# Patient Record
Sex: Male | Born: 1945
Health system: Southern US, Community
[De-identification: ages and names within clinical notes are randomized; demographics above are authoritative.]

## PROBLEM LIST (undated history)

## (undated) DIAGNOSIS — D649 Anemia, unspecified: Secondary | ICD-10-CM

## (undated) DIAGNOSIS — E785 Hyperlipidemia, unspecified: Secondary | ICD-10-CM

## (undated) DIAGNOSIS — I10 Essential (primary) hypertension: Secondary | ICD-10-CM

## (undated) DIAGNOSIS — J449 Chronic obstructive pulmonary disease, unspecified: Secondary | ICD-10-CM

## (undated) DIAGNOSIS — I219 Acute myocardial infarction, unspecified: Secondary | ICD-10-CM

## (undated) DIAGNOSIS — E039 Hypothyroidism, unspecified: Secondary | ICD-10-CM

## (undated) DIAGNOSIS — I2510 Atherosclerotic heart disease of native coronary artery without angina pectoris: Secondary | ICD-10-CM

## (undated) DIAGNOSIS — K219 Gastro-esophageal reflux disease without esophagitis: Secondary | ICD-10-CM

## (undated) DIAGNOSIS — I499 Cardiac arrhythmia, unspecified: Secondary | ICD-10-CM

## (undated) DIAGNOSIS — G4730 Sleep apnea, unspecified: Secondary | ICD-10-CM

## (undated) HISTORY — DX: Anemia, unspecified: D64.9

## (undated) HISTORY — DX: Hyperlipidemia, unspecified: E78.5

## (undated) HISTORY — PX: TONSILLECTOMY: SUR1361

## (undated) HISTORY — DX: Cardiac arrhythmia, unspecified: I49.9

## (undated) HISTORY — DX: Chronic obstructive pulmonary disease, unspecified: J44.9

## (undated) HISTORY — DX: Gastro-esophageal reflux disease without esophagitis: K21.9

## (undated) HISTORY — DX: Atherosclerotic heart disease of native coronary artery without angina pectoris: I25.10

## (undated) HISTORY — PX: APPENDECTOMY: SHX54

## (undated) HISTORY — DX: Acute myocardial infarction, unspecified: I21.9

## (undated) HISTORY — DX: Sleep apnea, unspecified: G47.30

## (undated) HISTORY — PX: EYE SURGERY: SHX253

## (undated) HISTORY — DX: Essential (primary) hypertension: I10

## (undated) HISTORY — DX: Hypothyroidism, unspecified: E03.9

## (undated) HISTORY — PX: CORONARY ANGIOPLASTY WITH STENT PLACEMENT: SHX49

---

## 2004-12-08 ENCOUNTER — Ambulatory Visit: Payer: Self-pay | Admitting: Critical Care Medicine

## 2004-12-25 ENCOUNTER — Ambulatory Visit (HOSPITAL_BASED_OUTPATIENT_CLINIC_OR_DEPARTMENT_OTHER): Admission: RE | Admit: 2004-12-25 | Discharge: 2004-12-25 | Payer: Self-pay | Admitting: Critical Care Medicine

## 2005-01-08 ENCOUNTER — Ambulatory Visit: Payer: Self-pay | Admitting: Pulmonary Disease

## 2005-03-18 ENCOUNTER — Encounter: Admission: RE | Admit: 2005-03-18 | Discharge: 2005-03-18 | Payer: Self-pay | Admitting: Gastroenterology

## 2005-06-24 ENCOUNTER — Ambulatory Visit (HOSPITAL_COMMUNITY): Admission: RE | Admit: 2005-06-24 | Discharge: 2005-06-25 | Payer: Self-pay | Admitting: Otolaryngology

## 2006-01-08 ENCOUNTER — Encounter: Admission: RE | Admit: 2006-01-08 | Discharge: 2006-01-08 | Payer: Self-pay | Admitting: Family Medicine

## 2008-05-15 ENCOUNTER — Ambulatory Visit (HOSPITAL_COMMUNITY): Admission: RE | Admit: 2008-05-15 | Discharge: 2008-05-16 | Payer: Self-pay | Admitting: Ophthalmology

## 2009-08-03 DIAGNOSIS — I2510 Atherosclerotic heart disease of native coronary artery without angina pectoris: Secondary | ICD-10-CM

## 2009-08-03 DIAGNOSIS — I219 Acute myocardial infarction, unspecified: Secondary | ICD-10-CM

## 2009-08-03 HISTORY — DX: Atherosclerotic heart disease of native coronary artery without angina pectoris: I25.10

## 2009-08-03 HISTORY — DX: Acute myocardial infarction, unspecified: I21.9

## 2010-01-13 ENCOUNTER — Inpatient Hospital Stay (HOSPITAL_COMMUNITY): Admission: EM | Admit: 2010-01-13 | Discharge: 2010-01-16 | Payer: Self-pay | Admitting: Emergency Medicine

## 2010-01-30 ENCOUNTER — Ambulatory Visit: Payer: Self-pay | Admitting: Cardiovascular Disease

## 2010-02-10 ENCOUNTER — Ambulatory Visit: Payer: Self-pay | Admitting: Cardiovascular Disease

## 2010-02-26 ENCOUNTER — Encounter: Payer: Self-pay | Admitting: Cardiovascular Disease

## 2010-03-12 ENCOUNTER — Ambulatory Visit: Payer: Self-pay | Admitting: Cardiovascular Disease

## 2010-05-07 ENCOUNTER — Ambulatory Visit: Payer: Self-pay | Admitting: Cardiovascular Disease

## 2010-07-21 ENCOUNTER — Ambulatory Visit: Payer: Self-pay | Admitting: Cardiovascular Disease

## 2010-09-02 NOTE — Miscellaneous (Signed)
Summary: Enrollment  Enrollment   Imported By: Marylou Mccoy 02/26/2010 12:47:04  _____________________________________________________________________  External Attachment:    Type:   Image     Comment:   External Document

## 2010-10-19 LAB — BASIC METABOLIC PANEL
CO2: 30 mEq/L (ref 19–32)
Calcium: 9.1 mg/dL (ref 8.4–10.5)
Calcium: 9.6 mg/dL (ref 8.4–10.5)
Chloride: 101 mEq/L (ref 96–112)
Chloride: 102 mEq/L (ref 96–112)
Creatinine, Ser: 1.07 mg/dL (ref 0.4–1.5)
Creatinine, Ser: 1.09 mg/dL (ref 0.4–1.5)
GFR calc Af Amer: >60 mL/min (ref >60)
GFR calc non Af Amer: >60 mL/min (ref >60)
Glucose, Bld: 103 mg/dL — ABNORMAL HIGH (ref 70–99)
Sodium: 138 mEq/L (ref 135–145)

## 2010-10-19 LAB — CBC
HCT: 40.2 % (ref 39.0–52.0)
HCT: 41.8 % (ref 39.0–52.0)
Hemoglobin: 13.9 g/dL (ref 13.0–17.0)
MCHC: 34.5 g/dL (ref 30.0–36.0)
MCV: 94.3 fL (ref 78.0–100.0)
Platelets: 169 10*3/uL (ref 150–400)
RBC: 4.45 MIL/uL (ref 4.22–5.81)
RDW: 11.7 % (ref 11.5–15.5)
RDW: 11.9 % (ref 11.5–15.5)
WBC: 5.5 10*3/uL (ref 4.0–10.5)
WBC: 5.7 10*3/uL (ref 4.0–10.5)

## 2010-10-20 LAB — URINALYSIS, ROUTINE W REFLEX MICROSCOPIC
Bilirubin Urine: NEGATIVE
Glucose, UA: NEGATIVE mg/dL
Hgb urine dipstick: NEGATIVE
Ketones, ur: NEGATIVE mg/dL
Specific Gravity, Urine: 1.02 (ref 1.005–1.030)

## 2010-10-20 LAB — LIPID PANEL
Cholesterol: 166 mg/dL (ref 0–200)
HDL: 27 mg/dL — ABNORMAL LOW (ref >39)
Total CHOL/HDL Ratio: 6.1 RATIO
Triglycerides: 113 mg/dL (ref <150)

## 2010-10-20 LAB — HEMOGLOBIN A1C: Mean Plasma Glucose: 128 mg/dL — ABNORMAL HIGH (ref <117)

## 2010-10-20 LAB — CARDIAC PANEL(CRET KIN+CKTOT+MB+TROPI)
CK, MB: 6.5 ng/mL (ref 0.3–4.0)
CK, MB: 7.5 ng/mL (ref 0.3–4.0)
Relative Index: 2.5 (ref 0.0–2.5)
Relative Index: 2.6 — ABNORMAL HIGH (ref 0.0–2.5)
Total CK: 261 U/L — ABNORMAL HIGH (ref 7–232)
Troponin I: 4.14 ng/mL (ref 0.00–0.06)

## 2010-10-20 LAB — BASIC METABOLIC PANEL
Calcium: 9.5 mg/dL (ref 8.4–10.5)
Chloride: 103 mEq/L (ref 96–112)
GFR calc Af Amer: >60 mL/min (ref >60)

## 2010-10-20 LAB — CBC
HCT: 44.3 % (ref 39.0–52.0)
MCHC: 34.7 g/dL (ref 30.0–36.0)
MCV: 94.3 fL (ref 78.0–100.0)
RBC: 4.45 MIL/uL (ref 4.22–5.81)
RBC: 4.7 MIL/uL (ref 4.22–5.81)
WBC: 6.1 10*3/uL (ref 4.0–10.5)

## 2010-10-20 LAB — CK TOTAL AND CKMB (NOT AT ARMC)
CK, MB: 9.7 ng/mL (ref 0.3–4.0)
Relative Index: 2.8 — ABNORMAL HIGH (ref 0.0–2.5)

## 2010-10-20 LAB — PROTIME-INR
INR: 1.07 (ref 0.00–1.49)
Prothrombin Time: 13.8 seconds (ref 11.6–15.2)

## 2010-10-20 LAB — DIFFERENTIAL
Basophils Relative: 1 % (ref 0–1)
Eosinophils Absolute: 0.2 10*3/uL (ref 0.0–0.7)
Lymphocytes Relative: 21 % (ref 12–46)
Lymphs Abs: 1.3 10*3/uL (ref 0.7–4.0)
Neutro Abs: 3.8 10*3/uL (ref 1.7–7.7)

## 2010-10-20 LAB — TSH: TSH: 4.096 u[IU]/mL (ref 0.350–4.500)

## 2010-10-20 LAB — POCT I-STAT, CHEM 8
BUN: 20 mg/dL (ref 6–23)
Calcium, Ion: 1.15 mmol/L (ref 1.12–1.32)
Chloride: 104 mEq/L (ref 96–112)
Glucose, Bld: 107 mg/dL — ABNORMAL HIGH (ref 70–99)
HCT: 45 % (ref 39.0–52.0)
Sodium: 137 mEq/L (ref 135–145)

## 2010-10-20 LAB — APTT: aPTT: 33 seconds (ref 24–37)

## 2010-10-20 LAB — TROPONIN I: Troponin I: 2.66 ng/mL (ref 0.00–0.06)

## 2010-10-20 LAB — POCT CARDIAC MARKERS
CKMB, poc: 7.4 ng/mL (ref 1.0–8.0)
Troponin i, poc: 1.05 ng/mL (ref 0.00–0.09)

## 2010-10-20 LAB — BRAIN NATRIURETIC PEPTIDE: Pro B Natriuretic peptide (BNP): 119 pg/mL — ABNORMAL HIGH (ref 0.0–100.0)

## 2010-12-16 NOTE — Op Note (Signed)
Kyle [name], Kyle [name]               ACCOUNT NO.:  [id number]   MEDICAL RECORD NO.:  [id number]          PATIENT TYPE:  AMB   LOCATION:  SDS                          FACILITY:  [facility]   PHYSICIAN:  John D. Ashley Royalty, M.D. DATE OF BIRTH:  [birth year]   DATE OF PROCEDURE:  05/15/2008  DATE OF DISCHARGE:                               OPERATIVE REPORT   ADMISSION DIAGNOSES:  Recurrent rhegmatogenous retinal detachment, left  eye.  Also, proliferative vitreoretinopathy.   PROCEDURES:  Pars plana vitrectomy with membrane peel, Perfluoron  placement, retinal photocoagulation, Perfluoron removal, gas fluid  exchange, perfluoropropane injection, left eye.   SURGEON:  Beulah Gandy. Ashley Royalty, MD   ASSISTANT:  Rosalie Doctor, MA.   ANESTHESIA:  General.   DETAILS:  Usual Prep and drape.  Peritomies at 10, 2, and 4 o'clock.  A  5-mm infusion port placed at 4 o'clock.  The lighted pick and the cutter  were placed at 10 and 2 o'clock respectively.  Three layered sclerotomy  wounds were made at 10, 2, and 4 o'clock.  Provisc was placed on the  corneal surface.  Pars plana vitrectomy was begun just behind the  pseudophakos.  The dense membranes were encountered and removed under  low suction and rapid cutting.  The vitrectomy was carried out to the  periphery where the vitreous base was trimmed.  The vitrectomy was  carried down to the macular surface where membranes were peeled from  their attachments to the retina.  Once the retina was found to be free,  Perfluoron was placed for full reattachment of the retina.  Four rows of  endolaser were placed from 12 o'clock to 6 o'clock on the posterior  aspect of the buckle.  The 1060 burns were placed, power was 1500  milliwatts, 1000 microns each and 0.1 seconds each.  Perfluoron was  removed.  A washout procedure was performed with BSS so that all  Perfluoron was removed.  Perfluoropropane in a 14% concentration was  exchanged for intravitreal gas.  The  instruments were removed from the  eye and 9-0 nylon was used to close the sclerotomy sites.  Pressure was  adjusted to 10 with a Baer keratometer with additional perfluoropropane  injection.  The wounds were tested and found to be tight.  The  conjunctiva was closed with wet-field cautery.  Polymyxin and gentamicin  were irrigated 15 ounces space.  Atropine solution was applied.  Decadron 10 mg was injected to the lower subconjunctival space.  Marcaine was injected around the globe for postop pain.  Polysporin  ophthalmic ointment, a patch and shield were placed.  Closing pressure  was 10 with a Risk manager.   COMPLICATIONS:  None.   DURATION:  45 minutes.   The patient was awakened and taken to recovery in satisfactory  condition.      Beulah Gandy. Ashley Royalty, M.D.  Electronically Signed     JDM/MEDQ  D:  05/15/2008  T:  05/16/2008  Job:  [id number]

## 2010-12-19 NOTE — Procedures (Signed)
[name], [name] NO.:  [id number]   MEDICAL RECORD NO.:  [id number]          PATIENT TYPE:  OUT   LOCATION:  SLEEP CENTER                 FACILITY:  [facility]   PHYSICIAN:  Marcelyn Bruins, M.D. [facility] DATE OF BIRTH:  August 16, 1945   DATE OF STUDY:  12/25/2004                              NOCTURNAL POLYSOMNOGRAM   REFERRING PHYSICIAN:  Shan Levans, M.D.   INDICATION FOR THE STUDY:  Hypersomnia with sleep apnea. Epworth score was  10.   SLEEP ARCHITECTURE:  The patient had a total sleep time of 408 minutes with  very diminished slow wave sleep, but adequate REM. Sleep onset latency was  normal and REM latency was mildly prolonged. Sleep efficiency was 86%.   IMPRESSION:  1.  Severe obstructive sleep apnea/hypopnea syndrome with a respiratory      disturbance index of 46 events per hour and O2 desaturation as low as      86%. The events were clearly worse in the supine position. The patient      did have some documented central apneas, however, most of these scored      were in actuality mixed apneas.  2.  Moderate to severe snoring noted throughout the study.  3.  The patient occasionally had 5-beat runs of wide complex tachycardia      that were not associated with obstructive events. Otherwise there were      no clinically significant cardiac arrhythmias.     ______________________________  Suzzette Righter    KC/MEDQ  D:  01/08/2005 11:39:35  T:  01/08/2005 12:05:42  Job:  [id number]

## 2010-12-19 NOTE — Op Note (Signed)
NAMEADARIUS, TIGGES NO.:  [id number]   MEDICAL RECORD NO.:  [id number]          PATIENT TYPE:  OIB   LOCATION:  5742                         FACILITY:  [facility]   PHYSICIAN:  Beulah Gandy. Ashley Royalty, M.D. DATE OF BIRTH:  April 09, 1946   DATE OF PROCEDURE:  06/24/2005  DATE OF DISCHARGE:                                 OPERATIVE REPORT   ADMISSION DIAGNOSIS:  Rhegmatogenous retinal detachment in the left eye.   PROCEDURES:  1.  Scleral buckle, left eye.  2.  Retinal photocoagulation, left eye.   SURGEON:  Beulah Gandy. Ashley Royalty, M.D.   ASSISTANT:  Ruthann Cancer, R.N.   ANESTHESIA:  General.   DETAILS:  Usual prep and drape, 360-degree limbal peritomy, isolation of 4  rectus muscles on 2-0 silk, scleral dissection for 360 degrees, diathermy  placed in the bed, 2 sutures per quadrant placed in the scleral flaps for a  total of 8 sutures, perforation site chosen at 2:30 o'clock in the posterior  aspect of the bed; a large amount of clear colorless subretinal fluid came  forth in a controlled manner.  Pigment granules came forth at the end of the  drainage.  The scleral sutures were drawn securely.  A 279 implant was  placed around the eye with 1 mm trimmed from the anterior edge.  A 240 band  was placed around the eye with a 270 sleeve at 10 o'clock.  The buckle was  jointed at 10 o'clock.  The buckle was drawn securely and knotted.  Indirect  ophthalmoscopy showed the retina to be lying nicely on the scleral buckle  with no subretinal fluid remaining.  The indirect ophthalmoscope laser was  moved into place and 716 burns were placed around the retinal periphery on  the scleral buckle; the power was 460 milliwatts, 1000 microns each and 0.1  seconds each.  The band was adjusted and trimmed.  The buckle was adjusted  and trimmed.  The sutures were knotted and the free ends trimmed.  The  conjunctiva was reapproximated with 7-0 chromic suture.  Polymyxin and  gentamicin were  irrigated into Tenon's space.  No atropine solution was  used.  Decadron 10 mg was injected into the lower subconjunctival space.  Marcaine was injected around the globe for postop pain.  TobraDex ophthalmic  ointment was placed.  Closing pressure was 10 with a [place] keratometer.  A patch and shield were placed.  The patient was awakened and taken to  recovery in satisfactory condition.   COMPLICATIONS:  None.   DURATION:  Two hours.      Beulah Gandy. Ashley Royalty, M.D.  Electronically Signed    JDM/MEDQ  D:  06/24/2005  T:  06/25/2005  Job:  [id number]

## 2011-02-19 ENCOUNTER — Encounter (INDEPENDENT_AMBULATORY_CARE_PROVIDER_SITE_OTHER): Payer: Self-pay

## 2011-02-19 DIAGNOSIS — R0989 Other specified symptoms and signs involving the circulatory and respiratory systems: Secondary | ICD-10-CM

## 2011-05-04 LAB — BASIC METABOLIC PANEL
CO2: 27
Calcium: 10.5
Creatinine, Ser: 0.95
GFR calc Af Amer: >60
GFR calc non Af Amer: >60
Glucose, Bld: 98

## 2011-05-04 LAB — CBC
MCHC: 33.6
RDW: 12.2

## 2011-12-04 ENCOUNTER — Ambulatory Visit: Payer: Self-pay | Admitting: Family Medicine

## 2011-12-04 DIAGNOSIS — Z0289 Encounter for other administrative examinations: Secondary | ICD-10-CM

## 2011-12-11 ENCOUNTER — Ambulatory Visit (INDEPENDENT_AMBULATORY_CARE_PROVIDER_SITE_OTHER): Payer: BC Managed Care – PPO | Admitting: Family Medicine

## 2011-12-11 ENCOUNTER — Encounter: Payer: Self-pay | Admitting: Family Medicine

## 2011-12-11 VITALS — BP 122/72 | HR 80 | Temp 97.8°F | Resp 12 | Ht 71.0 in | Wt 232.0 lb

## 2011-12-11 DIAGNOSIS — I10 Essential (primary) hypertension: Secondary | ICD-10-CM | POA: Insufficient documentation

## 2011-12-11 DIAGNOSIS — E785 Hyperlipidemia, unspecified: Secondary | ICD-10-CM | POA: Insufficient documentation

## 2011-12-11 DIAGNOSIS — I2510 Atherosclerotic heart disease of native coronary artery without angina pectoris: Secondary | ICD-10-CM | POA: Insufficient documentation

## 2011-12-11 LAB — LIPID PANEL
Cholesterol: 98 mg/dL (ref 0–200)
Triglycerides: 92 mg/dL (ref 0.0–149.0)

## 2011-12-11 LAB — HEPATIC FUNCTION PANEL
ALT: 23 U/L (ref 0–53)
AST: 27 U/L (ref 0–37)
Albumin: 4.1 g/dL (ref 3.5–5.2)
Alkaline Phosphatase: 51 U/L (ref 39–117)
Total Protein: 7.2 g/dL (ref 6.0–8.3)

## 2011-12-11 LAB — BASIC METABOLIC PANEL
Calcium: 9.5 mg/dL (ref 8.4–10.5)
Creatinine, Ser: 1.1 mg/dL (ref 0.4–1.5)
GFR: 72.94 mL/min (ref >60.00)

## 2011-12-11 NOTE — Progress Notes (Signed)
  Subjective:    Patient ID: Kyle [name], male    DOB: [birth year], 66 y.o.   MRN: [id number]  HPI  New patient to establish care. Patient was basically doing well with no chronic problems until about 2 years ago when he developed chest pain. Reportedly had cardiac stent placed 2011. Ongoing followup with cardiology since then. Also treated for hypertension and hyperlipidemia. Medications reviewed. Currently takes ACE inhibitor and statin along with baby aspirin. Had been on metoprolol for about one year following his MI but currently off. No recent chest pain. He is an ex-smoker previously up to 4 packs per day. Denies any recent progressive dyspnea. No orthopnea. He's had some dry cough for the past week the wife has similar type cough. No hemoptysis.  Patient recently married. Works as a Nutritional therapist. Ex-smoker. Occasional alcohol use. Family history significant for both parents with history of hypertension and stroke.  Past Medical History  Diagnosis Date  . GERD (gastroesophageal reflux disease)   . Cardiac arrhythmia   . Hyperlipidemia   . Hypertension    Past Surgical History  Procedure Date  . Appendectomy   . Eye surgery     lasix    reports that he quit smoking about 28 years ago. His smoking use included Cigarettes. He has a 40 pack-year smoking history. He does not have any smokeless tobacco history on file. His alcohol and drug histories not on file. family history includes Stroke in his father and mother. No Known Allergies    Review of Systems  Constitutional: Negative for fever, chills and unexpected weight change.  Eyes: Negative for visual disturbance.  Respiratory: Positive for shortness of breath (Chronic).   Cardiovascular: Negative for chest pain, palpitations and leg swelling.  Gastrointestinal: Negative for nausea, vomiting, abdominal pain and blood in stool.  Genitourinary: Negative for dysuria.  Neurological: Negative for dizziness and syncope.    Hematological: Negative for adenopathy. Does not bruise/bleed easily.  Psychiatric/Behavioral: Negative for confusion.       Objective:   Physical Exam  Constitutional: He appears well-developed and well-nourished. No distress.  HENT:  Right Ear: External ear normal.  Left Ear: External ear normal.  Mouth/Throat: Oropharynx is clear and moist.  Neck: Neck supple. No thyromegaly present.  Cardiovascular: Normal rate and regular rhythm.   Pulmonary/Chest: Effort normal and breath sounds normal. No respiratory distress. He has no wheezes. He has no rales.  Lymphadenopathy:    He has no cervical adenopathy.          Assessment & Plan:  #1 history of coronary artery disease. Recheck lipid and hepatic panel  #2 hypertension well controlled. Check basic metabolic panel  #3 dyslipidemia  #4 history of GERD  #5 ex-smoker. Probable COPD. Discussed further evaluation including spirometry this point he wishes to wait

## 2011-12-14 NOTE — Progress Notes (Signed)
Quick Note:  Pt informed ______ 

## 2011-12-17 ENCOUNTER — Telehealth: Payer: Self-pay | Admitting: *Deleted

## 2011-12-17 NOTE — Telephone Encounter (Signed)
Notified pt can take Tessalon.

## 2011-12-17 NOTE — Telephone Encounter (Signed)
Pt is having a dry and hacky cough x several weeks now.  His wife had it first and needs RX.  She is taking Lawyer and if this is ok, he will try that .

## 2011-12-17 NOTE — Telephone Encounter (Signed)
Okay to try [employer]

## 2012-02-26 ENCOUNTER — Encounter (INDEPENDENT_AMBULATORY_CARE_PROVIDER_SITE_OTHER): Payer: Medicare Other

## 2012-02-26 DIAGNOSIS — R0989 Other specified symptoms and signs involving the circulatory and respiratory systems: Secondary | ICD-10-CM

## 2012-05-17 ENCOUNTER — Other Ambulatory Visit (INDEPENDENT_AMBULATORY_CARE_PROVIDER_SITE_OTHER): Payer: Medicare Other | Admitting: *Deleted

## 2012-05-17 ENCOUNTER — Ambulatory Visit (INDEPENDENT_AMBULATORY_CARE_PROVIDER_SITE_OTHER): Payer: Medicare Other

## 2012-05-17 DIAGNOSIS — Z23 Encounter for immunization: Secondary | ICD-10-CM

## 2012-06-14 ENCOUNTER — Ambulatory Visit: Payer: BC Managed Care – PPO | Admitting: Family Medicine

## 2012-07-06 ENCOUNTER — Ambulatory Visit: Payer: Self-pay | Admitting: Family Medicine

## 2012-10-11 ENCOUNTER — Ambulatory Visit (INDEPENDENT_AMBULATORY_CARE_PROVIDER_SITE_OTHER): Payer: Medicare Other | Admitting: Family

## 2012-10-11 ENCOUNTER — Encounter: Payer: Self-pay | Admitting: Family

## 2012-10-11 ENCOUNTER — Ambulatory Visit (INDEPENDENT_AMBULATORY_CARE_PROVIDER_SITE_OTHER)
Admission: RE | Admit: 2012-10-11 | Discharge: 2012-10-11 | Disposition: A | Discharge status: Home / Self Care | Payer: Medicare Other | Source: Ambulatory Visit | Attending: Family | Admitting: Family

## 2012-10-11 VITALS — BP 132/88 | HR 75 | Temp 97.3°F | Wt 229.0 lb

## 2012-10-11 DIAGNOSIS — R05 Cough: Secondary | ICD-10-CM

## 2012-10-11 DIAGNOSIS — R059 Cough, unspecified: Secondary | ICD-10-CM

## 2012-10-11 DIAGNOSIS — J209 Acute bronchitis, unspecified: Secondary | ICD-10-CM

## 2012-10-11 MED ORDER — PREDNISONE 20 MG PO TABS: ORAL_TABLET | ORAL | Status: AC

## 2012-10-11 NOTE — Patient Instructions (Signed)

## 2012-10-11 NOTE — Progress Notes (Signed)
Subjective:    Patient ID: Kyle [name], male    DOB: Jul 22, 1946, 67 y.o.   MRN: [id number]  HPI 67 year old white male, nonsmoker, patient of Dr. Caryl Never is in today with complaints of cough, chest congestion x2 weeks. Reports mild occasional shortness of breath. Has been taking Robitussin that he believes makes his symptoms worse. Reports having a wheezing particularly at night. Denies any fever. Denies any heartburn or indigestion. If stable on blood pressure medication.   Review of Systems  Constitutional: Negative.   HENT: Negative.   Respiratory: Positive for cough, shortness of breath and wheezing.   Cardiovascular: Negative.  Negative for chest pain, palpitations and leg swelling.  Gastrointestinal: Negative.  Negative for abdominal pain.  Genitourinary: Negative.   Musculoskeletal: Negative.   Skin: Negative.   Allergic/Immunologic: Negative.   Neurological: Negative.   Hematological: Negative.   Psychiatric/Behavioral: Negative.    Past Medical History  Diagnosis Date  . GERD (gastroesophageal reflux disease)   . Cardiac arrhythmia   . Hyperlipidemia   . Hypertension   . CAD (coronary artery disease) 2011    History   Social History  . Marital Status: Married    Spouse Name: N/A    Number of Children: N/A  . Years of Education: N/A   Occupational History  . Not on file.   Social History Main Topics  . Smoking status: Former Smoker -- 2.00 packs/day for 20 years    Types: Cigarettes    Quit date: 12/11/1983  . Smokeless tobacco: Not on file  . Alcohol Use: Not on file  . Drug Use: Not on file  . Sexually Active: Not on file   Other Topics Concern  . Not on file   Social History Narrative  . No narrative on file    Past Surgical History  Procedure Laterality Date  . Appendectomy    . Eye surgery      lasix    Family History  Problem Relation Age of Onset  . Stroke Mother   . Stroke Father     No Known Allergies  Current Outpatient  Prescriptions on File Prior to Visit  Medication Sig Dispense Refill  . aspirin 81 MG tablet Take 81 mg by mouth daily.      . benazepril-hydrochlorthiazide (LOTENSIN HCT) 20-25 MG per tablet Take 1 tablet by mouth daily.      . cetirizine (ZYRTEC) 10 MG tablet Take 10 mg by mouth daily.      . metoprolol (LOPRESSOR) 50 MG tablet Take 50 mg by mouth 2 (two) times daily.      Marland Kitchen omeprazole (PRILOSEC OTC) 20 MG tablet Take 20 mg by mouth daily.      . simvastatin (ZOCOR) 40 MG tablet Take 40 mg by mouth every evening.       No current facility-administered medications on file prior to visit.    BP 132/88  Pulse 75  Temp(Src) 97.3 F (36.3 C) (Oral)  Wt 229 lb (103.874 kg)  BMI 31.95 kg/m2  SpO2 95%chart    Objective:   Physical Exam  Constitutional: He is oriented to person, place, and time. He appears well-developed and well-nourished.  HENT:  Right Ear: External ear normal.  Left Ear: External ear normal.  Nose: Nose normal.  Mouth/Throat: Oropharynx is clear and moist.  Neck: Normal range of motion. Neck supple.  Cardiovascular: Normal rate, regular rhythm and normal heart sounds.   Pulmonary/Chest: Effort normal and breath sounds normal. He has no wheezes.  Abdominal: Soft. Bowel sounds are normal.  Musculoskeletal: Normal range of motion.  Neurological: He is alert and oriented to person, place, and time.  Skin: Skin is warm and dry.  Psychiatric: He has a normal mood and affect.          Assessment & Plan:  Assessment:  1. Acute bronchitis 2. Cough 3. Hypertension  Plan: Prednisone 60x3, 40x3, 20x3. Rest. Drink plenty of fluids. Patient call the office if his symptoms worsen or persist. Recheck a schedule, and as needed.

## 2012-10-13 ENCOUNTER — Telehealth: Payer: Self-pay | Admitting: Family Medicine

## 2012-10-13 MED ORDER — ALBUTEROL SULFATE HFA 108 (90 BASE) MCG/ACT IN AERS: 2.0000 | INHALATION_SPRAY | Freq: Four times a day (QID) | RESPIRATORY_TRACT | Status: DC | PRN

## 2012-10-13 NOTE — Telephone Encounter (Signed)
Wife [name], [phone].   states her husband was in the office this week with bronchitis and placed on a Prednisone Taper.  The office called yesterday 10/12/12 with his x-ray results and told patient they were calling him in an inhaler.  Wife states medication is not at their pharmacy [address] [place] ([address]) [phone].  Reviewed Epic. Note on x-ray imaging reflects inhaler to be called.  No medication (Inhaler) on patient medication list.  Please follow up with patient concerning medication .  MEDICATION NOT AT PHARMACY

## 2012-10-13 NOTE — Telephone Encounter (Signed)
Pt saw Padonda on 3/11, please see CXR notes and advise.

## 2012-10-13 NOTE — Telephone Encounter (Signed)
Lets call in ProAir 2 puffs every 6 hours prn cough and wheeze and needs follow up next few days if not improving.

## 2012-10-13 NOTE — Telephone Encounter (Signed)
Pt wife informed Rx sent.

## 2012-10-20 ENCOUNTER — Encounter: Payer: Self-pay | Admitting: Family Medicine

## 2012-10-20 ENCOUNTER — Ambulatory Visit (INDEPENDENT_AMBULATORY_CARE_PROVIDER_SITE_OTHER): Payer: Medicare Other | Admitting: Family Medicine

## 2012-10-20 VITALS — BP 112/50 | Temp 98.4°F | Wt 227.0 lb

## 2012-10-20 DIAGNOSIS — B029 Zoster without complications: Secondary | ICD-10-CM

## 2012-10-20 MED ORDER — VALACYCLOVIR HCL 1 G PO TABS: 1000.0000 mg | ORAL_TABLET | Freq: Three times a day (TID) | ORAL | Status: DC

## 2012-10-20 NOTE — Progress Notes (Signed)
  Subjective:    Patient ID: Kyle [name], male    DOB: [birth year], 67 y.o.   MRN: [id number]  HPI Acute visit. Onset 2 days ago of throbbing pain left frontal region. Sometime over the past day or 2 he developed a blistery rash left forehead Rash spreading toward the left eye. He denies any blurred vision. No eye pain. Pain is relatively minimal.  Past Medical History  Diagnosis Date  . GERD (gastroesophageal reflux disease)   . Cardiac arrhythmia   . Hyperlipidemia   . Hypertension   . CAD (coronary artery disease) 2011   Past Surgical History  Procedure Laterality Date  . Appendectomy    . Eye surgery      lasix    reports that he quit smoking about 28 years ago. His smoking use included Cigarettes. He has a 40 pack-year smoking history. He does not have any smokeless tobacco history on file. His alcohol and drug histories are not on file. family history includes Stroke in his father and mother. No Known Allergies     Review of Systems  Constitutional: Negative for fever and chills (Classic).  Eyes: Negative for photophobia, pain, discharge, redness and visual disturbance.  Respiratory: Negative for shortness of breath.   Neurological: Negative for headaches.       Objective:   Physical Exam  Vitals reviewed. Constitutional: He appears well-developed and well-nourished.  Eyes: Pupils are equal, round, and reactive to light.  Cornea normal. Conjunctiva appears normal.  Cardiovascular: Normal rate and regular rhythm.   Pulmonary/Chest: Effort normal and breath sounds normal. No respiratory distress. He has no wheezes.  Skin:  Patient has vesicular rash patch-like distribution left forehead. He has a couple smaller lesions around the bridge of the nose. No crusting. No pustules.          Assessment & Plan:  Shingles in first trigeminal distribution. No evidence for actual eye involvement though he does have some involvement left upper lid region.. Valtrex 1 g  3 times a day for 7 days. Follow up immediately for any visual changes or eye pain or redness

## 2012-10-20 NOTE — Patient Instructions (Signed)

## 2012-10-24 ENCOUNTER — Telehealth: Payer: Self-pay | Admitting: Family Medicine

## 2012-10-24 MED ORDER — HYDROCODONE-ACETAMINOPHEN 5-325 MG PO TABS
1.0000 | ORAL_TABLET | Freq: Four times a day (QID) | ORAL | Status: DC | PRN
Start: 2012-10-24 — End: 2013-11-23

## 2012-10-24 NOTE — Telephone Encounter (Signed)
Please advise 

## 2012-10-24 NOTE — Telephone Encounter (Signed)
Vicodin 5/325 mg 1-2 po q 6 hours prn pain.  Let's try to get appointment for him tomorrow if possible

## 2012-10-24 NOTE — Telephone Encounter (Signed)
Patient Information:  Caller Name: Kyle [name]  Phone: [phone]  Patient: Kyle [name], Kyle [name]  Gender: Male  DOB: 1946/03/18  Age: 67 Years  PCP: Kyle [name] (Family Practice)  Office Follow Up:  Does the office need to follow up with this patient?: Yes  Instructions For The Office: Please follow up with patient regarding shingles non improvement, worsening in amount of lesions and pain.  RN Note:  Patient started Valtrex treatment on 10/18/12 after seeing provider.  Shingles continue to spread with a constant low level pain affecting patient's ability to sleep and eat.  Rates intensity as a 3-4/10 scale but is unrelenting.  Lesions have spread all over scalpel, trailing down nose with right eye swollen shut and worse now than this morning.  Was seen by an Opthalmologist 10/23/12 and told that no internal areas of the eye is involved.  Wife calls because she is concerned-having never seen a case this bad, no relief with treatment and its affects on husband's rest and ability to function.  Triaged using [facility]'s skin lesions with a disposition to be seen within 4 hours due to eye and nose involvement and increasing pain.  No appointment available-patient is hoping that there is something that can be added to treatment to help clear them up or something can be given for pain.  Drug store of choice is [facility] on [address]  Symptoms  Reason For Call & Symptoms: Shingles  Reviewed Health History In EMR: Yes  Reviewed Medications In EMR: Yes  Reviewed Allergies In EMR: Yes  Reviewed Surgeries / Procedures: Yes  Date of Onset of Symptoms: 10/18/2012  Treatments Tried: Prescription is not helping  Treatments Tried Worked: No  Guideline(s) Used:  No Protocol Available - Sick Adult  Disposition Per Guideline:   Discuss with PCP and Callback by Nurse Today  Reason For Disposition Reached:   Nursing judgment  Advice Given:  Call Back If:  New symptoms develop  You become  worse.  Patient Will Follow Care Advice:  YES

## 2012-10-25 ENCOUNTER — Encounter: Payer: Self-pay | Admitting: Family Medicine

## 2012-10-25 ENCOUNTER — Ambulatory Visit (INDEPENDENT_AMBULATORY_CARE_PROVIDER_SITE_OTHER): Payer: Medicare Other | Admitting: Family Medicine

## 2012-10-25 VITALS — BP 132/82 | Temp 98.2°F

## 2012-10-25 DIAGNOSIS — B0239 Other herpes zoster eye disease: Secondary | ICD-10-CM

## 2012-10-25 DIAGNOSIS — B0230 Zoster ocular disease, unspecified: Secondary | ICD-10-CM

## 2012-10-25 NOTE — Patient Instructions (Addendum)
Shingles Shingles is caused by the same virus that causes chickenpox (varicella zoster virus or VZV). Shingles often occurs many years or decades after having chickenpox. That is why it is more common in adults older than 50 years. The virus reactivates and breaks out as an infection in a nerve root. SYMPTOMS   The initial feeling (sensations) may be pain. This pain is usually described as:  Burning.  Stabbing.  Throbbing.  Tingling in the nerve root.  A red rash will follow in a couple days. The rash may occur in any area of the body and is usually on one side (unilateral) of the body in a band or belt-like pattern. The rash usually starts out as very small blisters (vesicles). They will dry up after 7 to 10 days. This is not usually a significant problem except for the pain it causes.  Long-lasting (chronic) pain is more likely in an elderly person. It can last months to years. This condition is called postherpetic neuralgia. Shingles can be an extremely severe infection in someone with AIDS, a weakened immune system, or with forms of leukemia. It can also be severe if you are taking transplant medicines or other medicines that weaken the immune system. TREATMENT  Your caregiver will often treat you with:  Antiviral drugs.  Anti-inflammatory drugs.  Pain medicines. Bed rest is very important in preventing the pain associated with herpes zoster (postherpetic neuralgia). Application of heat in the form of a hot water bottle or electric heating pad or gentle pressure with the hand is recommended to help with the pain or discomfort. PREVENTION  A varicella zoster vaccine is available to help protect against the virus. The Food and Drug Administration approved the varicella zoster vaccine for individuals 39 years of age and older. HOME CARE INSTRUCTIONS   Cool compresses to the area of rash may be helpful.  Only take over-the-counter or prescription medicines for pain, discomfort, or  fever as directed by your caregiver.  Avoid contact with:  Babies.  Pregnant women.  Children with eczema.  Elderly people with transplants.  People with chronic illnesses, such as leukemia and AIDS.  If the area involved is on your face, you may receive a referral for follow-up to a specialist. It is very important to keep all follow-up appointments. This will help avoid eye complications, chronic pain, or disability. SEEK IMMEDIATE MEDICAL CARE IF:   You develop any pain (headache) in the area of the face or eye. This must be followed carefully by your caregiver or ophthalmologist. An infection in part of your eye (cornea) can be very serious. It could lead to blindness.  You do not have pain relief from prescribed medicines.  Your redness or swelling spreads.  The area involved becomes very swollen and painful.  You have a fever.  You notice any red or painful lines extending away from the affected area toward your heart (lymphangitis).  Your condition is worsening or has changed. Document Released: 07/20/2005 Document Revised: 10/12/2011 Document Reviewed: 06/24/2009 [facility] Patient Information 2013 [place], [place].  Follow up immediately for any blurred vision or increased eye pain.

## 2012-10-25 NOTE — Progress Notes (Signed)
  Subjective:    Patient ID: Kyle [name], male    DOB: 1946/05/05, 67 y.o.   MRN: [id number]  HPI Followup zoster ophthalmicus left eye Onset around 10-18-12. Pain has been relatively mild. He was seen here on 10-20-12 and started on Valtrex. Subsequently had some periorbital swelling and saw ophthalmologist on 10/23/12. No evidence for eye involvement. No additional medications. Cough yesterday with mild pain. 4/10 severity  Hydrocodone prescribed. Denies headaches. No blurred vision. No eye pain. Schedule followup with ophthalmologist this Friday  Review of Systems  Constitutional: Negative for fever and chills.  HENT: Negative for ear pain.   Eyes: Positive for discharge (only clear watering). Negative for pain, itching and visual disturbance.  Neurological: Negative for headaches.       Objective:   Physical Exam  Constitutional: He is oriented to person, place, and time. He appears well-developed and well-nourished.  HENT:  Right Ear: External ear normal.  Left Ear: External ear normal.  Eyes:  Patient has some periorbital edema left lower lid mostly. No cellulitis changes. Conjunctiva normal. He has mild irregularity of left pupil from remote surgery. Cornea appears normal. no purulent secretions  Neurological: He is alert and oriented to person, place, and time. No cranial nerve deficit.  Skin:  Patient has erythematous rash with some scattered vesicles which are drying left femoral head extending into left side of face and bridge of nose          Assessment & Plan:  Zoster ophthalmicus. Patient has some periorbital swelling on the left but no evidence for cellulitis and no evidence for actual eye involvement. Has already seen ophthalmologist as above. Continue Valtrex. Continue hydrocodone for pain relief. Followup immediately for any blurred vision or eye pain

## 2012-10-25 NOTE — Telephone Encounter (Signed)
Rx called in, wife informed and we scheduled OV for tomorrow.

## 2012-11-23 ENCOUNTER — Ambulatory Visit (INDEPENDENT_AMBULATORY_CARE_PROVIDER_SITE_OTHER): Payer: Medicare Other | Admitting: Family Medicine

## 2012-11-23 ENCOUNTER — Encounter: Payer: Self-pay | Admitting: Family Medicine

## 2012-11-23 VITALS — BP 102/68 | Temp 97.4°F | Wt 224.0 lb

## 2012-11-23 DIAGNOSIS — R059 Cough, unspecified: Secondary | ICD-10-CM

## 2012-11-23 DIAGNOSIS — R05 Cough: Secondary | ICD-10-CM

## 2012-11-23 DIAGNOSIS — J309 Allergic rhinitis, unspecified: Secondary | ICD-10-CM

## 2012-11-23 MED ORDER — MONTELUKAST SODIUM 10 MG PO TABS: 10.0000 mg | ORAL_TABLET | Freq: Every day | ORAL | Status: DC

## 2012-11-23 NOTE — Patient Instructions (Addendum)
If exertional shortness of breath persists, need to follow up with cardiologist.

## 2012-11-23 NOTE — Progress Notes (Signed)
  Subjective:    Patient ID: Kyle [name], male    DOB: 02/17/1946, 67 y.o.   MRN: [id number]  HPI Acute visit. Patient is an ex-smoker seen with cough. Cough has been going on for a few weeks. He thinks is related to recent allergy issues. Frequent nasal congestion and postnasal drip symptoms. Already taking Zyrtec. No relief with nasal sprays previously. No fevers or chills. He has some exertional dyspnea. Not sure of any wheezing. Denies any recent chest pains. He sees cardiologist regularly. Reported stress test about 6-7 months ago unremarkable.  Past Medical History  Diagnosis Date  . GERD (gastroesophageal reflux disease)   . Cardiac arrhythmia   . Hyperlipidemia   . Hypertension   . CAD (coronary artery disease) 2011   Past Surgical History  Procedure Laterality Date  . Appendectomy    . Eye surgery      lasix    reports that he quit smoking about 28 years ago. His smoking use included Cigarettes. He has a 40 pack-year smoking history. He does not have any smokeless tobacco history on file. His alcohol and drug histories are not on file. family history includes Stroke in his father and mother. No Known Allergies    Review of Systems  Constitutional: Positive for fatigue. Negative for fever and chills.  HENT: Positive for congestion and postnasal drip.   Respiratory: Positive for cough and shortness of breath.   Cardiovascular: Negative for chest pain, palpitations and leg swelling.       Objective:   Physical Exam  Constitutional: He appears well-developed and well-nourished.  HENT:  Right Ear: External ear normal.  Left Ear: External ear normal.  Mouth/Throat: Oropharynx is clear and moist.  Neck: Neck supple. No thyromegaly present.  Cardiovascular: Normal rate and regular rhythm.   Pulmonary/Chest: Effort normal and breath sounds normal. No respiratory distress. He has no wheezes. He has no rales.          Assessment & Plan:  Rhinitis and cough.  Suspect allergic basis. Continue Zyrtec. Trial of Singulair 10 mg once daily. Followup promptly with cardiologist if he has any worsening shortness of breath or any new symptoms such as chest pain

## 2013-03-12 ENCOUNTER — Other Ambulatory Visit: Payer: Self-pay | Admitting: Family Medicine

## 2013-04-13 ENCOUNTER — Ambulatory Visit (INDEPENDENT_AMBULATORY_CARE_PROVIDER_SITE_OTHER): Payer: Medicare Other | Admitting: Family Medicine

## 2013-04-13 DIAGNOSIS — Z23 Encounter for immunization: Secondary | ICD-10-CM

## 2013-04-17 ENCOUNTER — Ambulatory Visit: Payer: Medicare Other | Admitting: Family Medicine

## 2013-05-11 ENCOUNTER — Other Ambulatory Visit: Payer: Self-pay | Admitting: Cardiology

## 2013-06-09 ENCOUNTER — Other Ambulatory Visit (INDEPENDENT_AMBULATORY_CARE_PROVIDER_SITE_OTHER): Payer: Medicare Other

## 2013-06-09 DIAGNOSIS — Z125 Encounter for screening for malignant neoplasm of prostate: Secondary | ICD-10-CM

## 2013-06-09 DIAGNOSIS — Z0000 Encounter for general adult medical examination without abnormal findings: Secondary | ICD-10-CM

## 2013-06-09 DIAGNOSIS — I10 Essential (primary) hypertension: Secondary | ICD-10-CM

## 2013-06-09 DIAGNOSIS — E785 Hyperlipidemia, unspecified: Secondary | ICD-10-CM

## 2013-06-09 LAB — LIPID PANEL
Cholesterol: 102 mg/dL (ref 0–200)
LDL Cholesterol: 59 mg/dL (ref 0–99)
VLDL: 17.2 mg/dL (ref 0.0–40.0)

## 2013-06-09 LAB — CBC WITH DIFFERENTIAL/PLATELET
Eosinophils Relative: 4 % (ref 0.0–5.0)
HCT: 37.1 % — ABNORMAL LOW (ref 39.0–52.0)
Hemoglobin: 12.5 g/dL — ABNORMAL LOW (ref 13.0–17.0)
Lymphs Abs: 1.5 10*3/uL (ref 0.7–4.0)
Monocytes Relative: 12.6 % — ABNORMAL HIGH (ref 3.0–12.0)
Platelets: 220 10*3/uL (ref 150.0–400.0)
WBC: 5.4 10*3/uL (ref 4.5–10.5)

## 2013-06-09 LAB — POCT URINALYSIS DIPSTICK
Blood, UA: NEGATIVE
Protein, UA: NEGATIVE
Spec Grav, UA: 1.015
Urobilinogen, UA: 0.2

## 2013-06-09 LAB — HEPATIC FUNCTION PANEL
ALT: 25 U/L (ref 0–53)
AST: 31 U/L (ref 0–37)
Albumin: 4.1 g/dL (ref 3.5–5.2)
Alkaline Phosphatase: 62 U/L (ref 39–117)
Total Protein: 6.9 g/dL (ref 6.0–8.3)

## 2013-06-09 LAB — BASIC METABOLIC PANEL
BUN: 18 mg/dL (ref 6–23)
GFR: 75.01 mL/min (ref >60.00)
Potassium: 4.2 mEq/L (ref 3.5–5.1)
Sodium: 135 mEq/L (ref 135–145)

## 2013-06-16 ENCOUNTER — Ambulatory Visit (INDEPENDENT_AMBULATORY_CARE_PROVIDER_SITE_OTHER): Payer: Medicare Other | Admitting: Family Medicine

## 2013-06-16 ENCOUNTER — Encounter: Payer: Self-pay | Admitting: Family Medicine

## 2013-06-16 VITALS — BP 130/72 | HR 60 | Temp 97.6°F | Wt 228.0 lb

## 2013-06-16 DIAGNOSIS — D649 Anemia, unspecified: Secondary | ICD-10-CM

## 2013-06-16 DIAGNOSIS — Z23 Encounter for immunization: Secondary | ICD-10-CM

## 2013-06-16 DIAGNOSIS — R7309 Other abnormal glucose: Secondary | ICD-10-CM

## 2013-06-16 DIAGNOSIS — R7303 Prediabetes: Secondary | ICD-10-CM

## 2013-06-16 DIAGNOSIS — E669 Obesity, unspecified: Secondary | ICD-10-CM | POA: Insufficient documentation

## 2013-06-16 DIAGNOSIS — Z0000 Encounter for general adult medical examination without abnormal findings: Secondary | ICD-10-CM

## 2013-06-16 NOTE — Progress Notes (Signed)
Pre visit review using our clinic review tool, if applicable. No additional management support is needed unless otherwise documented below in the visit note. 

## 2013-06-16 NOTE — Progress Notes (Signed)
Subjective:    Patient ID: Kyle [name], male    DOB: 28-Nov-1945, 67 y.o.   MRN: [id number]  HPI Patient seen for complete physical He has history of CAD, hypertension, and hyperlipidemia. Also apparently has history prediabetes. Medications reviewed. Compliant with all. He has history of reported obstructive sleep apnea but did not tolerate CPAP. Generally sleeps about 5 hours per night.  He had colonoscopy age 62 but none since then. No recent stool changes. Last tetanus reported less than 10 years ago and patient not able to give specific date today. He's had previous shingles clinically but not vaccine and is requesting vaccine today.  He has no contraindications.  Past Medical History  Diagnosis Date  . GERD (gastroesophageal reflux disease)   . Cardiac arrhythmia   . Hyperlipidemia   . Hypertension   . CAD (coronary artery disease) 2011   Past Surgical History  Procedure Laterality Date  . Appendectomy    . Eye surgery      lasix    reports that he quit smoking about 29 years ago. His smoking use included Cigarettes. He has a 40 pack-year smoking history. He does not have any smokeless tobacco history on file. His alcohol and drug histories are not on file. family history includes Stroke in his father and mother. No Known Allergies    Review of Systems  Constitutional: Negative for fever, activity change, appetite change and fatigue.  HENT: Negative for congestion, ear pain and trouble swallowing.   Eyes: Negative for pain and visual disturbance.  Respiratory: Negative for cough, shortness of breath and wheezing.   Cardiovascular: Negative for chest pain and palpitations.  Gastrointestinal: Negative for nausea, vomiting, abdominal pain, diarrhea, constipation, blood in stool, abdominal distention and rectal pain.  Genitourinary: Negative for dysuria, hematuria and testicular pain.  Musculoskeletal: Negative for arthralgias and joint swelling.  Skin: Negative for  rash.  Neurological: Negative for dizziness, syncope and headaches.  Hematological: Negative for adenopathy.  Psychiatric/Behavioral: Negative for confusion and dysphoric mood.       Objective:   Physical Exam  Constitutional: He is oriented to person, place, and time. He appears well-developed and well-nourished. No distress.  HENT:  Head: Normocephalic and atraumatic.  Right Ear: External ear normal.  Left Ear: External ear normal.  Mouth/Throat: Oropharynx is clear and moist.  Eyes: Conjunctivae and EOM are normal. Pupils are equal, round, and reactive to light.  Neck: Normal range of motion. Neck supple. No thyromegaly present.  Cardiovascular: Normal rate, regular rhythm and normal heart sounds.   No murmur heard. Pulmonary/Chest: No respiratory distress. He has no wheezes. He has no rales.  Abdominal: Soft. Bowel sounds are normal. He exhibits no distension and no mass. There is no tenderness. There is no rebound and no guarding.  Genitourinary: Rectum normal and prostate normal.  Musculoskeletal: He exhibits no edema.  Lymphadenopathy:    He has no cervical adenopathy.  Neurological: He is alert and oriented to person, place, and time. He displays normal reflexes. No cranial nerve deficit.  Skin: No rash noted.  Psychiatric: He has a normal mood and affect.          Assessment & Plan:  Complete physical. Schedule repeat colonoscopy. Shingles vaccine given. Confirm date of last tetanus. Flu vaccine already given.  Normocytic anemia with mild anemia -hgb 12.5 which is new. Check Hemoccult cards. Check serum iron, TIBC,ferritin and B12 level. Schedule colonoscopy as above  Minimally elevated TSH. Probable subclinical hypothyroidism. Patient asymptomatic. Recheck TSH  6 months

## 2013-06-16 NOTE — Patient Instructions (Signed)
We will call you regarding repeat colonoscopy. 

## 2013-06-26 ENCOUNTER — Other Ambulatory Visit: Payer: Self-pay | Admitting: *Deleted

## 2013-06-26 DIAGNOSIS — E785 Hyperlipidemia, unspecified: Secondary | ICD-10-CM

## 2013-06-26 DIAGNOSIS — Z79899 Other long term (current) drug therapy: Secondary | ICD-10-CM

## 2013-07-17 ENCOUNTER — Other Ambulatory Visit: Payer: Medicare Other

## 2013-07-19 ENCOUNTER — Encounter: Payer: Self-pay | Admitting: Internal Medicine

## 2013-08-18 ENCOUNTER — Other Ambulatory Visit: Payer: Self-pay | Admitting: Cardiology

## 2013-08-22 ENCOUNTER — Other Ambulatory Visit: Payer: Self-pay | Admitting: Cardiology

## 2013-09-06 ENCOUNTER — Ambulatory Visit (AMBULATORY_SURGERY_CENTER): Payer: Self-pay

## 2013-09-06 VITALS — Ht 71.5 in | Wt 225.6 lb

## 2013-09-06 DIAGNOSIS — Z1211 Encounter for screening for malignant neoplasm of colon: Secondary | ICD-10-CM

## 2013-09-06 MED ORDER — MOVIPREP 100 G PO SOLR: ORAL | Status: DC

## 2013-09-14 ENCOUNTER — Encounter: Payer: Self-pay | Admitting: Internal Medicine

## 2013-09-20 ENCOUNTER — Ambulatory Visit (AMBULATORY_SURGERY_CENTER): Payer: Medicare HMO | Admitting: Internal Medicine

## 2013-09-20 ENCOUNTER — Encounter: Payer: Self-pay | Admitting: Internal Medicine

## 2013-09-20 VITALS — BP 149/79 | HR 66 | Temp 96.2°F | Resp 27 | Ht 71.0 in | Wt 225.0 lb

## 2013-09-20 DIAGNOSIS — D126 Benign neoplasm of colon, unspecified: Secondary | ICD-10-CM

## 2013-09-20 DIAGNOSIS — Z1211 Encounter for screening for malignant neoplasm of colon: Secondary | ICD-10-CM

## 2013-09-20 MED ORDER — SODIUM CHLORIDE 0.9 % IV SOLN
500.0000 mL | INTRAVENOUS | Status: DC
Start: 2013-09-20 — End: 2013-09-20

## 2013-09-20 NOTE — Progress Notes (Signed)
No complaints noted in the recovery room. Maw   

## 2013-09-20 NOTE — Patient Instructions (Signed)
YOU HAD AN ENDOSCOPIC PROCEDURE TODAY AT THE [facility]: Refer to the procedure report that was given to you for any specific questions about what was found during the examination.  If the procedure report does not answer your questions, please call your gastroenterologist to clarify.  If you requested that your care partner not be given the details of your procedure findings, then the procedure report has been included in a sealed envelope for you to review at your convenience later.  YOU SHOULD EXPECT: Some feelings of bloating in the abdomen. Passage of more gas than usual.  Walking can help get rid of the air that was put into your GI tract during the procedure and reduce the bloating. If you had a lower endoscopy (such as a colonoscopy or flexible sigmoidoscopy) you may notice spotting of blood in your stool or on the toilet paper. If you underwent a bowel prep for your procedure, then you may not have a normal bowel movement for a few days.  DIET: Your first meal following the procedure should be a light meal and then it is ok to progress to your normal diet.  A half-sandwich or bowl of soup is an example of a good first meal.  Heavy or fried foods are harder to digest and may make you feel nauseous or bloated.  Likewise meals heavy in dairy and vegetables can cause extra gas to form and this can also increase the bloating.  Drink plenty of fluids but you should avoid alcoholic beverages for 24 hours.  ACTIVITY: Your care partner should take you home directly after the procedure.  You should plan to take it easy, moving slowly for the rest of the day.  You can resume normal activity the day after the procedure however you should NOT DRIVE or use heavy machinery for 24 hours (because of the sedation medicines used during the test).    SYMPTOMS TO REPORT IMMEDIATELY: A gastroenterologist can be reached at any hour.  During normal business hours, 8:30 AM to 5:00 PM Monday through Friday,  call [phone].  After hours and on weekends, please call the GI answering service at [phone] who will take a message and have the physician on call contact you.   Following lower endoscopy (colonoscopy or flexible sigmoidoscopy):  Excessive amounts of blood in the stool  Significant tenderness or worsening of abdominal pains  Swelling of the abdomen that is new, acute  Fever of 100F or higher    FOLLOW UP: If any biopsies were taken you will be contacted by phone or by letter within the next 1-3 weeks.  Call your gastroenterologist if you have not heard about the biopsies in 3 weeks.  Our staff will call the home number listed on your records the next business day following your procedure to check on you and address any questions or concerns that you may have at that time regarding the information given to you following your procedure. This is a courtesy call and so if there is no answer at the home number and we have not heard from you through the emergency physician on call, we will assume that you have returned to your regular daily activities without incident.  SIGNATURES/CONFIDENTIALITY: You and/or your care partner have signed paperwork which will be entered into your electronic medical record.  These signatures attest to the fact that that the information above on your After Visit Summary has been reviewed and is understood.  Full responsibility of the confidentiality   of this discharge information lies with you and/or your care-partner.    You may resume your current medications today. Handouts were given to your care partner on polyps and a high fiber diet with liberal fluid intake. Await biopsies results. Please call if any questions or concerns.

## 2013-09-20 NOTE — Op Note (Addendum)
[facility]  [address] [place], [id number]   COLONOSCOPY PROCEDURE REPORT  PATIENT: Kyle [name], Kyle [name]  MR#: [id number] BIRTHDATE: 04-12-1946 , 67  yrs. old GENDER: Male ENDOSCOPIST: Lafayette Dragon, MD REFERRED SH:FWYOV Burchette, M.D. PROCEDURE DATE:  09/20/2013 PROCEDURE:   Colonoscopy with snare polypectomy First Screening Colonoscopy - Avg.  risk and is 50 yrs.  old or older Yes.  First Screening Colonoscopy - Avg.  risk and is 50 yrs. old or older - No.  Prior Negative Screening - Now for repeat screening.  N/A Prior Negative Screening - Now for repeat screening.  10 or more years since last screening  History of Adenoma - Now for follow-up colonoscopy & has been > or = to 3 yrs.  ASA CLASS:   Class II INDICATIONS:Average risk patient for colon cancer.  ,prior colonoscopy in 11 years ago was normal MEDICATIONS: MAC sedation, administered by CRNA and propofol (Diprivan) 350mg  IV  DESCRIPTION OF PROCEDURE:   After the risks benefits and alternatives of the procedure were thoroughly explained, informed consent was obtained.  A digital rectal exam revealed no abnormalities of the rectum.   The LB [id number]  endoscope was introduced through the anus and advanced to the cecum, which was identified by both the appendix and ileocecal valve. No adverse events experienced.   The quality of the prep was good, using MoviPrep  The instrument was then slowly withdrawn as the colon was fully examined.      COLON FINDINGS: A polypoid shaped sessile polyp ranging between 3-69mm in size was found in the sigmoid colon.  A polypectomy was performed with a cold snare.  The resection was complete and the polyp tissue was completely retrieved.  Retroflexed views revealed no abnormalities. The time to cecum=9 minutes 10 seconds. Withdrawal time=6 minutes 00 seconds.  The scope was withdrawn and the procedure completed. COMPLICATIONS: There were no  complications.  ENDOSCOPIC IMPRESSION: Sessile polyp ranging between 3-12mm in size was found in the sigmoid colon; polypectomy was performed with a cold snare  RECOMMENDATIONS: 1.  Await biopsy results 2.  high fiber diet Recall colonoscopy pending path report   eSigned:  Lafayette Dragon, MD 09/20/2013 11:23 AM Revised: 09/20/2013 11:23 AM  cc:   PATIENT NAME:  Kyle [name], Kyle [name] MR#: [id number]

## 2013-09-20 NOTE — Progress Notes (Signed)
Lidocaine-40mg IV prior to Propofol InductionPropofol given over incremental dosages 

## 2013-09-20 NOTE — Progress Notes (Signed)
Called to room to assist during endoscopic procedure.  Patient ID and intended procedure confirmed with present staff. Received instructions for my participation in the procedure from the performing physician.  

## 2013-09-21 ENCOUNTER — Telehealth: Payer: Self-pay | Admitting: *Deleted

## 2013-09-21 NOTE — Telephone Encounter (Signed)
  Follow up Call-  Call back number 09/20/2013  Post procedure Call Back phone  # [phone]  Permission to leave phone message Yes     Patient questions:  Do you have a fever, pain , or abdominal swelling? no Pain Score  0 *  Have you tolerated food without any problems? yes  Have you been able to return to your normal activities? yes  Do you have any questions about your discharge instructions: Diet   no Medications  no Follow up visit  no  Do you have questions or concerns about your Care? no  Actions: * If pain score is 4 or above: No action needed, pain <4.

## 2013-09-25 ENCOUNTER — Encounter: Payer: Self-pay | Admitting: Internal Medicine

## 2013-09-26 ENCOUNTER — Encounter: Payer: Self-pay | Admitting: *Deleted

## 2013-10-25 ENCOUNTER — Ambulatory Visit: Payer: Medicare HMO | Admitting: Family Medicine

## 2013-10-25 DIAGNOSIS — Z0289 Encounter for other administrative examinations: Secondary | ICD-10-CM

## 2013-10-31 ENCOUNTER — Telehealth: Payer: Self-pay | Admitting: Family Medicine

## 2013-10-31 NOTE — Telephone Encounter (Signed)
Patient Information:  Caller Name: Butch Penny  Phone: [phone]  Patient: Kyle [name]  Gender: Male  DOB: 20-Jan-1946  Age: 68 Years  PCP: Carolann Littler [facility])  Office Follow Up:  Does the office need to follow up with this patient?: No  Instructions For The Office: N/A  RN Note:  Will call the office to schedule Follow-up for Sleep Apnea.  Symptoms  Reason For Call & Symptoms: Has hx of Sleep Apnea. Snoring so bad, wife cannot sleep with him anymore. Threw his mask away. Sleeps a lot during the day. Gasps for air.Used to smoke. Sob on exertion.  Reviewed Health History In EMR: Yes  Reviewed Medications In EMR: Yes  Reviewed Allergies In EMR: Yes  Reviewed Surgeries / Procedures: Yes  Date of Onset of Symptoms: 10/31/2013  Guideline(s) Used:  Breathing Difficulty  Disposition Per Guideline:   See Within 2 Weeks in Office  Reason For Disposition Reached:   Mild longstanding difficulty breathing (e.g., speaks in phrases, SOB even at rest, pulse 100-120) and same as normal  Advice Given:  Call Back If:  You become worse.  Patient Will Follow Care Advice:  YES  Appointment Scheduled:  11/01/2013 10:45:00 Appointment Scheduled Provider:  Carolann Littler [facility])

## 2013-11-01 ENCOUNTER — Encounter: Payer: Self-pay | Admitting: Family Medicine

## 2013-11-01 ENCOUNTER — Ambulatory Visit (INDEPENDENT_AMBULATORY_CARE_PROVIDER_SITE_OTHER): Payer: Medicare HMO | Admitting: Family Medicine

## 2013-11-01 VITALS — BP 130/68 | HR 66 | Temp 97.8°F | Wt 231.0 lb

## 2013-11-01 DIAGNOSIS — R5383 Other fatigue: Secondary | ICD-10-CM

## 2013-11-01 DIAGNOSIS — R5381 Other malaise: Secondary | ICD-10-CM

## 2013-11-01 DIAGNOSIS — R400 Somnolence: Secondary | ICD-10-CM

## 2013-11-01 DIAGNOSIS — R0609 Other forms of dyspnea: Secondary | ICD-10-CM

## 2013-11-01 DIAGNOSIS — G4733 Obstructive sleep apnea (adult) (pediatric): Secondary | ICD-10-CM | POA: Insufficient documentation

## 2013-11-01 DIAGNOSIS — R0989 Other specified symptoms and signs involving the circulatory and respiratory systems: Secondary | ICD-10-CM

## 2013-11-01 DIAGNOSIS — G4710 Hypersomnia, unspecified: Secondary | ICD-10-CM

## 2013-11-01 DIAGNOSIS — R0683 Snoring: Secondary | ICD-10-CM

## 2013-11-01 NOTE — Progress Notes (Signed)
Pre visit review using our clinic review tool, if applicable. No additional management support is needed unless otherwise documented below in the visit note. 

## 2013-11-01 NOTE — Progress Notes (Signed)
   Subjective:    Patient ID: Kyle [name], male    DOB: 02/18/1946, 68 y.o.   MRN: [id number]  HPI Patient seen with concern for possible obstructive sleep apnea. He just got married couple years ago. His wife complains he snores frequently and she has observed that he quits breathing at times. Patient relates sleep study about 10 years ago and was apparently diagnosed obstructive sleep apnea and was prescribed CPAP but never used. He does have occasional dyspnea with exertion. No chest pains. Has cardiac history and is followed closely by cardiology.  He has had some recent progressive fatigue and daytime somnolence. Nonsmoker. He has frequent nasal congestive symptoms. Currently not using any medications for allergy.  Past Medical History  Diagnosis Date  . GERD (gastroesophageal reflux disease)   . Cardiac arrhythmia   . Hyperlipidemia   . Hypertension   . CAD (coronary artery disease) 2011  . Myocardial infarction 2011  . COPD (chronic obstructive pulmonary disease)    Past Surgical History  Procedure Laterality Date  . Appendectomy    . Eye surgery      lasix /BIL  . Coronary angioplasty with stent placement      reports that he quit smoking about 29 years ago. His smoking use included Cigarettes. He has a 40 pack-year smoking history. He has never used smokeless tobacco. He reports that he drinks about 2.5 ounces of alcohol per week. He reports that he does not use illicit drugs. family history includes Multiple sclerosis in his sister; Stroke in his father and mother. No Known Allergies    Review of Systems  Constitutional: Negative for fatigue.  Eyes: Negative for visual disturbance.  Respiratory: Positive for shortness of breath. Negative for cough and chest tightness.   Cardiovascular: Negative for chest pain, palpitations and leg swelling.  Neurological: Negative for dizziness, syncope, weakness, light-headedness and headaches.       Objective:   Physical Exam    Constitutional: He appears well-developed and well-nourished.  HENT:  Mouth/Throat: Oropharynx is clear and moist.  Neck: Neck supple. No thyromegaly present.  Cardiovascular: Normal rate and regular rhythm.  Exam reveals no gallop.   Pulmonary/Chest: Effort normal and breath sounds normal. No respiratory distress. He has no wheezes. He has no rales.  Musculoskeletal: He exhibits no edema.          Assessment & Plan:  Patient presents with history of snoring, increasing fatigue, daytime somnolence and reported history obstructive sleep apnea with most recent study over 10 years ago.  Epworth sleepiness scale score 14. Set up referral to sleep specialist. Encourage weight loss.

## 2013-11-01 NOTE — Patient Instructions (Signed)
Sleep Apnea   Sleep apnea is a sleep disorder characterized by abnormal pauses in breathing while you sleep. When your breathing pauses, the level of oxygen in your blood decreases. This causes you to move out of deep sleep and into light sleep. As a result, your quality of sleep is poor, and the system that carries your blood throughout your body (cardiovascular system) experiences stress. If sleep apnea remains untreated, the following conditions can develop:  · High blood pressure (hypertension).  · Coronary artery disease.  · Inability to achieve or maintain an erection (impotence).  · Impairment of your thought process (cognitive dysfunction).  There are three types of sleep apnea:  1. Obstructive sleep apnea Pauses in breathing during sleep because of a blocked airway.  2. Central sleep apnea Pauses in breathing during sleep because the area of the brain that controls your breathing does not send the correct signals to the muscles that control breathing.  3. Mixed sleep apnea A combination of both obstructive and central sleep apnea.  RISK FACTORS  The following risk factors can increase your risk of developing sleep apnea:  · Being overweight.  · Smoking.  · Having narrow passages in your nose and throat.  · Being of older age.  · Being male.  · Alcohol use.  · Sedative and tranquilizer use.  · Ethnicity. Among individuals younger than 35 years, African Americans are at increased risk of sleep apnea.  SYMPTOMS   · Difficulty staying asleep.  · Daytime sleepiness and fatigue.  · Loss of energy.  · Irritability.  · Loud, heavy snoring.  · Morning headaches.  · Trouble concentrating.  · Forgetfulness.  · Decreased interest in sex.  DIAGNOSIS   In order to diagnose sleep apnea, your caregiver will perform a physical examination. Your caregiver may suggest that you take a home sleep test. Your caregiver may also recommend that you spend the night in a sleep lab. In the sleep lab, several monitors record  information about your heart, lungs, and brain while you sleep. Your leg and arm movements and blood oxygen level are also recorded.  TREATMENT  The following actions may help to resolve mild sleep apnea:  · Sleeping on your side.    · Using a decongestant if you have nasal congestion.    · Avoiding the use of depressants, including alcohol, sedatives, and narcotics.    · Losing weight and modifying your diet if you are overweight.  There also are devices and treatments to help open your airway:  · Oral appliances. These are custom-made mouthpieces that shift your lower jaw forward and slightly open your bite. This opens your airway.  · Devices that create positive airway pressure. This positive pressure "splints" your airway open to help you breathe better during sleep. The following devices create positive airway pressure:  · Continuous positive airway pressure (CPAP) device. The CPAP device creates a continuous level of air pressure with an air pump. The air is delivered to your airway through a mask while you sleep. This continuous pressure keeps your airway open.  · Nasal expiratory positive airway pressure (EPAP) device. The EPAP device creates positive air pressure as you exhale. The device consists of single-use valves, which are inserted into each nostril and held in place by adhesive. The valves create very little resistance when you inhale but create much more resistance when you exhale. That increased resistance creates the positive airway pressure. This positive pressure while you exhale keeps your airway open, making it easier   continuous air pressure through a mask. However, with the BPAP machine, the pressure is set at two different levels. The pressure when you  exhale is lower than the pressure when you inhale.  Surgery. Typically, surgery is only done if you cannot comply with less invasive treatments or if the less invasive treatments do not improve your condition. Surgery involves removing excess tissue in your airway to create a wider passage way. Document Released: 07/10/2002 Document Revised: 11/14/2012 Document Reviewed: 11/26/2011 [facility] Patient Information 2014 [place].  Consider over the counter Flonase for allergies and nasal congestion We will call you regarding sleep specialist referral.

## 2013-11-23 ENCOUNTER — Encounter: Payer: Self-pay | Admitting: Pulmonary Disease

## 2013-11-23 ENCOUNTER — Ambulatory Visit (INDEPENDENT_AMBULATORY_CARE_PROVIDER_SITE_OTHER): Payer: Medicare HMO | Admitting: Pulmonary Disease

## 2013-11-23 VITALS — BP 108/62 | HR 68 | Temp 97.5°F | Ht 71.0 in | Wt 233.0 lb

## 2013-11-23 DIAGNOSIS — G4733 Obstructive sleep apnea (adult) (pediatric): Secondary | ICD-10-CM

## 2013-11-23 NOTE — Patient Instructions (Signed)
Will schedule for a home sleep study, and arrange followup once the results are available.  Work on weight reduction.

## 2013-11-23 NOTE — Assessment & Plan Note (Signed)
The patient's history is very suggestive of clinically significant sleep apnea. I have had a long discussion with him and his wife about the pathophysiology of sleep disordered breathing, including its impact to quality of life and cardiovascular health. He will need a sleep study for diagnosis, and I think he is an excellent candidate for home sleep testing. The patient is agreeable to this approach.

## 2013-11-23 NOTE — Progress Notes (Signed)
Subjective:    Patient ID: Kyle [name], male    DOB: Aug 30, 1945, 68 y.o.   MRN: [id number]  HPI The patient is a 67 year old male who I've been asked to see for possible obstructive sleep apnea. He apparently underwent a sleep study over 12 years ago, and was told that he had sleep apnea. He was started on CPAP, but did not stay on the machine for various reasons. Currently, the patient has been noted to have loud snoring as well as an abnormal breathing pattern during sleep. He has frequent awakenings at night, and is not rested in the mornings upon arising. He dozes with any period of inactivity during the day or night, and also describes sleep pressure with driving. The patient's weight has increased 5 pounds over the last 2 years, and his Epworth score today is 22  Sleep Questionnaire What time do you typically go to bed?( Between what hours) 11-12 p.m.  11-12 p.m.  at 1155 on 11/23/13 by Len Blalock, CMA How long does it take you to fall asleep? 5 minutes 5 minutes at 1155 on 11/23/13 by Len Blalock, CMA How many times during the night do you wake up? 77 pt's wife states it is more than this  at 1155 on 11/23/13 by Len Blalock, CMA What time do you get out of bed to start your day? 0930 0930 at 1155 on 11/23/13 by Len Blalock, CMA Do you drive or operate heavy machinery in your occupation? No No at 1155 on 11/23/13 by Len Blalock, CMA How much has your weight changed (up or down) over the past two years? (In pounds) 5 lb (2.268 kg)5 lb (2.268 kg) fluxuates at 1155 on 11/23/13 by Len Blalock, CMA Have you ever had a sleep study before? Yes Yes at 1155 on 11/23/13 by Len Blalock, CMA If yes, location of study? [facility] at 1155 on 11/23/13 by Len Blalock, CMA If yes, date of study? about 12 years ago about 12 years ago at 83 on 11/23/13 by Len Blalock, CMA Do you currently use CPAP? No No at 1155 on  11/23/13 by Len Blalock, CMA Do you wear oxygen at any time? No No at 1155 on 11/23/13 by Len Blalock, CMA   Review of Systems  Constitutional: Negative for fever and unexpected weight change.  HENT: Positive for sinus pressure. Negative for congestion, dental problem, ear pain, nosebleeds, postnasal drip, rhinorrhea, sneezing, sore throat and trouble swallowing.   Eyes: Negative for redness and itching.  Respiratory: Positive for shortness of breath. Negative for cough, chest tightness and wheezing.   Cardiovascular: Negative for palpitations and leg swelling.  Gastrointestinal: Negative for nausea and vomiting.  Genitourinary: Negative for dysuria.  Musculoskeletal: Negative for joint swelling.  Skin: Negative for rash.  Neurological: Negative for headaches.  Hematological: Does not bruise/bleed easily.  Psychiatric/Behavioral: Negative for dysphoric mood. The patient is not nervous/anxious.        Objective:   Physical Exam Constitutional:  Overweight male , no acute distress  HENT:  Nares patent without discharge, but deviated septum to right with narrowing.  Oropharynx without exudate, palate and uvula are mildly elongated.   Eyes:  Perrla, eomi, no scleral icterus  Neck:  No JVD, no TMG  Cardiovascular:  Normal rate, regular rhythm, no rubs or gallops.  No murmurs        Intact distal pulses  Pulmonary :  Normal breath sounds, no  stridor or respiratory distress   No rales, rhonchi, or wheezing  Abdominal:  Soft, nondistended, bowel sounds present.  No tenderness noted.   Musculoskeletal:  No lower extremity edema noted.  Lymph Nodes:  No cervical lymphadenopathy noted  Skin:  No cyanosis noted  Neurologic:  Alert, appropriate, moves all 4 extremities without obvious deficit.         Assessment & Plan:

## 2013-12-14 ENCOUNTER — Ambulatory Visit (INDEPENDENT_AMBULATORY_CARE_PROVIDER_SITE_OTHER): Payer: Medicare HMO | Admitting: Family Medicine

## 2013-12-14 ENCOUNTER — Encounter: Payer: Self-pay | Admitting: Family Medicine

## 2013-12-14 VITALS — BP 130/78 | HR 70 | Temp 97.6°F | Wt 234.0 lb

## 2013-12-14 DIAGNOSIS — R7303 Prediabetes: Secondary | ICD-10-CM

## 2013-12-14 DIAGNOSIS — D649 Anemia, unspecified: Secondary | ICD-10-CM | POA: Insufficient documentation

## 2013-12-14 DIAGNOSIS — R7989 Other specified abnormal findings of blood chemistry: Secondary | ICD-10-CM

## 2013-12-14 DIAGNOSIS — R7309 Other abnormal glucose: Secondary | ICD-10-CM

## 2013-12-14 DIAGNOSIS — R946 Abnormal results of thyroid function studies: Secondary | ICD-10-CM

## 2013-12-14 NOTE — Progress Notes (Signed)
Pre visit review using our clinic review tool, if applicable. No additional management support is needed unless otherwise documented below in the visit note. 

## 2013-12-14 NOTE — Progress Notes (Signed)
   Subjective:    Patient ID: Kyle [name], male    DOB: 01-23-1946, 68 y.o.   MRN: [id number]  HPI Comments: Patient is a 68 year old male with a history of CAD, hypertension, sleep apnea, prediabetes, hyperlipidemia, and obesity who presents for his six-month follow-up appointment. He has no complaints at this time. Patient exercises by playing golf. His last labs were drawn in November. Patient admits he experiences some mild shortness of breath while walking down stairs but never at a level ground. He denies fever, chills, dizziness, congestion, chest pain, abdominal pain, melena, hematochezia, nausea, vomiting, diarrhea, constipation.  He is waiting to have a another sleep study done. He is interested in receiving the Prevnar 13 vaccine.   Review of Systems  Constitutional: Negative for fever and chills.  Eyes: Negative for visual disturbance.  Respiratory: Positive for shortness of breath. Negative for cough.   Cardiovascular: Negative for chest pain and palpitations.  Gastrointestinal: Negative for nausea, vomiting, abdominal pain, diarrhea, constipation and blood in stool.  Endocrine: Negative for polydipsia, polyphagia and polyuria.  Musculoskeletal: Negative for arthralgias and myalgias.  Neurological: Negative for dizziness.      Objective:   Physical Exam  Constitutional: He appears well-developed and well-nourished.  HENT:  Head: Normocephalic.  Eyes: Conjunctivae are normal. Pupils are equal, round, and reactive to light.  Cardiovascular: Normal rate, normal heart sounds and intact distal pulses.   Pulmonary/Chest: Effort normal and breath sounds normal.  Lymphadenopathy:    He has no cervical adenopathy.  Neurological: He is alert.  Skin: Skin is warm and dry.      Assessment & Plan:  1. Health Maintenance Patient not fasting today. Future order to follow up CBC with diff, iron and TIBC, and ferritin to monitor because previously low. Another B12 to follow because  borderline low, and another TSH because borderline high. Come back fasting for a BMP to monitor glucose given his prediabetes status. Give Prevnar 13 today.   Kyle Lafreda Casebeer, PA-S  As above,  Pt had several borderline or abnormal labs recently (B12, TSH, low ferritin and hgb).  Recent colonoscopy unrevealing.  Labs as above to reassess.  Will also repeat fasting glucose at follow up.  Carolann Littler MD

## 2013-12-14 NOTE — Patient Instructions (Signed)

## 2013-12-30 ENCOUNTER — Encounter: Payer: Self-pay | Admitting: Pulmonary Disease

## 2014-01-01 NOTE — Telephone Encounter (Signed)
Dr Gwenette Greet, please advise on results of sleep study as patient is emailing requesting these results.  Pt aware that you are out of office until 01/02/14-- will await call/email.  Thanks.

## 2014-01-02 NOTE — Telephone Encounter (Signed)
Let pt know that I will be reading his study soon since I am just back in town.  I was aware of the issue from [facility].  More than likely study is fine, since there are 2 belts with one serving as backup and additional info.

## 2014-01-05 DIAGNOSIS — G4710 Hypersomnia, unspecified: Secondary | ICD-10-CM

## 2014-01-05 DIAGNOSIS — G4730 Sleep apnea, unspecified: Secondary | ICD-10-CM

## 2014-01-10 ENCOUNTER — Telehealth: Payer: Self-pay | Admitting: Pulmonary Disease

## 2014-01-10 ENCOUNTER — Ambulatory Visit (INDEPENDENT_AMBULATORY_CARE_PROVIDER_SITE_OTHER): Payer: Medicare HMO | Admitting: Pulmonary Disease

## 2014-01-10 ENCOUNTER — Encounter: Payer: Self-pay | Admitting: Pulmonary Disease

## 2014-01-10 VITALS — BP 118/70 | HR 82 | Temp 97.8°F | Ht 71.0 in | Wt 229.0 lb

## 2014-01-10 DIAGNOSIS — G4733 Obstructive sleep apnea (adult) (pediatric): Secondary | ICD-10-CM

## 2014-01-10 DIAGNOSIS — G4730 Sleep apnea, unspecified: Secondary | ICD-10-CM

## 2014-01-10 DIAGNOSIS — G4710 Hypersomnia, unspecified: Secondary | ICD-10-CM

## 2014-01-10 NOTE — Patient Instructions (Signed)
Will start on cpap at a moderate pressure level.  Please call if you are having issues with tolerance. Work on weight loss followup with me again in 8 weeks.  

## 2014-01-10 NOTE — Telephone Encounter (Signed)
Pt needs ov to review sleep study 

## 2014-01-10 NOTE — Assessment & Plan Note (Signed)
The patient has moderate obstructive sleep apnea by his recent home sleep test, and continues to have significant symptoms at night and during the day. I have outlined a trial of weight loss alone versus more aggressive therapy with a dental appliance or CPAP. After a long discussion, we have decided to try CPAP. Will start him out at a moderate pressure to allow for desensitization, and he is to followup with me in 8 weeks.

## 2014-01-10 NOTE — Progress Notes (Signed)
   Subjective:    Patient ID: Kyle [name], male    DOB: 06/12/1946, 68 y.o.   MRN: [id number]  HPI Patient comes in today for followup after his recent home sleep test. He was found to have moderate OSA, with an AHI of 19 events per hour. I have reviewed the study with him in detail, and answered all of his questions.   Review of Systems  Constitutional: Negative for fever and unexpected weight change.  HENT: Negative for congestion, dental problem, ear pain, nosebleeds, postnasal drip, rhinorrhea, sinus pressure, sneezing, sore throat and trouble swallowing.   Eyes: Negative for redness and itching.  Respiratory: Negative for cough, chest tightness, shortness of breath and wheezing.   Cardiovascular: Negative for palpitations and leg swelling.  Gastrointestinal: Negative for nausea and vomiting.  Genitourinary: Negative for dysuria.  Musculoskeletal: Negative for joint swelling.  Skin: Negative for rash.  Neurological: Negative for headaches.  Hematological: Does not bruise/bleed easily.  Psychiatric/Behavioral: Negative for dysphoric mood. The patient is not nervous/anxious.        Objective:   Physical Exam Overweight male in no acute distress Nose without purulence or discharge noted Neck without lymphadenopathy or thyromegaly Lower extremities without edema, no cyanosis Awake, but appears sleepy, moves all 4 extremities.       Assessment & Plan:

## 2014-01-10 NOTE — Telephone Encounter (Signed)
Apt set for today at 2:30.Anahola Bing, CMA

## 2014-01-11 ENCOUNTER — Encounter: Payer: Self-pay | Admitting: Pulmonary Disease

## 2014-01-23 ENCOUNTER — Other Ambulatory Visit: Payer: Self-pay | Admitting: Family Medicine

## 2014-02-25 ENCOUNTER — Other Ambulatory Visit: Payer: Self-pay | Admitting: Cardiology

## 2014-03-06 ENCOUNTER — Other Ambulatory Visit: Payer: Self-pay | Admitting: Cardiology

## 2014-03-07 ENCOUNTER — Ambulatory Visit (INDEPENDENT_AMBULATORY_CARE_PROVIDER_SITE_OTHER): Payer: Medicare HMO | Admitting: Pulmonary Disease

## 2014-03-07 ENCOUNTER — Encounter: Payer: Self-pay | Admitting: Pulmonary Disease

## 2014-03-07 ENCOUNTER — Ambulatory Visit (INDEPENDENT_AMBULATORY_CARE_PROVIDER_SITE_OTHER): Payer: Medicare HMO | Admitting: Cardiology

## 2014-03-07 ENCOUNTER — Encounter: Payer: Self-pay | Admitting: Cardiology

## 2014-03-07 VITALS — BP 118/78 | HR 64 | Temp 97.6°F | Ht 71.0 in | Wt 221.6 lb

## 2014-03-07 VITALS — BP 133/77 | HR 59 | Ht 71.0 in | Wt 222.0 lb

## 2014-03-07 DIAGNOSIS — Z79899 Other long term (current) drug therapy: Secondary | ICD-10-CM

## 2014-03-07 DIAGNOSIS — I10 Essential (primary) hypertension: Secondary | ICD-10-CM

## 2014-03-07 DIAGNOSIS — G4733 Obstructive sleep apnea (adult) (pediatric): Secondary | ICD-10-CM

## 2014-03-07 DIAGNOSIS — R0602 Shortness of breath: Secondary | ICD-10-CM

## 2014-03-07 DIAGNOSIS — E785 Hyperlipidemia, unspecified: Secondary | ICD-10-CM

## 2014-03-07 DIAGNOSIS — I2510 Atherosclerotic heart disease of native coronary artery without angina pectoris: Secondary | ICD-10-CM

## 2014-03-07 DIAGNOSIS — E669 Obesity, unspecified: Secondary | ICD-10-CM

## 2014-03-07 LAB — BASIC METABOLIC PANEL
BUN: 23 mg/dL (ref 6–23)
CALCIUM: 9.6 mg/dL (ref 8.4–10.5)
CHLORIDE: 98 meq/L (ref 96–112)
CO2: 28 mEq/L (ref 19–32)
Creatinine, Ser: 1.1 mg/dL (ref 0.4–1.5)
GFR: 74.02 mL/min (ref >60.00)
Glucose, Bld: 105 mg/dL — ABNORMAL HIGH (ref 70–99)
Potassium: 4.4 mEq/L (ref 3.5–5.1)
Sodium: 137 mEq/L (ref 135–145)

## 2014-03-07 LAB — CBC
HCT: 36.8 % — ABNORMAL LOW (ref 39.0–52.0)
Hemoglobin: 12.1 g/dL — ABNORMAL LOW (ref 13.0–17.0)
MCHC: 32.9 g/dL (ref 30.0–36.0)
MCV: 89.1 fl (ref 78.0–100.0)
Platelets: 192 10*3/uL (ref 150.0–400.0)
RBC: 4.14 Mil/uL — ABNORMAL LOW (ref 4.22–5.81)
RDW: 13.5 % (ref 11.5–15.5)
WBC: 4.3 10*3/uL (ref 4.0–10.5)

## 2014-03-07 NOTE — Progress Notes (Signed)
   Subjective:    Patient ID: Kyle [name], male    DOB: [birth year], 68 y.o.   MRN: [id number]  HPI The patient comes in today for followup of his obstructive sleep apnea. He was started on CPAP at the last visit with an automatic setting, and has been wearing the device compliantly by his download. He is having no issues with pressure, but is having issues with significant mask leak. His download verifies this, and also shows that he has breakthrough events because of the mask leak. The patient is continued trying the device, and has ordered a cloth liner to try with his mask.   Review of Systems  Constitutional: Negative for fever and unexpected weight change.  HENT: Negative for congestion, dental problem, ear pain, nosebleeds, postnasal drip, rhinorrhea, sinus pressure, sneezing, sore throat and trouble swallowing.   Eyes: Negative for redness and itching.  Respiratory: Negative for cough, chest tightness, shortness of breath and wheezing.   Cardiovascular: Negative for palpitations and leg swelling.  Gastrointestinal: Negative for nausea and vomiting.  Genitourinary: Negative for dysuria.  Musculoskeletal: Negative for joint swelling.  Skin: Negative for rash.  Neurological: Negative for headaches.  Hematological: Does not bruise/bleed easily.  Psychiatric/Behavioral: Negative for dysphoric mood. The patient is not nervous/anxious.        Objective:   Physical Exam Overweight male in no acute distress Nose without purulence or discharge noted No skin breakdown or pressure necrosis from the CPAP mask Neck without lymphadenopathy or thyromegaly Lower extremities with no significant edema, no cyanosis Alert and oriented, does not appear to be sleepy, moves all 4 extremities.       Assessment & Plan:

## 2014-03-07 NOTE — Progress Notes (Signed)
[facility] [address], [address], [place]  [address] Phone: [phone] Fax:  [phone]  Date:  03/07/2014   ID:  Antiono, Ettinger 1945-09-09, MRN [id number]  PCP:  Eulas Post, MD   History of Present Illness: Kyle [name] is a 68 y.o. male with coronary artery disease status post non-ST elevation myocardial infarction with hyperlipidemia, hypertension, obesity here for four-month followup. Overall, doing well, no specific exertional angina, but he is having some shortness of breath unchanged. Going up stair. He underwent echocardiogram on 03/31/12 that showed an ejection fraction of 50% with base to mid inferior hypokinesis. He also underwent a nuclear stress test at that time which showed overall low risk with no evidence of significant ischemia. His prior cardiac catheterization was in June of 2011. Stent was placed to the right coronary artery at that time. He is still battling with weight.  He still having symptoms as described above. Shortness of breath. I wonder if this more of a COPD component possibly.     Wt Readings from Last 3 Encounters:  03/07/14 222 lb (100.699 kg)  01/10/14 229 lb (103.874 kg)  12/14/13 234 lb (106.142 kg)     Past Medical History  Diagnosis Date  . GERD (gastroesophageal reflux disease)   . Cardiac arrhythmia   . Hyperlipidemia   . Hypertension   . CAD (coronary artery disease) 2011  . Myocardial infarction 2011  . COPD (chronic obstructive pulmonary disease)     Past Surgical History  Procedure Laterality Date  . Appendectomy    . Eye surgery      lasix /BIL  . Coronary angioplasty with stent placement      Current Outpatient Prescriptions  Medication Sig Dispense Refill  . aspirin 81 MG tablet Take 81 mg by mouth daily.      Marland Kitchen atorvastatin (LIPITOR) 40 MG tablet TAKE 1 TABLET ONCE A DAY ORALLY  90 tablet  0  . benazepril-hydrochlorthiazide (LOTENSIN HCT) 20-25 MG per tablet TAKE TAKE ONE TABLET BY MOUTH EVERY  DAY  90 tablet  0  . cetirizine (ZYRTEC) 10 MG tablet Take 10 mg by mouth daily.      . metoprolol tartrate (LOPRESSOR) 25 MG tablet TAKE 1 TABLET TWICE A DAY  180 tablet  0  . montelukast (SINGULAIR) 10 MG tablet TAKE 1 TABLET (10 MG TOTAL) BY MOUTH AT BEDTIME.  30 tablet  5  . Multiple Vitamin (MULTIVITAMIN) tablet Take 1 tablet by mouth daily.      Marland Kitchen omeprazole (PRILOSEC OTC) 20 MG tablet Take 20 mg by mouth daily.       No current facility-administered medications for this visit.    Allergies:   No Known Allergies  Social History:  The patient  reports that he quit smoking about 20 years ago. His smoking use included Cigarettes. He has a 112 pack-year smoking history. He has never used smokeless tobacco. He reports that he drinks about 2.5 ounces of alcohol per week. He reports that he does not use illicit drugs.   Family History  Problem Relation Age of Onset  . Stroke Mother   . Stroke Father   . Multiple sclerosis Sister     ROS:  Please see the history of present illness.   Denies any fevers, chills, orthopnea, PND, syncope   All other systems reviewed and negative.   PHYSICAL EXAM: VS:  BP 133/77  Pulse 59  Ht 5\' 11"  (1.803 m)  Wt  222 lb (100.699 kg)  BMI 30.98 kg/m2 Well nourished, well developed, in no acute distress HEENT: normal, [place]/AT, EOMI Neck: no JVD, normal carotid upstroke, no bruit Cardiac:  normal S1, S2; RRR; no murmur Lungs:  clear to auscultation bilaterally, no wheezing, rhonchi or rales Abd: soft, nontender, no hepatomegaly, no bruits Ext: no edema, 2+ distal pulses Skin: warm and dry GU: deferred Neuro: no focal abnormalities noted, AAO x 3  EKG:  03/07/14-sinus bradycardia rate 59 with PVCs, 2 seen on EKG    LABS: LDL 59 on 06/09/13  ASSESSMENT AND PLAN:  1. Coronary artery disease without angina-currently doing well. Secondary prevention. 2. Hypertension-well-controlled. 3. Hyperlipidemia-atorvastatin 40. LDL goal less than  70. 4. PVC's-Asymptomatic. 5. OSA - Dr. Gwenette Greet.  6. COPD-he may not have had pulmonary function studies done recently. He does not recall having that done. He will be seeing Dr. Gwenette Greet later today. I wonder if any additional medication will help him with his shortness of breath with exertion. I'm also going to check a pro BNP I will check a CBC and basic metabolic profile as well. If highly elevated, we may need to be more robust with diuresis. He currently is on a mild diuretic. Blood pressure is under good control. He does note some occasional orthostatic-like symptoms when getting up from a bent over position. 7. One year followup  Signed, Candee Furbish, MD [facility]  03/07/2014 9:21 AM

## 2014-03-07 NOTE — Patient Instructions (Addendum)
The current medical regimen is effective;  continue present plan and medications.  Please have blood work today  (BMP, CBC and Pro-BNP)  Follow up in 1 year with Dr Marlou Porch.  You will receive a letter in the mail 2 months before you are due.  Please call us when you receive this letter to schedule your follow up appointment.

## 2014-03-07 NOTE — Assessment & Plan Note (Signed)
The patient is wearing CPAP compliantly by his download, but is having a very significant mask leak with breakthrough events. He has ordered a cloth liner for his mask, and hopefully this will take care of the issue. If it does not, he will need to look at a different type of mask. I have also encouraged him to work aggressively on weight loss.

## 2014-03-07 NOTE — Patient Instructions (Signed)
Try using the cloth liner with your cpap mask, and if still having leaks, will need to try a different type of mask.  Let me know if not improving. Work on weight loss followup with me again in 9mos.

## 2014-03-12 ENCOUNTER — Ambulatory Visit (INDEPENDENT_AMBULATORY_CARE_PROVIDER_SITE_OTHER): Payer: Medicare HMO | Admitting: *Deleted

## 2014-03-12 DIAGNOSIS — R0602 Shortness of breath: Secondary | ICD-10-CM

## 2014-03-12 LAB — BRAIN NATRIURETIC PEPTIDE: Pro B Natriuretic peptide (BNP): 20 pg/mL (ref 0.0–100.0)

## 2014-05-18 ENCOUNTER — Ambulatory Visit: Payer: Medicare HMO

## 2014-06-02 ENCOUNTER — Other Ambulatory Visit: Payer: Self-pay | Admitting: Cardiology

## 2014-06-19 ENCOUNTER — Other Ambulatory Visit (INDEPENDENT_AMBULATORY_CARE_PROVIDER_SITE_OTHER): Payer: Medicare HMO

## 2014-06-19 DIAGNOSIS — I10 Essential (primary) hypertension: Secondary | ICD-10-CM

## 2014-06-19 DIAGNOSIS — Z0000 Encounter for general adult medical examination without abnormal findings: Secondary | ICD-10-CM

## 2014-06-19 DIAGNOSIS — E785 Hyperlipidemia, unspecified: Secondary | ICD-10-CM

## 2014-06-19 LAB — CBC WITH DIFFERENTIAL/PLATELET
BASOS PCT: 1 % (ref 0.0–3.0)
Basophils Absolute: 0.1 10*3/uL (ref 0.0–0.1)
EOS ABS: 0.3 10*3/uL (ref 0.0–0.7)
Eosinophils Relative: 5.1 % — ABNORMAL HIGH (ref 0.0–5.0)
HCT: 36.3 % — ABNORMAL LOW (ref 39.0–52.0)
Hemoglobin: 11.8 g/dL — ABNORMAL LOW (ref 13.0–17.0)
Lymphocytes Relative: 29.1 % (ref 12.0–46.0)
Lymphs Abs: 1.5 10*3/uL (ref 0.7–4.0)
MCHC: 32.5 g/dL (ref 30.0–36.0)
MCV: 87.3 fl (ref 78.0–100.0)
Monocytes Absolute: 0.6 10*3/uL (ref 0.1–1.0)
Monocytes Relative: 11 % (ref 3.0–12.0)
NEUTROS PCT: 53.8 % (ref 43.0–77.0)
Neutro Abs: 2.8 10*3/uL (ref 1.4–7.7)
Platelets: 226 10*3/uL (ref 150.0–400.0)
RBC: 4.16 Mil/uL — AB (ref 4.22–5.81)
RDW: 13.8 % (ref 11.5–15.5)
WBC: 5.2 10*3/uL (ref 4.0–10.5)

## 2014-06-19 LAB — BASIC METABOLIC PANEL
BUN: 18 mg/dL (ref 6–23)
CO2: 26 mEq/L (ref 19–32)
CREATININE: 1.1 mg/dL (ref 0.4–1.5)
Calcium: 9.4 mg/dL (ref 8.4–10.5)
Chloride: 106 mEq/L (ref 96–112)
GFR: 68.7 mL/min (ref >60.00)
Glucose, Bld: 123 mg/dL — ABNORMAL HIGH (ref 70–99)
POTASSIUM: 4.2 meq/L (ref 3.5–5.1)
Sodium: 142 mEq/L (ref 135–145)

## 2014-06-19 LAB — POCT URINALYSIS DIPSTICK
BILIRUBIN UA: NEGATIVE
Blood, UA: NEGATIVE
Glucose, UA: NEGATIVE
LEUKOCYTES UA: NEGATIVE
Nitrite, UA: NEGATIVE
Spec Grav, UA: >=1.03
Urobilinogen, UA: 0.2
pH, UA: 6

## 2014-06-19 LAB — HEPATIC FUNCTION PANEL
ALT: 29 U/L (ref 0–53)
AST: 30 U/L (ref 0–37)
Albumin: 4.4 g/dL (ref 3.5–5.2)
Alkaline Phosphatase: 61 U/L (ref 39–117)
BILIRUBIN TOTAL: 0.4 mg/dL (ref 0.2–1.2)
Bilirubin, Direct: 0.1 mg/dL (ref 0.0–0.3)
Total Protein: 6.9 g/dL (ref 6.0–8.3)

## 2014-06-19 LAB — LIPID PANEL
CHOLESTEROL: 113 mg/dL (ref 0–200)
HDL: 27.7 mg/dL — ABNORMAL LOW (ref >39.00)
LDL Cholesterol: 62 mg/dL (ref 0–99)
NonHDL: 85.3
Total CHOL/HDL Ratio: 4
Triglycerides: 116 mg/dL (ref 0.0–149.0)
VLDL: 23.2 mg/dL (ref 0.0–40.0)

## 2014-06-19 LAB — TSH: TSH: 7.78 u[IU]/mL — ABNORMAL HIGH (ref 0.35–4.50)

## 2014-06-19 LAB — PSA: PSA: 0.31 ng/mL (ref 0.10–4.00)

## 2014-06-21 ENCOUNTER — Encounter: Payer: Self-pay | Admitting: Family Medicine

## 2014-06-21 ENCOUNTER — Telehealth: Payer: Self-pay

## 2014-06-21 NOTE — Telephone Encounter (Signed)
Can you please schedule appt for about Lab results.

## 2014-06-21 NOTE — Telephone Encounter (Signed)
Pt hs been scheduled

## 2014-06-25 ENCOUNTER — Ambulatory Visit (INDEPENDENT_AMBULATORY_CARE_PROVIDER_SITE_OTHER): Payer: Medicare HMO | Admitting: Family Medicine

## 2014-06-25 ENCOUNTER — Encounter: Payer: Self-pay | Admitting: Family Medicine

## 2014-06-25 VITALS — BP 120/70 | HR 84 | Temp 98.0°F | Wt 228.0 lb

## 2014-06-25 DIAGNOSIS — R7309 Other abnormal glucose: Secondary | ICD-10-CM

## 2014-06-25 DIAGNOSIS — D649 Anemia, unspecified: Secondary | ICD-10-CM

## 2014-06-25 DIAGNOSIS — E039 Hypothyroidism, unspecified: Secondary | ICD-10-CM

## 2014-06-25 DIAGNOSIS — R7303 Prediabetes: Secondary | ICD-10-CM

## 2014-06-25 DIAGNOSIS — Z79899 Other long term (current) drug therapy: Secondary | ICD-10-CM

## 2014-06-25 DIAGNOSIS — R739 Hyperglycemia, unspecified: Secondary | ICD-10-CM

## 2014-06-25 LAB — FERRITIN: FERRITIN: 9.9 ng/mL — AB (ref 22.0–322.0)

## 2014-06-25 LAB — HEMOGLOBIN A1C: Hgb A1c MFr Bld: 6.9 % — ABNORMAL HIGH (ref 4.6–6.5)

## 2014-06-25 LAB — VITAMIN B12: VITAMIN B 12: 635 pg/mL (ref 211–911)

## 2014-06-25 MED ORDER — LEVOTHYROXINE SODIUM 25 MCG PO TABS: 25.0000 ug | ORAL_TABLET | Freq: Every day | ORAL | Status: DC

## 2014-06-25 NOTE — Progress Notes (Signed)
   Subjective:    Patient ID: Kyle [name], male    DOB: [birth year], 68 y.o.   MRN: [id number]  HPI Patient recent labs is here to discuss multiple lab abnormalities recently including glucose 123, hemoglobin 11.8 and TSH elevated at 7.78.  Patient had previous elevated TSH year ago 5.91. He has had some fatigue issues recently. Denies constipation or cold intolerance..  Never treated for hypothyroidism.  History prediabetes. Recent blood sugar 123. Poor compliance with diet and exercise. No symptoms of polyuria or polydipsia.  Normocytic anemia hemoglobin 11.8 with MCV of 87.3. He had colonoscopy back in 2015 early this year which was normal. He's had slight decline in hemoglobin from 12.5-12.1-11.8 currently. No recent stool changes. No abdominal pain. No appetite or weight changes.  Past Medical History  Diagnosis Date  . GERD (gastroesophageal reflux disease)   . Cardiac arrhythmia   . Hyperlipidemia   . Hypertension   . CAD (coronary artery disease) 2011  . Myocardial infarction 2011  . COPD (chronic obstructive pulmonary disease)    Past Surgical History  Procedure Laterality Date  . Appendectomy    . Eye surgery      lasix /BIL  . Coronary angioplasty with stent placement      reports that he quit smoking about 20 years ago. His smoking use included Cigarettes. He has a 112 pack-year smoking history. He has never used smokeless tobacco. He reports that he drinks about 2.5 oz of alcohol per week. He reports that he does not use illicit drugs. family history includes Multiple sclerosis in his sister; Stroke in his father and mother. No Known Allergies    Review of Systems  Constitutional: Positive for fatigue.  Eyes: Negative for visual disturbance.  Respiratory: Negative for cough, chest tightness and shortness of breath.   Cardiovascular: Negative for chest pain, palpitations and leg swelling.  Gastrointestinal: Negative for abdominal pain and blood in stool.    Neurological: Negative for dizziness, syncope, weakness, light-headedness and headaches.       Objective:   Physical Exam  Constitutional: He appears well-developed and well-nourished.  Neck: Neck supple. No thyromegaly present.  Cardiovascular: Normal rate and regular rhythm.   Pulmonary/Chest: Effort normal and breath sounds normal. No respiratory distress. He has no wheezes. He has no rales.  Abdominal: Soft. Bowel sounds are normal. There is no tenderness.  Musculoskeletal: He exhibits no edema.          Assessment & Plan:  #1 hyperglycemia. Would recommend weight loss and reduction sugars and starches. Previous labs prediabetes range. Check A1c #2 elevated TSH. He's experienced some increased fatigue recently and suspect clinical hypothyroidism. Initiate low-dose levothyroxine 25 mcg daily and recheck TSH at follow-up 3 months #3 normocytic anemia. Recent colonoscopy normal. Hemoccult cards given. Check B12, serum iron, TIBC, ferritin.

## 2014-06-25 NOTE — Progress Notes (Signed)
Pre visit review using our clinic review tool, if applicable. No additional management support is needed unless otherwise documented below in the visit note. 

## 2014-06-26 ENCOUNTER — Other Ambulatory Visit: Payer: Self-pay

## 2014-06-26 ENCOUNTER — Other Ambulatory Visit: Payer: Self-pay | Admitting: Family Medicine

## 2014-06-26 DIAGNOSIS — D649 Anemia, unspecified: Secondary | ICD-10-CM

## 2014-06-26 LAB — IRON AND TIBC
%SAT: 13 % — ABNORMAL LOW (ref 20–55)
IRON: 51 ug/dL (ref 42–165)
TIBC: 400 ug/dL (ref 215–435)
UIBC: 349 ug/dL (ref 125–400)

## 2014-07-04 ENCOUNTER — Other Ambulatory Visit: Payer: Medicare HMO

## 2014-09-10 ENCOUNTER — Encounter: Payer: Self-pay | Admitting: Pulmonary Disease

## 2014-09-10 ENCOUNTER — Ambulatory Visit (INDEPENDENT_AMBULATORY_CARE_PROVIDER_SITE_OTHER): Payer: Medicare HMO | Admitting: Pulmonary Disease

## 2014-09-10 VITALS — BP 140/82 | HR 69 | Ht 71.0 in | Wt 231.0 lb

## 2014-09-10 DIAGNOSIS — G4733 Obstructive sleep apnea (adult) (pediatric): Secondary | ICD-10-CM

## 2014-09-10 NOTE — Patient Instructions (Signed)
Continue on cpap, and keep working on weight loss Will get you referred to the sleep center for a mask fitting. followup with me again in one year if doing well.

## 2014-09-10 NOTE — Assessment & Plan Note (Signed)
The patient is wearing C Pap compliantly by his download, and feels that it does help his sleep when he gets through the night. His download shows significant mask leak and breakthrough apnea, and he does have mornings where he feels he has not slept well. At this point, I would like for him to work more aggressively on a better fitting mask, and will arrange a session at the sleep Center for a fitting. I have also encouraged him to work on weight reduction.

## 2014-09-10 NOTE — Progress Notes (Signed)
   Subjective:    Patient ID: [name], male    DOB: [birth year], 69 y.o.   MRN: [id number]  HPI The patient comes in today for follow-up of his known obstructive sleep apnea. He is wearing C Pap compliantly by his download, but is having significant mask leak with breakthrough apneas. Some nights he feels that he does very well with the device, and others does not feel as rested because of the leaks. Has not tried to change his mask.   Review of Systems  Constitutional: Negative for fever and unexpected weight change.  HENT: Negative for congestion, dental problem, ear pain, nosebleeds, postnasal drip, rhinorrhea, sinus pressure, sneezing, sore throat and trouble swallowing.   Eyes: Negative for redness and itching.  Respiratory: Negative for cough, chest tightness, shortness of breath and wheezing.   Cardiovascular: Negative for palpitations and leg swelling.  Gastrointestinal: Negative for nausea and vomiting.  Genitourinary: Negative for dysuria.  Musculoskeletal: Negative for joint swelling.  Skin: Negative for rash.  Neurological: Negative for headaches.  Hematological: Does not bruise/bleed easily.  Psychiatric/Behavioral: Negative for dysphoric mood. The patient is not nervous/anxious.        Objective:   Physical Exam Overweight male in no acute distress Nose without purulence or discharge noted No skin breakdown or pressure necrosis from the C Pap mask Neck without lymphadenopathy or thyromegaly Lower extremities without edema, no cyanosis Alert and oriented, does not appear to be sleepy, moves all 4 extremities.       Assessment & Plan:

## 2014-09-11 ENCOUNTER — Ambulatory Visit (INDEPENDENT_AMBULATORY_CARE_PROVIDER_SITE_OTHER): Payer: Medicare HMO | Admitting: Family Medicine

## 2014-09-11 ENCOUNTER — Encounter: Payer: Self-pay | Admitting: Family Medicine

## 2014-09-11 VITALS — BP 136/74 | HR 82 | Temp 97.8°F | Wt 231.0 lb

## 2014-09-11 DIAGNOSIS — D649 Anemia, unspecified: Secondary | ICD-10-CM

## 2014-09-11 DIAGNOSIS — M7582 Other shoulder lesions, left shoulder: Secondary | ICD-10-CM

## 2014-09-11 DIAGNOSIS — E038 Other specified hypothyroidism: Secondary | ICD-10-CM

## 2014-09-11 LAB — CBC WITH DIFFERENTIAL/PLATELET
BASOS ABS: 0 10*3/uL (ref 0.0–0.1)
BASOS PCT: 0.9 % (ref 0.0–3.0)
EOS ABS: 0.3 10*3/uL (ref 0.0–0.7)
Eosinophils Relative: 6.3 % — ABNORMAL HIGH (ref 0.0–5.0)
HCT: 40.1 % (ref 39.0–52.0)
HEMOGLOBIN: 13.7 g/dL (ref 13.0–17.0)
Lymphocytes Relative: 27.2 % (ref 12.0–46.0)
Lymphs Abs: 1.4 10*3/uL (ref 0.7–4.0)
MCHC: 34.2 g/dL (ref 30.0–36.0)
MCV: 90.5 fl (ref 78.0–100.0)
MONOS PCT: 11.9 % (ref 3.0–12.0)
Monocytes Absolute: 0.6 10*3/uL (ref 0.1–1.0)
NEUTROS ABS: 2.7 10*3/uL (ref 1.4–7.7)
NEUTROS PCT: 53.7 % (ref 43.0–77.0)
Platelets: 197 10*3/uL (ref 150.0–400.0)
RBC: 4.43 Mil/uL (ref 4.22–5.81)
RDW: 14.4 % (ref 11.5–15.5)
WBC: 5 10*3/uL (ref 4.0–10.5)

## 2014-09-11 LAB — TSH: TSH: 5.34 u[IU]/mL — ABNORMAL HIGH (ref 0.35–4.50)

## 2014-09-11 MED ORDER — ATORVASTATIN CALCIUM 40 MG PO TABS: 40.0000 mg | ORAL_TABLET | Freq: Every day | ORAL | Status: DC

## 2014-09-11 NOTE — Progress Notes (Signed)
Pre visit review using our clinic review tool, if applicable. No additional management support is needed unless otherwise documented below in the visit note. 

## 2014-09-11 NOTE — Patient Instructions (Signed)
Rotator Cuff Tendinitis  Rotator cuff tendinitis is inflammation of the tough, cord-like bands that connect muscle to bone (tendons) in your rotator cuff. Your rotator cuff is the collection of all the muscles and tendons that connect your arm to your shoulder. Your rotator cuff holds the head of your upper arm bone (humerus) in the cup (fossa) of your shoulder blade (scapula). CAUSES Rotator cuff tendinitis is usually caused by overusing the joint involved.  SIGNS AND SYMPTOMS  Deep ache in the shoulder also felt on the outside upper arm over the shoulder muscle.  Point tenderness over the area that is injured.  Pain comes on gradually and becomes worse with lifting the arm to the side (abduction) or turning it inward (internal rotation).  May lead to a chronic tear: When a rotator cuff tendon becomes inflamed, it runs the risk of losing its blood supply, causing some tendon fibers to die. This increases the risk that the tendon can fray and partially or completely tear. DIAGNOSIS Rotator cuff tendinitis is diagnosed by taking a medical history, performing a physical exam, and reviewing results of imaging exams. The medical history is useful to help determine the type of rotator cuff injury. The physical exam will include looking at the injured shoulder, feeling the injured area, and watching you do range-of-motion exercises. X-ray exams are typically done to rule out other causes of shoulder pain, such as fractures. MRI is the imaging exam usually used for significant shoulder injuries. Sometimes a dye study called CT arthrogram is done, but it is not as widely used as MRI. In some institutions, special ultrasound tests may also be used to aid in the diagnosis. TREATMENT  Less Severe Cases  Use of a sling to rest the shoulder for a short period of time. Prolonged use of the sling can cause stiffness, weakness, and loss of motion of the shoulder joint.  Anti-inflammatory medicines, such as  ibuprofen or naproxen sodium, may be prescribed. More Severe Cases  Physical therapy.  Use of steroid injections into the shoulder joint.  Surgery. HOME CARE INSTRUCTIONS   Use a sling or splint until the pain decreases. Prolonged use of the sling can cause stiffness, weakness, and loss of motion of the shoulder joint.  Apply ice to the injured area:  Put ice in a plastic bag.  Place a towel between your skin and the bag.  Leave the ice on for 20 minutes, 2-3 times a day.  Try to avoid use other than gentle range of motion while your shoulder is painful. Use the shoulder and exercise only as directed by your health care provider. Stop exercises or range of motion if pain or discomfort increases, unless directed otherwise by your health care provider.  Only take over-the-counter or prescription medicines for pain, discomfort, or fever as directed by your health care provider.  If you were given a shoulder sling and straps (immobilizer), do not remove it except as directed, or until you see a health care provider for a follow-up exam. If you need to remove it, move your arm as little as possible or as directed.  You may want to sleep on several pillows at night to lessen swelling and pain. SEEK IMMEDIATE MEDICAL CARE IF:   Your shoulder pain increases or new pain develops in your arm, hand, or fingers and is not relieved with medicines.  You have new, unexplained symptoms, especially increased numbness in the hands or loss of strength.  You develop any worsening of the problems  that brought you in for care.  Your arm, hand, or fingers are numb or tingling.  Your arm, hand, or fingers are swollen, painful, or turn white or blue. MAKE SURE YOU:  Understand these instructions.  Will watch your condition.  Will get help right away if you are not doing well or get worse. Document Released: 10/10/2003 Document Revised: 05/10/2013 Document Reviewed: 03/01/2013 [facility] Patient  Information 2015 [place], [place]. This information is not intended to replace advice given to you by your health care provider. Make sure you discuss any questions you have with your health care provider.

## 2014-09-11 NOTE — Progress Notes (Signed)
Subjective:    Patient ID: Kyle [name], male    DOB: Jun 29, 1946, 69 y.o.   MRN: [id number]  HPI Patient seen today for several issues as follows  Left shoulder pain onset about one month ago. He felt this may be related to his Lipitor. He has not had any other arthralgias or myalgias. He has pain with internal and external rotation. No known injury. Pain is moderate at times but only with movement. No cervical radiculopathy symptoms. No weakness or numbness. He has not taken anything consistently for the pain. Not limiting activities much at this point  Recent hypothyroidism diagnosis. Started low-dose Levothroid 25 g daily. He is taking this regularly. He has some occasional constipation has had mild weight gain. No cold intolerance  Normocytic anemia. He did have some iron deficiency we gave Hemoccult cards. He states he mailed these but we never saw them. He had colonoscopy 09/20/2013 unremarkable. He has been taking iron supplement. Denies any abdominal pain. No melena. No hematemesis. No appetite or weight changes.  Past Medical History  Diagnosis Date  . GERD (gastroesophageal reflux disease)   . Cardiac arrhythmia   . Hyperlipidemia   . Hypertension   . CAD (coronary artery disease) 2011  . Myocardial infarction 2011  . COPD (chronic obstructive pulmonary disease)    Past Surgical History  Procedure Laterality Date  . Appendectomy    . Eye surgery      lasix /BIL  . Coronary angioplasty with stent placement      reports that he quit smoking about 20 years ago. His smoking use included Cigarettes. He has a 112 pack-year smoking history. He has never used smokeless tobacco. He reports that he drinks about 2.5 oz of alcohol per week. He reports that he does not use illicit drugs. family history includes Multiple sclerosis in his sister; Stroke in his father and mother. No Known Allergies    Review of Systems  Constitutional: Negative for fatigue.  Eyes: Negative for  visual disturbance.  Respiratory: Negative for cough, chest tightness and shortness of breath.   Cardiovascular: Negative for chest pain, palpitations and leg swelling.  Gastrointestinal: Positive for constipation. Negative for blood in stool.  Neurological: Negative for dizziness, syncope, weakness, light-headedness and headaches.       Objective:   Physical Exam  Constitutional: He is oriented to person, place, and time. He appears well-developed and well-nourished.  HENT:  Right Ear: External ear normal.  Left Ear: External ear normal.  Mouth/Throat: Oropharynx is clear and moist.  Eyes: Pupils are equal, round, and reactive to light.  Neck: Neck supple. No thyromegaly present.  Cardiovascular: Normal rate and regular rhythm.   Pulmonary/Chest: Effort normal and breath sounds normal. No respiratory distress. He has no wheezes. He has no rales.  Abdominal: Soft. There is no tenderness.  Musculoskeletal: He exhibits no edema.  Left shoulder reveals full range of motion. He has pain with external rotation, internal rotation, and abduction against resistance. No AC joint tenderness. No biceps tenderness.  Neurological: He is alert and oriented to person, place, and time.  Full strength upper extremities. Symmetric reflexes.          Assessment & Plan:  #1 hypothyroidism. Recheck TSH. Recent initiation of Levothroid  # 2 left shoulder pain. Suspect rotator cuff tendinitis. Handout given. We've offered steroid injection at this point he wishes to observe  #3 recent mild normocytic anemia. He had recent colonoscopy year ago unremarkable. Recheck CBC. If not corrected consider  GI referral

## 2014-09-13 ENCOUNTER — Other Ambulatory Visit: Payer: Self-pay

## 2014-09-13 ENCOUNTER — Other Ambulatory Visit: Payer: Self-pay | Admitting: Family Medicine

## 2014-09-13 DIAGNOSIS — E038 Other specified hypothyroidism: Secondary | ICD-10-CM

## 2014-09-13 MED ORDER — LEVOTHYROXINE SODIUM 50 MCG PO TABS: 50.0000 ug | ORAL_TABLET | Freq: Every day | ORAL | Status: DC

## 2014-09-18 ENCOUNTER — Ambulatory Visit (HOSPITAL_BASED_OUTPATIENT_CLINIC_OR_DEPARTMENT_OTHER): Payer: Medicare HMO | Attending: Pulmonary Disease | Admitting: Radiology

## 2014-09-18 DIAGNOSIS — Z9989 Dependence on other enabling machines and devices: Secondary | ICD-10-CM

## 2014-09-18 DIAGNOSIS — G4733 Obstructive sleep apnea (adult) (pediatric): Secondary | ICD-10-CM

## 2014-09-23 ENCOUNTER — Other Ambulatory Visit: Payer: Self-pay | Admitting: Family Medicine

## 2014-10-16 ENCOUNTER — Telehealth: Payer: Self-pay | Admitting: Pulmonary Disease

## 2014-10-16 NOTE — Telephone Encounter (Signed)
Called and spoke with pts wife and she stated that pt was seen by [facility] 2/16 and was given new mask.  Pt stated that the new mask is not working for him and he keeps waking up at night and is not able to sleep with this new mask.  He is going back and forth between the masks that he has but neither is helping him.  pts wife wanted to see what [facility] recs for the pt.  They are aware that [facility] will be back in the morning and we will call pt tomorrow with recs.  [place] please advise. Thanks  No Known Allergies  Current Outpatient Prescriptions on File Prior to Visit  Medication Sig Dispense Refill  . aspirin 81 MG tablet Take 81 mg by mouth daily.    Marland Kitchen atorvastatin (LIPITOR) 40 MG tablet Take 1 tablet (40 mg total) by mouth daily at 6 PM. 90 tablet 3  . benazepril-hydrochlorthiazide (LOTENSIN HCT) 20-25 MG per tablet TAKE TAKE ONE TABLET BY MOUTH EVERY DAY 90 tablet 0  . cetirizine (ZYRTEC) 10 MG tablet Take 10 mg by mouth daily.    . ferrous sulfate 325 (65 FE) MG tablet Take 325 mg by mouth 2 (two) times daily with a meal.    . levothyroxine (SYNTHROID, LEVOTHROID) 50 MCG tablet Take 1 tablet (50 mcg total) by mouth daily before breakfast. 30 tablet 5  . metoprolol tartrate (LOPRESSOR) 25 MG tablet TAKE 1 TABLET TWICE A DAY 180 tablet 2  . montelukast (SINGULAIR) 10 MG tablet TAKE 1 TABLET (10 MG TOTAL) BY MOUTH AT BEDTIME. 30 tablet 4  . Multiple Vitamin (MULTIVITAMIN) tablet Take 1 tablet by mouth daily.    Marland Kitchen omeprazole (PRILOSEC OTC) 20 MG tablet Take 20 mg by mouth daily.     No current facility-administered medications on file prior to visit.

## 2014-10-17 NOTE — Telephone Encounter (Signed)
Would refer him to sleep center for a formal mask fitting during the day (order [id number])

## 2014-10-17 NOTE — Telephone Encounter (Signed)
Spoke with pt to relay [employer].   Pt states that he used a cloth between his face and mask last night which helped greatly.  He does not wish to proceed with a mask fitting at this time.  Nothing further needed, forwarding just as fyi

## 2014-10-27 ENCOUNTER — Other Ambulatory Visit: Payer: Self-pay | Admitting: Cardiology

## 2014-12-18 ENCOUNTER — Other Ambulatory Visit: Payer: Self-pay | Admitting: Cardiology

## 2014-12-30 ENCOUNTER — Telehealth: Payer: Self-pay | Admitting: Family

## 2014-12-30 DIAGNOSIS — R059 Cough, unspecified: Secondary | ICD-10-CM

## 2014-12-30 DIAGNOSIS — R05 Cough: Secondary | ICD-10-CM

## 2014-12-30 MED ORDER — BENZONATATE 100 MG PO CAPS: 100.0000 mg | ORAL_CAPSULE | Freq: Three times a day (TID) | ORAL | Status: DC | PRN

## 2014-12-30 NOTE — Progress Notes (Signed)
We are sorry that you are not feeling well.  Here is how we plan to help!  Based on what you have shared with me it looks like you have upper respiratory tract inflammation that has resulted in a significant cough.  Inflammation and infection in the upper respiratory tract is commonly called bronchitis and has four common causes:  Allergies, Viral Infections, Acid Reflux and Bacterial Infections.  Allergies, viruses and acid reflux are treated by controlling symptoms or eliminating the cause. An example might be a cough caused by taking certain blood pressure medications. You stop the cough by changing the medication. Another example might be a cough caused by acid reflux. Controlling the reflux helps control the cough.  Based on your presentation I believe you most likely have A cough due to a virus.  This is called viral bronchitis and is best treated by rest, plenty of fluids and control of the cough.  You may use Ibuprofen or Tylenol as directed to help your symptoms.    In addition you may use A non-prescription cough medication called Robitussin DAC. Take 2 teaspoons every 8 hours or Delsym: take 2 teaspoons every 12 hours., A non-prescription cough medication called Mucinex DM: take 2 tablets every 12 hours. and A prescription cough medication called Tessalon Perles 100mg. You may take 1-2 capsules every 8 hours as needed for your cough.    HOME CARE . Only take medications as instructed by your medical team. . Complete the entire course of an antibiotic. . Drink plenty of fluids and get plenty of rest. . Avoid close contacts especially the very young and the elderly . Cover your mouth if you cough or cough into your sleeve. . Always remember to wash your hands . A steam or ultrasonic humidifier can help congestion.    GET HELP RIGHT AWAY IF: . You develop worsening fever. . You become short of breath . You cough up blood. . Your symptoms persist after you have completed your treatment  plan MAKE SURE YOU   Understand these instructions.  Will watch your condition.  Will get help right away if you are not doing well or get worse.  Your e-visit answers were reviewed by a board certified advanced clinical practitioner to complete your personal care plan.  Depending on the condition, your plan could have included both over the counter or prescription medications.  If there is a problem please reply  once you have received a response from your provider.  Your safety is important to us.  If you have drug allergies check your prescription carefully.    You can use MyChart to ask questions about today's visit, request a non-urgent call back, or ask for a work or school excuse.  You will get an e-mail in the next two days asking about your experience.  I hope that your e-visit has been valuable and will speed your recovery. Thank you for using e-visits.   

## 2015-01-02 ENCOUNTER — Ambulatory Visit (INDEPENDENT_AMBULATORY_CARE_PROVIDER_SITE_OTHER): Payer: Medicare HMO | Admitting: Family Medicine

## 2015-01-02 ENCOUNTER — Encounter: Payer: Self-pay | Admitting: Family Medicine

## 2015-01-02 VITALS — BP 130/82 | HR 85 | Temp 97.8°F | Wt 225.0 lb

## 2015-01-02 DIAGNOSIS — R05 Cough: Secondary | ICD-10-CM

## 2015-01-02 DIAGNOSIS — R059 Cough, unspecified: Secondary | ICD-10-CM

## 2015-01-02 MED ORDER — AZITHROMYCIN 250 MG PO TABS: ORAL_TABLET | ORAL | Status: AC

## 2015-01-02 NOTE — Progress Notes (Signed)
   Subjective:    Patient ID: Kyle [name], male    DOB: 1946-01-11, 69 y.o.   MRN: [id number]  HPI Acute visit. 2 week history of progressive cough. He's had some sinus congestion but mostly cough. Occasionally productive. His tried Mucinex and Robitussin without much relief. He denies any fever. No hemoptysis. No dyspnea. He had E- visit couple days ago and prescribed Tessalon which has not helped at all with his cough. This is frequently disruptive at night. He does have some increased malaise over the past couple of weeks. No recent appetite or weight changes.  Past Medical History  Diagnosis Date  . GERD (gastroesophageal reflux disease)   . Cardiac arrhythmia   . Hyperlipidemia   . Hypertension   . CAD (coronary artery disease) 2011  . Myocardial infarction 2011  . COPD (chronic obstructive pulmonary disease)    Past Surgical History  Procedure Laterality Date  . Appendectomy    . Eye surgery      lasix /BIL  . Coronary angioplasty with stent placement      reports that he quit smoking about 21 years ago. His smoking use included Cigarettes. He has a 112 pack-year smoking history. He has never used smokeless tobacco. He reports that he drinks about 2.5 oz of alcohol per week. He reports that he does not use illicit drugs. family history includes Multiple sclerosis in his sister; Stroke in his father and mother. No Known Allergies    Review of Systems  Constitutional: Positive for fatigue.  HENT: Positive for congestion. Negative for sore throat.   Respiratory: Positive for cough.   Cardiovascular: Negative for chest pain.  Neurological: Negative for headaches.       Objective:   Physical Exam  Constitutional: He appears well-developed and well-nourished.  HENT:  Mouth/Throat: Oropharynx is clear and moist.  Moderate cerumen in both canals  Neck: Neck supple.  Cardiovascular: Normal rate and regular rhythm.   Pulmonary/Chest: Effort normal. He has no wheezes.    He does have some faint crackles in the right base but none in the left  Musculoskeletal: He exhibits no edema.  Lymphadenopathy:    He has no cervical adenopathy.          Assessment & Plan:  Progressive cough. He has not had any fever but does have a few crackles right base.  This could represent atelectasis or scarring-vs early pneumonia.  Cover with Zithromax. Follow-up promptly for fever or increased shortness of breath. Consider x-ray if no improvement in one week

## 2015-01-02 NOTE — Progress Notes (Signed)
Pre visit review using our clinic review tool, if applicable. No additional management support is needed unless otherwise documented below in the visit note. 

## 2015-01-02 NOTE — Patient Instructions (Signed)
Touch base in one week if cough no better and sooner for any fever or increased shortness of breath.

## 2015-01-30 ENCOUNTER — Encounter: Payer: Self-pay | Admitting: Family Medicine

## 2015-02-04 ENCOUNTER — Other Ambulatory Visit: Payer: Self-pay | Admitting: Family Medicine

## 2015-04-12 ENCOUNTER — Ambulatory Visit (INDEPENDENT_AMBULATORY_CARE_PROVIDER_SITE_OTHER): Payer: Medicare HMO | Admitting: Cardiology

## 2015-04-12 ENCOUNTER — Encounter: Payer: Self-pay | Admitting: Cardiology

## 2015-04-12 VITALS — BP 110/72 | HR 57 | Ht 71.0 in | Wt 222.8 lb

## 2015-04-12 DIAGNOSIS — E785 Hyperlipidemia, unspecified: Secondary | ICD-10-CM | POA: Diagnosis not present

## 2015-04-12 DIAGNOSIS — I10 Essential (primary) hypertension: Secondary | ICD-10-CM | POA: Diagnosis not present

## 2015-04-12 DIAGNOSIS — I2583 Coronary atherosclerosis due to lipid rich plaque: Principal | ICD-10-CM

## 2015-04-12 DIAGNOSIS — I2510 Atherosclerotic heart disease of native coronary artery without angina pectoris: Secondary | ICD-10-CM

## 2015-04-12 NOTE — Progress Notes (Signed)
[facility]. [address], [address] [place], [place]  [address] Phone: [phone] Fax:  [phone]  Date:  04/12/2015   ID:  Kyle [name], Kyle [name] [birth year], MRN [id number]  PCP:  Eulas Post, MD   History of Present Illness: Kyle [name] is a 69 y.o. male with coronary artery disease status post non-ST elevation myocardial infarction with hyperlipidemia, hypertension, obesity here for four-month followup. Overall, doing well, no specific exertional angina, but he is having some shortness of breath unchanged. Going up stair. Enjoys playing golf, walking uphill occasionally will feel short of breath. He underwent echocardiogram on 03/31/12 that showed an ejection fraction of 50% with base to mid inferior hypokinesis. He also underwent a nuclear stress test at that time which showed overall low risk with no evidence of significant ischemia. His prior cardiac catheterization was in June of 2011. Stent was placed to the right coronary artery at that time.   He has lost approximately 10 pounds.  He still having symptoms as described above. Shortness of breath. I wonder if this more of a COPD component possibly.     Wt Readings from Last 3 Encounters:  04/12/15 222 lb 12.8 oz (101.061 kg)  01/02/15 225 lb (102.059 kg)  09/11/14 231 lb (104.781 kg)     Past Medical History  Diagnosis Date  . GERD (gastroesophageal reflux disease)   . Cardiac arrhythmia   . Hyperlipidemia   . Hypertension   . CAD (coronary artery disease) 2011  . Myocardial infarction 2011  . COPD (chronic obstructive pulmonary disease)     Past Surgical History  Procedure Laterality Date  . Appendectomy    . Eye surgery      lasix /BIL  . Coronary angioplasty with stent placement      Current Outpatient Prescriptions  Medication Sig Dispense Refill  . aspirin 81 MG tablet Take 81 mg by mouth daily.    Marland Kitchen atorvastatin (LIPITOR) 40 MG tablet Take 1 tablet (40 mg total) by mouth daily at 6 PM. 90 tablet 3   . benazepril-hydrochlorthiazide (LOTENSIN HCT) 20-25 MG per tablet TAKE TAKE ONE TABLET BY MOUTH EVERY DAY 90 tablet 1  . cetirizine (ZYRTEC) 10 MG tablet Take 10 mg by mouth daily.    Marland Kitchen levothyroxine (SYNTHROID, LEVOTHROID) 50 MCG tablet TAKE 1 TABLET (50 MCG TOTAL) BY MOUTH DAILY BEFORE BREAKFAST. 30 tablet 5  . metoprolol tartrate (LOPRESSOR) 25 MG tablet TAKE 1 TABLET TWICE A DAY 180 tablet 1  . Multiple Vitamin (MULTIVITAMIN) tablet Take 1 tablet by mouth daily.    Marland Kitchen omeprazole (PRILOSEC OTC) 20 MG tablet Take 20 mg by mouth daily.     No current facility-administered medications for this visit.    Allergies:   No Known Allergies  Social History:  The patient  reports that he quit smoking about 21 years ago. His smoking use included Cigarettes. He has a 112 pack-year smoking history. He has never used smokeless tobacco. He reports that he drinks about 2.5 oz of alcohol per week. He reports that he does not use illicit drugs.   Family History  Problem Relation Age of Onset  . Stroke Mother   . Stroke Father   . Multiple sclerosis Sister     ROS:  Please see the history of present illness.   Denies any fevers, chills, orthopnea, PND, syncope   All other systems reviewed and negative.   PHYSICAL EXAM: VS:  BP 110/72 mmHg  Pulse 57  Ht 5\' 11"  (1.803 m)  Wt 222 lb 12.8 oz (101.061 kg)  BMI 31.09 kg/m2 Well nourished, well developed, in no acute distress HEENT: normal, [place]/AT, EOMI Neck: no JVD, normal carotid upstroke, no bruit Cardiac:  normal S1, S2; RRR; no murmur Lungs:  clear to auscultation bilaterally, no wheezing, rhonchi or rales Abd: soft, nontender, no hepatomegaly, no bruits Ext: no edema, 2+ distal pulses Skin: warm and dry GU: deferred Neuro: no focal abnormalities noted, AAO x 3  EKG: Today: 04/12/15-size bradycardia rate 57 with no other abnormalities personally viewed- 03/07/14-sinus bradycardia rate 59 with PVCs, 2 seen on EKG    LABS: LDL 59 on  06/09/13  ASSESSMENT AND PLAN:  1. Coronary artery disease without angina-currently doing well. Secondary prevention. 2. Hypertension-well-controlled. 3. Hyperlipidemia-atorvastatin 40. LDL goal less than 70. 4. PVC's-Asymptomatic. 5. OSA - he does feel decreased shortness of breath after wearing mask. This is being managed by pulmonary.  6. COPD-many years of smoking. Pulmonary. 7. One year followup  Signed, Candee Furbish, MD [facility]  04/12/2015 11:30 AM

## 2015-04-12 NOTE — Patient Instructions (Signed)
Medication Instructions:  The current medical regimen is effective;  continue present plan and medications.  Follow-Up: Follow up in 1 year with Dr. Skains.  You will receive a letter in the mail 2 months before you are due.  Please call us when you receive this letter to schedule your follow up appointment.  Thank you for choosing [facility]!!     

## 2015-04-24 ENCOUNTER — Encounter: Payer: Self-pay | Admitting: Internal Medicine

## 2015-04-24 ENCOUNTER — Ambulatory Visit (INDEPENDENT_AMBULATORY_CARE_PROVIDER_SITE_OTHER): Payer: Medicare HMO | Admitting: Internal Medicine

## 2015-04-24 VITALS — BP 126/70 | HR 54 | Ht 71.0 in | Wt 220.4 lb

## 2015-04-24 DIAGNOSIS — G4733 Obstructive sleep apnea (adult) (pediatric): Secondary | ICD-10-CM

## 2015-04-24 NOTE — Progress Notes (Signed)
   Subjective:    Patient ID: Kyle [name], male    DOB: [birth year], 69 y.o.   MRN: [id number]  HPI  09/10/14   Dr Gwenette Greet The patient comes in today for follow-up of his known obstructive sleep apnea. He is wearing C Pap compliantly by his download, but is having significant mask leak with breakthrough apneas. Some nights he feels that he does very well with the device, and others does not feel as rested because of the leaks. Has not tried to change his mask.  04/24/15- 69 yoM former smoker followed for OSA, COPD Unattended Home Sleep Test-01/05/2014, moderate OSA, AHI 19/hour, weight 223 pounds FOLLOWS FOR: pt states he is on maybe his 3rd different type of mask and feels like the seal is still breaking. pt using CPAP auto 5-20 every night for about 5 hours. n c/o with the pressure. DME: [facility]. Download shows good compliance with residual AHI 7.4 per hour and mean pressure 8.4. Now on his third mask, still dealing with leaks. Sleep quality is restless. Dislikes nasal sprays but complains of chronic nasal congestion. Uses Singulair.  Review of Systems  Constitutional: Negative for fever and unexpected weight change.  HENT: Negative for congestion, dental problem, ear pain, nosebleeds, postnasal drip, rhinorrhea, sinus pressure, sneezing, sore throat and trouble swallowing.   Eyes: Negative for redness and itching.  Respiratory: Negative for cough, chest tightness, shortness of breath and wheezing.   Cardiovascular: Negative for palpitations and leg swelling.  Gastrointestinal: Negative for nausea and vomiting.  Genitourinary: Negative for dysuria.  Musculoskeletal: Negative for joint swelling.  Skin: Negative for rash.  Neurological: Negative for headaches.  Hematological: Does not bruise/bleed easily.  Psychiatric/Behavioral: Negative for dysphoric mood. The patient is not nervous/anxious.      Objective:  OBJ- Physical Exam General- Alert, Oriented, Affect-appropriate, Distress- none  acute Skin- rash-none, lesions- none, excoriation- none Lymphadenopathy- none Head- atraumatic            Eyes- Gross vision intact, PERRLA, conjunctivae and secretions clear            Ears- Hearing, canals-normal            Nose- Clear, no-Septal dev, mucus, polyps, erosion, perforation, + stuffy             Throat- Mallampati III , mucosa clear , drainage- none, tonsils- atrophic Neck- flexible , trachea midline, no stridor , thyroid nl, carotid no bruit Chest - symmetrical excursion , unlabored           Heart/CV- RRR , no murmur , no gallop  , no rub, nl s1 s2                           - JVD- none , edema- none, stasis changes- none, varices- none           Lung- + coarse breath sounds, wheeze- none, cough- none , dullness-none, rub- none           Chest wall-  Abd-  Br/ Gen/ Rectal- Not done, not indicated Extrem- cyanosis- none, clubbing, none, atrophy- none, strength- nl Neuro- grossly intact to observation      Assessment & Plan:

## 2015-04-24 NOTE — Patient Instructions (Signed)
Order- DME  [facility]  Change CPAP auto range to 5-12      Dx OSA  Please call as needed

## 2015-04-28 NOTE — Assessment & Plan Note (Signed)
AHI of 7.4 represents a little less control but I would like but he is already having too much mask leak. Need to avoid higher pressures until we can get him more comfortable mask solution. Plan-try reducing peak pressure to reduce leak without sacrificing more control.

## 2015-04-29 ENCOUNTER — Ambulatory Visit (INDEPENDENT_AMBULATORY_CARE_PROVIDER_SITE_OTHER): Payer: Medicare HMO | Admitting: Family Medicine

## 2015-04-29 DIAGNOSIS — Z23 Encounter for immunization: Secondary | ICD-10-CM | POA: Diagnosis not present

## 2015-05-07 ENCOUNTER — Encounter: Payer: Self-pay | Admitting: Internal Medicine

## 2015-05-10 ENCOUNTER — Other Ambulatory Visit: Payer: Self-pay | Admitting: Cardiology

## 2015-05-21 DIAGNOSIS — G4733 Obstructive sleep apnea (adult) (pediatric): Secondary | ICD-10-CM | POA: Diagnosis not present

## 2015-06-14 ENCOUNTER — Other Ambulatory Visit: Payer: Self-pay | Admitting: Family Medicine

## 2015-06-14 DIAGNOSIS — G4733 Obstructive sleep apnea (adult) (pediatric): Secondary | ICD-10-CM | POA: Diagnosis not present

## 2015-06-16 ENCOUNTER — Other Ambulatory Visit: Payer: Self-pay | Admitting: Cardiology

## 2015-07-05 DIAGNOSIS — G4733 Obstructive sleep apnea (adult) (pediatric): Secondary | ICD-10-CM | POA: Diagnosis not present

## 2015-08-26 ENCOUNTER — Ambulatory Visit (INDEPENDENT_AMBULATORY_CARE_PROVIDER_SITE_OTHER): Payer: Medicare HMO | Admitting: Internal Medicine

## 2015-08-26 ENCOUNTER — Encounter: Payer: Self-pay | Admitting: Internal Medicine

## 2015-08-26 VITALS — BP 134/80 | HR 76 | Ht 71.0 in | Wt 227.0 lb

## 2015-08-26 DIAGNOSIS — G4733 Obstructive sleep apnea (adult) (pediatric): Secondary | ICD-10-CM

## 2015-08-26 NOTE — Patient Instructions (Signed)
We can leave CPAP auto 5-12/ [facility]      Please call as needed

## 2015-08-26 NOTE — Progress Notes (Signed)
Subjective:    Patient ID: Kyle [name], male    DOB: Dec 04, 1945, 70 y.o.   MRN: [id number]  HPI  09/10/14   Dr Gwenette Greet The patient comes in today for follow-up of his known obstructive sleep apnea. He is wearing C Pap compliantly by his download, but is having significant mask leak with breakthrough apneas. Some nights he feels that he does very well with the device, and others does not feel as rested because of the leaks. Has not tried to change his mask.  04/24/15- 69 yoM former smoker followed for OSA, COPD Unattended Home Sleep Test-01/05/2014, moderate OSA, AHI 19/hour, weight 223 pounds FOLLOWS FOR: pt states he is on maybe his 3rd different type of mask and feels like the seal is still breaking. pt using CPAP auto 5-20 every night for about 5 hours. n c/o with the pressure. DME: [facility]. Download shows good compliance with residual AHI 7.4 per hour and mean pressure 8.4. Now on his third mask, still dealing with leaks. Sleep quality is restless. Dislikes nasal sprays but complains of chronic nasal congestion. Uses Singulair.  08/26/15-70 year old male former smoker followed for OSA, COPD CPAP auto 5-12/? [facility] FOLLOWS FOR: pt wearing cpap nightly, tolerating cpap well.  DME: [facility]. cpap download printed.  Download confirms excellent compliance and adequate control. He is comfortable with pressure and feels quality of life is better with CPAP.  Review of Systems  Constitutional: Negative for fever and unexpected weight change.  HENT: Negative for congestion, dental problem, ear pain, nosebleeds, postnasal drip, rhinorrhea, sinus pressure, sneezing, sore throat and trouble swallowing.   Eyes: Negative for redness and itching.  Respiratory: Negative for cough, chest tightness, shortness of breath and wheezing.   Cardiovascular: Negative for palpitations and leg swelling.  Gastrointestinal: Negative for nausea and vomiting.  Genitourinary: Negative for dysuria.  Musculoskeletal:  Negative for joint swelling.  Skin: Negative for rash.  Neurological: Negative for headaches.  Hematological: Does not bruise/bleed easily.  Psychiatric/Behavioral: Negative for dysphoric mood. The patient is not nervous/anxious.      Objective:  OBJ- Physical Exam General- Alert, Oriented, Affect-appropriate, Distress- none acute Skin- rash-none, lesions- none, excoriation- none Lymphadenopathy- none Head- atraumatic            Eyes- Gross vision intact, PERRLA, conjunctivae and secretions clear            Ears- Hearing, canals-normal            Nose- Clear, no-Septal dev, mucus, polyps, erosion, perforation, + stuffy             Throat- Mallampati III , mucosa clear , drainage- none, tonsils- atrophic Neck- flexible , trachea midline, no stridor , thyroid nl, carotid no bruit Chest - symmetrical excursion , unlabored           Heart/CV- RRR , no murmur , no gallop  , no rub, nl s1 s2                           - JVD- none , edema- none, stasis changes- none, varices- none           Lung- clear, wheeze- none, cough- none , dullness-none, rub- none           Chest wall-  Abd-  Br/ Gen/ Rectal- Not done, not indicated Extrem- cyanosis- none, clubbing, none, atrophy- none, strength- nl Neuro- grossly intact to observation      Assessment & Plan:

## 2015-09-01 NOTE — Assessment & Plan Note (Signed)
He is quite satisfied with current pressure and using CPAP regularly. He feels that has helped him

## 2015-09-06 ENCOUNTER — Encounter: Payer: Self-pay | Admitting: Internal Medicine

## 2015-10-02 DIAGNOSIS — G4733 Obstructive sleep apnea (adult) (pediatric): Secondary | ICD-10-CM | POA: Diagnosis not present

## 2015-11-13 ENCOUNTER — Other Ambulatory Visit: Payer: Self-pay | Admitting: Family Medicine

## 2015-11-14 ENCOUNTER — Other Ambulatory Visit: Payer: Self-pay | Admitting: Family Medicine

## 2015-11-20 DIAGNOSIS — G4733 Obstructive sleep apnea (adult) (pediatric): Secondary | ICD-10-CM | POA: Diagnosis not present

## 2015-12-12 DIAGNOSIS — G4733 Obstructive sleep apnea (adult) (pediatric): Secondary | ICD-10-CM | POA: Diagnosis not present

## 2016-01-01 ENCOUNTER — Telehealth: Payer: Self-pay

## 2016-01-01 NOTE — Telephone Encounter (Signed)
Patient is on the list for [employer] 2017 and may be a good candidate for an AWV in 2017.  

## 2016-01-07 DIAGNOSIS — G4733 Obstructive sleep apnea (adult) (pediatric): Secondary | ICD-10-CM | POA: Diagnosis not present

## 2016-01-09 NOTE — Telephone Encounter (Signed)
fup with Kyle [name], Stated that Kyle [name] needs to have a visit to check his thyroid and she will make a medical apt for him and then will discuss completion of the AWV.

## 2016-01-09 NOTE — Telephone Encounter (Signed)
fup with Kyle [name] regarding AWV; Stated wife generally sets apt up and can call back later this pm to schedule. They both need AWV.

## 2016-02-14 DIAGNOSIS — B0231 Zoster conjunctivitis: Secondary | ICD-10-CM | POA: Diagnosis not present

## 2016-02-14 DIAGNOSIS — H17823 Peripheral opacity of cornea, bilateral: Secondary | ICD-10-CM | POA: Diagnosis not present

## 2016-02-14 DIAGNOSIS — H35052 Retinal neovascularization, unspecified, left eye: Secondary | ICD-10-CM | POA: Diagnosis not present

## 2016-02-24 DIAGNOSIS — H16222 Keratoconjunctivitis sicca, not specified as Sjogren's, left eye: Secondary | ICD-10-CM | POA: Diagnosis not present

## 2016-02-24 DIAGNOSIS — B0052 Herpesviral keratitis: Secondary | ICD-10-CM | POA: Diagnosis not present

## 2016-02-24 DIAGNOSIS — Z8249 Family history of ischemic heart disease and other diseases of the circulatory system: Secondary | ICD-10-CM | POA: Diagnosis not present

## 2016-02-24 DIAGNOSIS — Z955 Presence of coronary angioplasty implant and graft: Secondary | ICD-10-CM | POA: Diagnosis not present

## 2016-02-24 DIAGNOSIS — Z961 Presence of intraocular lens: Secondary | ICD-10-CM | POA: Diagnosis not present

## 2016-02-24 DIAGNOSIS — H4302 Vitreous prolapse, left eye: Secondary | ICD-10-CM | POA: Insufficient documentation

## 2016-02-24 DIAGNOSIS — B0233 Zoster keratitis: Secondary | ICD-10-CM | POA: Insufficient documentation

## 2016-02-24 DIAGNOSIS — H59812 Chorioretinal scars after surgery for detachment, left eye: Secondary | ICD-10-CM | POA: Diagnosis not present

## 2016-02-24 DIAGNOSIS — H1712 Central corneal opacity, left eye: Secondary | ICD-10-CM | POA: Diagnosis not present

## 2016-02-24 DIAGNOSIS — H40053 Ocular hypertension, bilateral: Secondary | ICD-10-CM | POA: Diagnosis not present

## 2016-02-26 DIAGNOSIS — H59812 Chorioretinal scars after surgery for detachment, left eye: Secondary | ICD-10-CM | POA: Insufficient documentation

## 2016-02-26 DIAGNOSIS — H1712 Central corneal opacity, left eye: Secondary | ICD-10-CM | POA: Insufficient documentation

## 2016-02-26 DIAGNOSIS — H40053 Ocular hypertension, bilateral: Secondary | ICD-10-CM | POA: Insufficient documentation

## 2016-03-02 ENCOUNTER — Other Ambulatory Visit (INDEPENDENT_AMBULATORY_CARE_PROVIDER_SITE_OTHER): Payer: Medicare HMO

## 2016-03-02 DIAGNOSIS — Z0000 Encounter for general adult medical examination without abnormal findings: Secondary | ICD-10-CM | POA: Diagnosis not present

## 2016-03-02 LAB — CBC WITH DIFFERENTIAL/PLATELET
BASOS ABS: 0 10*3/uL (ref 0.0–0.1)
Basophils Relative: 0.4 % (ref 0.0–3.0)
EOS ABS: 0.2 10*3/uL (ref 0.0–0.7)
Eosinophils Relative: 5.8 % — ABNORMAL HIGH (ref 0.0–5.0)
HCT: 38.7 % — ABNORMAL LOW (ref 39.0–52.0)
HEMOGLOBIN: 12.9 g/dL — AB (ref 13.0–17.0)
LYMPHS ABS: 1.1 10*3/uL (ref 0.7–4.0)
Lymphocytes Relative: 26.8 % (ref 12.0–46.0)
MCHC: 33.4 g/dL (ref 30.0–36.0)
MCV: 92.9 fl (ref 78.0–100.0)
MONO ABS: 0.5 10*3/uL (ref 0.1–1.0)
Monocytes Relative: 11.8 % (ref 3.0–12.0)
NEUTROS PCT: 55.2 % (ref 43.0–77.0)
Neutro Abs: 2.4 10*3/uL (ref 1.4–7.7)
Platelets: 187 10*3/uL (ref 150.0–400.0)
RBC: 4.17 Mil/uL — AB (ref 4.22–5.81)
RDW: 13 % (ref 11.5–15.5)
WBC: 4.3 10*3/uL (ref 4.0–10.5)

## 2016-03-02 LAB — PSA: PSA: 0.39 ng/mL (ref 0.10–4.00)

## 2016-03-02 LAB — LIPID PANEL
CHOL/HDL RATIO: 4
CHOLESTEROL: 98 mg/dL (ref 0–200)
HDL: 27.6 mg/dL — ABNORMAL LOW (ref >39.00)
LDL Cholesterol: 55 mg/dL (ref 0–99)
NonHDL: 70.36
Triglycerides: 76 mg/dL (ref 0.0–149.0)
VLDL: 15.2 mg/dL (ref 0.0–40.0)

## 2016-03-02 LAB — HEPATIC FUNCTION PANEL
ALT: 24 U/L (ref 0–53)
AST: 25 U/L (ref 0–37)
Albumin: 4.4 g/dL (ref 3.5–5.2)
Alkaline Phosphatase: 60 U/L (ref 39–117)
BILIRUBIN DIRECT: 0.1 mg/dL (ref 0.0–0.3)
BILIRUBIN TOTAL: 0.4 mg/dL (ref 0.2–1.2)
Total Protein: 7.1 g/dL (ref 6.0–8.3)

## 2016-03-02 LAB — BASIC METABOLIC PANEL
BUN: 20 mg/dL (ref 6–23)
CALCIUM: 9.7 mg/dL (ref 8.4–10.5)
CO2: 30 mEq/L (ref 19–32)
CREATININE: 0.97 mg/dL (ref 0.40–1.50)
Chloride: 104 mEq/L (ref 96–112)
GFR: 81.52 mL/min (ref >60.00)
GLUCOSE: 106 mg/dL — AB (ref 70–99)
Potassium: 4.4 mEq/L (ref 3.5–5.1)
Sodium: 140 mEq/L (ref 135–145)

## 2016-03-02 LAB — TSH: TSH: 2.7 u[IU]/mL (ref 0.35–4.50)

## 2016-03-04 DIAGNOSIS — H40053 Ocular hypertension, bilateral: Secondary | ICD-10-CM | POA: Diagnosis not present

## 2016-03-04 DIAGNOSIS — H59812 Chorioretinal scars after surgery for detachment, left eye: Secondary | ICD-10-CM | POA: Diagnosis not present

## 2016-03-04 DIAGNOSIS — H4302 Vitreous prolapse, left eye: Secondary | ICD-10-CM | POA: Diagnosis not present

## 2016-03-04 DIAGNOSIS — H16222 Keratoconjunctivitis sicca, not specified as Sjogren's, left eye: Secondary | ICD-10-CM | POA: Diagnosis not present

## 2016-03-04 DIAGNOSIS — H1712 Central corneal opacity, left eye: Secondary | ICD-10-CM | POA: Diagnosis not present

## 2016-03-04 DIAGNOSIS — B0052 Herpesviral keratitis: Secondary | ICD-10-CM | POA: Diagnosis not present

## 2016-03-06 ENCOUNTER — Other Ambulatory Visit: Payer: Medicare HMO

## 2016-03-12 DIAGNOSIS — G4733 Obstructive sleep apnea (adult) (pediatric): Secondary | ICD-10-CM | POA: Diagnosis not present

## 2016-03-13 ENCOUNTER — Encounter: Payer: Self-pay | Admitting: Family Medicine

## 2016-03-13 ENCOUNTER — Ambulatory Visit (INDEPENDENT_AMBULATORY_CARE_PROVIDER_SITE_OTHER): Payer: Medicare HMO | Admitting: Family Medicine

## 2016-03-13 VITALS — BP 120/78 | HR 85 | Temp 98.4°F | Ht 70.25 in | Wt 221.1 lb

## 2016-03-13 DIAGNOSIS — Z23 Encounter for immunization: Secondary | ICD-10-CM

## 2016-03-13 DIAGNOSIS — L570 Actinic keratosis: Secondary | ICD-10-CM | POA: Diagnosis not present

## 2016-03-13 DIAGNOSIS — H6123 Impacted cerumen, bilateral: Secondary | ICD-10-CM

## 2016-03-13 DIAGNOSIS — Z0001 Encounter for general adult medical examination with abnormal findings: Secondary | ICD-10-CM

## 2016-03-13 NOTE — Progress Notes (Signed)
Pre visit review using our clinic review tool, if applicable. No additional management support is needed unless otherwise documented below in the visit note. 

## 2016-03-13 NOTE — Patient Instructions (Signed)
Actinic Keratosis Actinic keratosis is a precancerous growth on the skin. This means it could develop into skin cancer if it is not treated. About 1% of actinic keratoses turn into skin cancer within a year. It is important to have all such growths removed to prevent them from developing into skin cancer. CAUSES  Actinic keratosis is caused by getting too much ultraviolet (UV) radiation from the sun or other UV light sources. RISK FACTORS Factors that increase your chances of getting actinic keratosis include:  Having light-colored skin and blue eyes.  Having blonde or red hair.  Spending a lot of time in the sun.  Age. The risk of actinic keratosis increases with age. SYMPTOMS  Actinic keratosis growths look like scaly, rough spots of skin. They can be as small as a pinhead or as big as a quarter. They may itch, hurt, or feel sensitive. Sometimes there is a little tag of pink or gray skin growing off them. In some cases, actinic keratoses are easier felt than seen. They do not go away with the use of moisturizing lotions or creams. Actinic keratoses appear most often on areas of skin that get a lot of sun exposure. These areas include the:  Scalp.  Face.  Ears.  Lips.  Upper back.  Backs of the hands.  Forearms. DIAGNOSIS  Your health care provider can usually tell what is wrong by performing a physical exam. A tissue sample (biopsy) may also be taken and examined under a microscope. TREATMENT  Actinic keratosis can be treated several ways. Most treatments can be done in your health care provider's office. Treatment options may include:  Curettage. A tool is used to gently scrape off the growth.  Cryosurgery. Liquid nitrogen is applied to the growth to freeze it. The growth eventually falls off the skin.  Medicated creams, such as 5-fluorouracil or imiquimod. The medicine destroys the cells in the growth.  Chemical peels. Chemicals are applied to the growth and the outer  layers of skin are peeled off.  Photodynamic therapy. A drug that makes your skin more sensitive to light is applied to the skin. A strong, blue light is aimed at the skin and destroys the growth. PREVENTION  To prevent future sun damage:  Try to avoid the sun between 10:00 a.m. and 4:00 p.m. when it is the strongest.  Use a sunscreen or sunblock with SPF 30 or greater.  Apply sunscreen at least 30 minutes before exposure to the sun.  Always wear protective hats, clothing, and sunglasses with UV protection.  Avoid medicines, herbs, and foods that increase your sensitivity to sunlight.  Avoid tanning beds. HOME CARE INSTRUCTIONS   If your skin was covered with a bandage, change and remove the bandage as directed by your health care provider.  Keep the treated area dry as directed by your health care provider.  Apply any creams as prescribed by your health care provider. Follow the directions carefully.  Check your skin regularly for any changes.  Visit a skin doctor (dermatologist) every year for a skin exam. SEEK MEDICAL CARE IF:   Your skin does not heal and becomes irritated, red, or bleeds.  You notice any changes or new growths on your skin.   This information is not intended to replace advice given to you by your health care provider. Make sure you discuss any questions you have with your health care provider.   Document Released: 10/16/2008 Document Revised: 08/10/2014 Document Reviewed: 08/31/2011 Elsevier Interactive Patient Education 2016 Elsevier   Inc.  

## 2016-03-13 NOTE — Progress Notes (Signed)
Subjective:     Patient ID: Kyle [name], male   DOB: Nov 22, 1945, 70 y.o.   MRN: [id number]  HPI  Patient seen for complete physical Chronic problems include history of obesity, CAD, hypertension, obstructive sleep apnea, hypothyroidism, hyperlipidemia, and prediabetes. Denies any recent chest pains. He does have some dyspnea with things like climbing stairs and climbing up hills. He realizes may be partly deconditioning.  Has never had any chest pressure recently. He sees cardiologist regularly.  Superficial laceration left middle finger a few days ago. Last tetanus unknown. Patient's had some progressive scaly rash dorsum right ear. Frequent sun exposure. Wears a cap and uses sunblock now He's had previous Pneumovax but needs Prevnar. Colonoscopy up-to-date. He's had previous shingles vaccine  Past Medical History:  Diagnosis Date  . CAD (coronary artery disease) 2011  . Cardiac arrhythmia   . COPD (chronic obstructive pulmonary disease) ([place])   . GERD (gastroesophageal reflux disease)   . Hyperlipidemia   . Hypertension   . Myocardial infarction [facility]) 2011   Past Surgical History:  Procedure Laterality Date  . APPENDECTOMY    . CORONARY ANGIOPLASTY WITH STENT PLACEMENT    . EYE SURGERY     lasix /BIL    reports that he quit smoking about 22 years ago. His smoking use included Cigarettes. He has a 112.00 pack-year smoking history. He has never used smokeless tobacco. He reports that he drinks about 2.5 oz of alcohol per week . He reports that he does not use drugs. family history includes Multiple sclerosis in his sister; Stroke in his father and mother. No Known Allergies   Review of Systems  Constitutional: Negative for activity change, appetite change, fatigue and fever.  HENT: Negative for congestion, ear pain and trouble swallowing.   Eyes: Negative for pain and visual disturbance.  Respiratory: Positive for shortness of breath (with exertion, as per history of  present illness). Negative for cough, chest tightness and wheezing.   Cardiovascular: Negative for chest pain, palpitations and leg swelling.  Gastrointestinal: Negative for abdominal distention, abdominal pain, blood in stool, constipation, diarrhea, nausea, rectal pain and vomiting.  Genitourinary: Negative for dysuria, hematuria and testicular pain.  Musculoskeletal: Negative for arthralgias and joint swelling.  Skin: Negative for rash.  Neurological: Negative for dizziness, syncope, weakness, light-headedness and headaches.  Hematological: Negative for adenopathy.  Psychiatric/Behavioral: Negative for confusion and dysphoric mood.       Objective:   Physical Exam  Constitutional: He is oriented to person, place, and time. He appears well-developed and well-nourished.  HENT:  Mouth/Throat: Oropharynx is clear and moist.  Bilateral cerumen impactions. Right canal fully removed with curette. We removed large amount of cerumen left canal with curette and required irrigation for total removal  Neck: Neck supple. No thyromegaly present.  Cardiovascular: Normal rate and regular rhythm.  Exam reveals no gallop.   Pulmonary/Chest: Effort normal and breath sounds normal. No respiratory distress. He has no wheezes. He has no rales.  Abdominal: Soft. Bowel sounds are normal. He exhibits no distension and no mass. There is no tenderness. There is no rebound and no guarding.  Musculoskeletal: He exhibits no edema.  Lymphadenopathy:    He has no cervical adenopathy.  Neurological: He is alert and oriented to person, place, and time. No cranial nerve deficit.  Skin:  Multiple scattered seborrheic keratoses on his trunk and extremities Dorsum right ear thickened hyperkeratotic area. No ulceration. No nodular growth.  Psychiatric: He has a normal mood and affect. His behavior  is normal.       Assessment:     #1 physical exam. Patient needs Prevnar 13. Also needs tetanus.. Other immunizations  up-to-date. Colonoscopy up-to-date  #2 actinic keratosis right ear  #3 bilateral cerumen impactions    Plan:     -Reviewed labs with patient. Minimally elevated glucose 106. Other labs stable -Recommend regular walking for exercise and weight loss -Reminder for flu vaccination this fall -Prevnar 13 and tetanus booster given -Cerumen impaction removal as above with curette -Discussed risk and benefits of treatment of actinic keratosis with liquid nitrogen. Treated right ear without difficulty -Recommend sun protection at all times. Regular use of sunblock when out playing golf  Eulas Post MD [facility]

## 2016-03-22 ENCOUNTER — Other Ambulatory Visit: Payer: Self-pay | Admitting: Family Medicine

## 2016-04-02 DIAGNOSIS — H1712 Central corneal opacity, left eye: Secondary | ICD-10-CM | POA: Diagnosis not present

## 2016-04-02 DIAGNOSIS — H4302 Vitreous prolapse, left eye: Secondary | ICD-10-CM | POA: Diagnosis not present

## 2016-04-02 DIAGNOSIS — H16222 Keratoconjunctivitis sicca, not specified as Sjogren's, left eye: Secondary | ICD-10-CM | POA: Diagnosis not present

## 2016-04-02 DIAGNOSIS — H35342 Macular cyst, hole, or pseudohole, left eye: Secondary | ICD-10-CM | POA: Insufficient documentation

## 2016-04-02 DIAGNOSIS — H59812 Chorioretinal scars after surgery for detachment, left eye: Secondary | ICD-10-CM | POA: Diagnosis not present

## 2016-04-02 DIAGNOSIS — B0233 Zoster keratitis: Secondary | ICD-10-CM | POA: Diagnosis not present

## 2016-04-24 ENCOUNTER — Other Ambulatory Visit: Payer: Self-pay | Admitting: Family Medicine

## 2016-04-27 ENCOUNTER — Ambulatory Visit (INDEPENDENT_AMBULATORY_CARE_PROVIDER_SITE_OTHER): Payer: Medicare HMO | Admitting: Cardiology

## 2016-04-27 ENCOUNTER — Encounter: Payer: Self-pay | Admitting: Cardiology

## 2016-04-27 ENCOUNTER — Encounter: Payer: Self-pay | Admitting: *Deleted

## 2016-04-27 VITALS — BP 118/70 | HR 54 | Ht 70.5 in | Wt 221.4 lb

## 2016-04-27 DIAGNOSIS — E785 Hyperlipidemia, unspecified: Secondary | ICD-10-CM | POA: Diagnosis not present

## 2016-04-27 DIAGNOSIS — R0602 Shortness of breath: Secondary | ICD-10-CM | POA: Diagnosis not present

## 2016-04-27 DIAGNOSIS — Z01810 Encounter for preprocedural cardiovascular examination: Secondary | ICD-10-CM

## 2016-04-27 DIAGNOSIS — I200 Unstable angina: Secondary | ICD-10-CM | POA: Diagnosis not present

## 2016-04-27 DIAGNOSIS — I2583 Coronary atherosclerosis due to lipid rich plaque: Secondary | ICD-10-CM

## 2016-04-27 DIAGNOSIS — I10 Essential (primary) hypertension: Secondary | ICD-10-CM | POA: Diagnosis not present

## 2016-04-27 DIAGNOSIS — I2510 Atherosclerotic heart disease of native coronary artery without angina pectoris: Secondary | ICD-10-CM | POA: Diagnosis not present

## 2016-04-27 LAB — BASIC METABOLIC PANEL
BUN: 18 mg/dL (ref 7–25)
CO2: 29 mmol/L (ref 20–31)
Calcium: 9.9 mg/dL (ref 8.6–10.3)
Chloride: 101 mmol/L (ref 98–110)
Creat: 1 mg/dL (ref 0.70–1.25)
Glucose, Bld: 111 mg/dL — ABNORMAL HIGH (ref 65–99)
POTASSIUM: 4.3 mmol/L (ref 3.5–5.3)
Sodium: 139 mmol/L (ref 135–146)

## 2016-04-27 LAB — CBC
HEMATOCRIT: 38.1 % — AB (ref 38.5–50.0)
Hemoglobin: 12.8 g/dL — ABNORMAL LOW (ref 13.2–17.1)
MCH: 31.2 pg (ref 27.0–33.0)
MCHC: 33.6 g/dL (ref 32.0–36.0)
MCV: 92.9 fL (ref 80.0–100.0)
MPV: 9.5 fL (ref 7.5–12.5)
PLATELETS: 202 10*3/uL (ref 140–400)
RBC: 4.1 MIL/uL — ABNORMAL LOW (ref 4.20–5.80)
RDW: 13.3 % (ref 11.0–15.0)
WBC: 5.2 10*3/uL (ref 3.8–10.8)

## 2016-04-27 LAB — PROTIME-INR
INR: 1.1
Prothrombin Time: 12 s — ABNORMAL HIGH (ref 9.0–11.5)

## 2016-04-27 NOTE — Progress Notes (Signed)
[facility]. [address] [place], [place]  [address] Phone: [phone] Fax:  [phone]  Date:  04/27/2016   ID:  Sushant, Vasas 07-02-1946, MRN [id number]  PCP:  Eulas Post, MD   History of Present Illness: Kyle [name] is a 70 y.o. male with coronary artery disease status post non-ST elevation myocardial infarction with hyperlipidemia, hypertension, obesity here for  Followup.  He has been noticing more increasing/significant shortness of breath with activity concerning for anginal symptoms. It is hard to distinguish whether or not this is secondary to COPD. No chest pain.  Going up stair. Enjoys playing golf, when he went to the beach, is challenging for him to go upstairs. His wife is concerned.  He underwent echocardiogram on 03/31/12 that showed an ejection fraction of 50% with base to mid inferior hypokinesis. He also underwent a nuclear stress test at that time which showed overall low risk with no evidence of significant ischemia. His prior cardiac catheterization was in June of 2011. Stent was placed to the right coronary artery at that time. This was in the setting of non-ST elevation myocardial infarction.  He has lost approximately 6 pounds.    Wt Readings from Last 3 Encounters:  04/27/16 221 lb 6.4 oz (100.4 kg)  03/13/16 221 lb 1.6 oz (100.3 kg)  08/26/15 227 lb (103 kg)     Past Medical History:  Diagnosis Date  . CAD (coronary artery disease) 2011  . Cardiac arrhythmia   . COPD (chronic obstructive pulmonary disease) ([place])   . GERD (gastroesophageal reflux disease)   . Hyperlipidemia   . Hypertension   . Myocardial infarction [facility]) 2011    Past Surgical History:  Procedure Laterality Date  . APPENDECTOMY    . CORONARY ANGIOPLASTY WITH STENT PLACEMENT    . EYE SURGERY     lasix /BIL    Current Outpatient Prescriptions  Medication Sig Dispense Refill  . aspirin 81 MG tablet Take 81 mg by mouth daily.    Marland Kitchen atorvastatin  (LIPITOR) 40 MG tablet TAKE 1 TABLET (40 MG TOTAL) BY MOUTH DAILY AT 6 PM. 90 tablet 1  . benazepril-hydrochlorthiazide (LOTENSIN HCT) 20-25 MG tablet TAKE TAKE ONE TABLET BY MOUTH EVERY DAY 90 tablet 2  . cetirizine (ZYRTEC) 10 MG tablet Take 10 mg by mouth daily.    Marland Kitchen levothyroxine (SYNTHROID, LEVOTHROID) 50 MCG tablet TAKE 1 TABLET (50 MCG TOTAL) BY MOUTH DAILY BEFORE BREAKFAST. 90 tablet 2  . metoprolol tartrate (LOPRESSOR) 25 MG tablet Take 1 tablet (25 mg total) by mouth 2 (two) times daily. 180 tablet 1  . Multiple Vitamin (MULTIVITAMIN) tablet Take 1 tablet by mouth daily.    Marland Kitchen omeprazole (PRILOSEC OTC) 20 MG tablet Take 20 mg by mouth daily.     No current facility-administered medications for this visit.     Allergies:   No Known Allergies  Social History:  The patient  reports that he quit smoking about 22 years ago. His smoking use included Cigarettes. He has a 112.00 pack-year smoking history. He has never used smokeless tobacco. He reports that he drinks about 2.5 oz of alcohol per week . He reports that he does not use drugs.   Family History  Problem Relation Age of Onset  . Stroke Mother   . Stroke Father   . Multiple sclerosis Sister     ROS:  Please see the history of present illness.   Denies any fevers, chills, orthopnea,  PND, syncope   All other systems reviewed and negative.   PHYSICAL EXAM: VS:  BP 118/70   Pulse (!) 54   Ht 5' 10.5" (1.791 m)   Wt 221 lb 6.4 oz (100.4 kg)   BMI 31.32 kg/m  Well nourished, well developed, in no acute distress HEENT: normal, [place]/AT, EOMI Neck: no JVD, normal carotid upstroke, no bruit Cardiac:  normal S1, S2; RRR; no murmur Lungs:  clear to auscultation bilaterally, no wheezing, rhonchi or rales Abd: soft, nontender, no hepatomegaly, no bruits Ext: no edema, 2+ distal pulses Skin: warm and dry GU: deferred Neuro: no focal abnormalities noted, AAO x 3  EKG: Today: 04/27/16-sinus bradycardia rate 54 no other abnormalities.  04/12/15-size bradycardia rate 57 with no other abnormalities personally viewed- 03/07/14-sinus bradycardia rate 59 with PVCs, 2 seen on EKG    LABS: LDL 59 on 06/09/13  ASSESSMENT AND PLAN:  1. Unstable anginal symptoms/coronary artery disease with angina-concerning that his shortness of breath seems to be getting worse with exertional activity. He does have a history of right coronary artery stent placement. Previous stress test in 2013 was low risk however given his escalation in symptoms and his known history of CAD, we will proceed with diagnostic cardiac catheterization for further evaluation. Risks and benefits of the procedure including stroke, heart attack, death, renal impairment have been discussed. He is willing to proceed.  Secondary prevention. 2. Hypertension-well-controlled. 3. Hyperlipidemia-atorvastatin 40. LDL goal less than 70. 4. PVC's-Asymptomatic. 5. OSA - he does feel decreased shortness of breath after wearing mask. This is being managed by pulmonary.  6. COPD-many years of smoking. Pulmonary. If no significant evidence of coronary artery disease present, it is likely that his COPD is causing most of his shortness of breath. 7. Post cath followup  Signed, Candee Furbish, MD [facility]  04/27/2016 10:35 AM

## 2016-04-27 NOTE — Addendum Note (Signed)
Addended by: Candee Furbish C on: 04/27/2016 11:14 AM   Modules accepted: Orders, SmartSet

## 2016-04-27 NOTE — Patient Instructions (Signed)
Medication Instructions:  The current medical regimen is effective;  continue present plan and medications.  Labwork: Please have blood work today. (CBC, BMP and PT/INR)  Testing/Procedures: Your physician has requested that you have a cardiac catheterization. Cardiac catheterization is used to diagnose and/or treat various heart conditions. Doctors may recommend this procedure for a number of different reasons. The most common reason is to evaluate chest pain. Chest pain can be a symptom of coronary artery disease (CAD), and cardiac catheterization can show whether plaque is narrowing or blocking your heart's arteries. This procedure is also used to evaluate the valves, as well as measure the blood flow and oxygen levels in different parts of your heart. For further information please visit [email or web address]. Please follow instruction sheet, as given.  Follow-Up: Follow up with Dr Marlou Porch or APP approximately 2 weeks after your cardiac cath.  If you need a refill on your cardiac medications before your next appointment, please call your pharmacy.  Thank you for choosing [facility]!!

## 2016-05-05 ENCOUNTER — Encounter (HOSPITAL_COMMUNITY): Payer: Self-pay | Admitting: Cardiology

## 2016-05-05 ENCOUNTER — Ambulatory Visit (HOSPITAL_COMMUNITY)
Admission: RE | Admit: 2016-05-05 | Discharge: 2016-05-05 | Disposition: A | Discharge status: Home / Self Care | Payer: Medicare HMO | Source: Ambulatory Visit | Attending: Cardiology | Admitting: Cardiology

## 2016-05-05 ENCOUNTER — Encounter (HOSPITAL_COMMUNITY)
Admission: RE | Disposition: A | Discharge status: Home / Self Care | Payer: Self-pay | Source: Ambulatory Visit | Attending: Cardiology

## 2016-05-05 DIAGNOSIS — Z823 Family history of stroke: Secondary | ICD-10-CM | POA: Insufficient documentation

## 2016-05-05 DIAGNOSIS — E785 Hyperlipidemia, unspecified: Secondary | ICD-10-CM | POA: Insufficient documentation

## 2016-05-05 DIAGNOSIS — Z955 Presence of coronary angioplasty implant and graft: Secondary | ICD-10-CM | POA: Diagnosis not present

## 2016-05-05 DIAGNOSIS — I200 Unstable angina: Secondary | ICD-10-CM

## 2016-05-05 DIAGNOSIS — J449 Chronic obstructive pulmonary disease, unspecified: Secondary | ICD-10-CM | POA: Diagnosis not present

## 2016-05-05 DIAGNOSIS — K219 Gastro-esophageal reflux disease without esophagitis: Secondary | ICD-10-CM | POA: Insufficient documentation

## 2016-05-05 DIAGNOSIS — G4733 Obstructive sleep apnea (adult) (pediatric): Secondary | ICD-10-CM | POA: Diagnosis not present

## 2016-05-05 DIAGNOSIS — I10 Essential (primary) hypertension: Secondary | ICD-10-CM | POA: Insufficient documentation

## 2016-05-05 DIAGNOSIS — I2510 Atherosclerotic heart disease of native coronary artery without angina pectoris: Secondary | ICD-10-CM | POA: Diagnosis present

## 2016-05-05 DIAGNOSIS — E669 Obesity, unspecified: Secondary | ICD-10-CM | POA: Insufficient documentation

## 2016-05-05 DIAGNOSIS — I25110 Atherosclerotic heart disease of native coronary artery with unstable angina pectoris: Secondary | ICD-10-CM | POA: Insufficient documentation

## 2016-05-05 DIAGNOSIS — Z87891 Personal history of nicotine dependence: Secondary | ICD-10-CM | POA: Diagnosis not present

## 2016-05-05 DIAGNOSIS — I252 Old myocardial infarction: Secondary | ICD-10-CM | POA: Insufficient documentation

## 2016-05-05 DIAGNOSIS — Z6831 Body mass index (BMI) 31.0-31.9, adult: Secondary | ICD-10-CM | POA: Insufficient documentation

## 2016-05-05 DIAGNOSIS — Z7982 Long term (current) use of aspirin: Secondary | ICD-10-CM | POA: Insufficient documentation

## 2016-05-05 DIAGNOSIS — I493 Ventricular premature depolarization: Secondary | ICD-10-CM | POA: Diagnosis not present

## 2016-05-05 HISTORY — PX: CARDIAC CATHETERIZATION: SHX172

## 2016-05-05 SURGERY — LEFT HEART CATH AND CORONARY ANGIOGRAPHY

## 2016-05-05 MED ORDER — MIDAZOLAM HCL 2 MG/2ML IJ SOLN
INTRAMUSCULAR | Status: DC | PRN
  Administered 2016-05-05: 2 mg via INTRAVENOUS

## 2016-05-05 MED ORDER — IOPAMIDOL (ISOVUE-370) INJECTION 76%
INTRAVENOUS | Status: AC
  Filled 2016-05-05: qty 50

## 2016-05-05 MED ORDER — SODIUM CHLORIDE 0.9 % IV SOLN: 250.0000 mL | INTRAVENOUS | Status: DC | PRN

## 2016-05-05 MED ORDER — SODIUM CHLORIDE 0.9% FLUSH: 3.0000 mL | Freq: Two times a day (BID) | INTRAVENOUS | Status: DC

## 2016-05-05 MED ORDER — LIDOCAINE HCL (PF) 1 % IJ SOLN
INTRAMUSCULAR | Status: AC
  Filled 2016-05-05: qty 30

## 2016-05-05 MED ORDER — FENTANYL CITRATE (PF) 100 MCG/2ML IJ SOLN
INTRAMUSCULAR | Status: DC | PRN
  Administered 2016-05-05: 25 ug via INTRAVENOUS

## 2016-05-05 MED ORDER — MIDAZOLAM HCL 2 MG/2ML IJ SOLN
INTRAMUSCULAR | Status: AC
  Filled 2016-05-05: qty 2

## 2016-05-05 MED ORDER — IOPAMIDOL (ISOVUE-370) INJECTION 76%
INTRAVENOUS | Status: AC
  Filled 2016-05-05: qty 100

## 2016-05-05 MED ORDER — FENTANYL CITRATE (PF) 100 MCG/2ML IJ SOLN
INTRAMUSCULAR | Status: AC
  Filled 2016-05-05: qty 2

## 2016-05-05 MED ORDER — SODIUM CHLORIDE 0.9% FLUSH: 3.0000 mL | INTRAVENOUS | Status: DC | PRN

## 2016-05-05 MED ORDER — VERAPAMIL HCL 2.5 MG/ML IV SOLN
INTRAVENOUS | Status: AC
  Filled 2016-05-05: qty 2

## 2016-05-05 MED ORDER — HEPARIN (PORCINE) IN NACL 2-0.9 UNIT/ML-% IJ SOLN
INTRAMUSCULAR | Status: AC
  Filled 2016-05-05: qty 1500

## 2016-05-05 MED ORDER — IOPAMIDOL (ISOVUE-370) INJECTION 76%
INTRAVENOUS | Status: DC | PRN
  Administered 2016-05-05: 115 mL via INTRAVENOUS

## 2016-05-05 MED ORDER — HEPARIN (PORCINE) IN NACL 2-0.9 UNIT/ML-% IJ SOLN
INTRAMUSCULAR | Status: DC | PRN
  Administered 2016-05-05: 10 mL via INTRA_ARTERIAL

## 2016-05-05 MED ORDER — LIDOCAINE HCL (PF) 1 % IJ SOLN
INTRAMUSCULAR | Status: DC | PRN
  Administered 2016-05-05: 2 mL via INTRADERMAL

## 2016-05-05 MED ORDER — HEPARIN (PORCINE) IN NACL 2-0.9 UNIT/ML-% IJ SOLN
INTRAMUSCULAR | Status: DC | PRN
  Administered 2016-05-05: 1500 mL

## 2016-05-05 MED ORDER — HEPARIN SODIUM (PORCINE) 1000 UNIT/ML IJ SOLN
INTRAMUSCULAR | Status: DC | PRN
  Administered 2016-05-05: 5000 [IU] via INTRAVENOUS

## 2016-05-05 MED ORDER — SODIUM CHLORIDE 0.9 % WEIGHT BASED INFUSION: 3.0000 mL/kg/h | INTRAVENOUS | Status: DC

## 2016-05-05 MED ORDER — HEPARIN SODIUM (PORCINE) 1000 UNIT/ML IJ SOLN
INTRAMUSCULAR | Status: AC
  Filled 2016-05-05: qty 1

## 2016-05-05 MED ORDER — ASPIRIN 81 MG PO CHEW: 81.0000 mg | CHEWABLE_TABLET | ORAL | Status: AC

## 2016-05-05 MED ORDER — SODIUM CHLORIDE 0.9 % WEIGHT BASED INFUSION
3.0000 mL/kg/h | INTRAVENOUS | Status: DC
  Administered 2016-05-05: 3 mL/kg/h via INTRAVENOUS

## 2016-05-05 MED ORDER — SODIUM CHLORIDE 0.9 % WEIGHT BASED INFUSION: 1.0000 mL/kg/h | INTRAVENOUS | Status: DC

## 2016-05-05 MED ORDER — SODIUM CHLORIDE 0.9 % WEIGHT BASED INFUSION
3.0000 mL/kg/h | INTRAVENOUS | Status: AC
  Administered 2016-05-05: 3 mL/kg/h via INTRAVENOUS

## 2016-05-05 SURGICAL SUPPLY — 9 items
CATH IMPULSE 5F ANG/FL3.5 (CATHETERS) ×2 IMPLANT
DEVICE RAD COMP TR BAND LRG (VASCULAR PRODUCTS) ×2 IMPLANT
GLIDESHEATH SLEND SS 6F .021 (SHEATH) ×2 IMPLANT
KIT HEART LEFT (KITS) ×2 IMPLANT
PACK CARDIAC CATHETERIZATION (CUSTOM PROCEDURE TRAY) ×2 IMPLANT
SYR MEDRAD MARK V 150ML (SYRINGE) ×2 IMPLANT
TRANSDUCER W/STOPCOCK (MISCELLANEOUS) ×2 IMPLANT
TUBING CIL FLEX 10 FLL-RA (TUBING) ×2 IMPLANT
WIRE SAFE-T 1.5MM-J .035X260CM (WIRE) ×2 IMPLANT

## 2016-05-05 NOTE — H&P (View-Only) (Signed)
[facility]. [address] [place], [place]  [address] Phone: [phone] Fax:  [phone]  Date:  04/27/2016   ID:  [place], Bober October 30, 1945, MRN [id number]  PCP:  Eulas Post, MD   History of Present Illness: DEONTRE DEGIORGIO is a 70 y.o. male with coronary artery disease status post non-ST elevation myocardial infarction with hyperlipidemia, hypertension, obesity here for  Followup.  He has been noticing more increasing/significant shortness of breath with activity concerning for anginal symptoms. It is hard to distinguish whether or not this is secondary to COPD. No chest pain.  Going up stair. Enjoys playing golf, when he went to the beach, is challenging for him to go upstairs. His wife is concerned.  He underwent echocardiogram on 03/31/12 that showed an ejection fraction of 50% with base to mid inferior hypokinesis. He also underwent a nuclear stress test at that time which showed overall low risk with no evidence of significant ischemia. His prior cardiac catheterization was in June of 2011. Stent was placed to the right coronary artery at that time. This was in the setting of non-ST elevation myocardial infarction.  He has lost approximately 6 pounds.    Wt Readings from Last 3 Encounters:  04/27/16 221 lb 6.4 oz (100.4 kg)  03/13/16 221 lb 1.6 oz (100.3 kg)  08/26/15 227 lb (103 kg)     Past Medical History:  Diagnosis Date  . CAD (coronary artery disease) 2011  . Cardiac arrhythmia   . COPD (chronic obstructive pulmonary disease) ([place])   . GERD (gastroesophageal reflux disease)   . Hyperlipidemia   . Hypertension   . Myocardial infarction [facility]) 2011    Past Surgical History:  Procedure Laterality Date  . APPENDECTOMY    . CORONARY ANGIOPLASTY WITH STENT PLACEMENT    . EYE SURGERY     lasix /BIL    Current Outpatient Prescriptions  Medication Sig Dispense Refill  . aspirin 81 MG tablet Take 81 mg by mouth daily.    Marland Kitchen atorvastatin  (LIPITOR) 40 MG tablet TAKE 1 TABLET (40 MG TOTAL) BY MOUTH DAILY AT 6 PM. 90 tablet 1  . benazepril-hydrochlorthiazide (LOTENSIN HCT) 20-25 MG tablet TAKE TAKE ONE TABLET BY MOUTH EVERY DAY 90 tablet 2  . cetirizine (ZYRTEC) 10 MG tablet Take 10 mg by mouth daily.    Marland Kitchen levothyroxine (SYNTHROID, LEVOTHROID) 50 MCG tablet TAKE 1 TABLET (50 MCG TOTAL) BY MOUTH DAILY BEFORE BREAKFAST. 90 tablet 2  . metoprolol tartrate (LOPRESSOR) 25 MG tablet Take 1 tablet (25 mg total) by mouth 2 (two) times daily. 180 tablet 1  . Multiple Vitamin (MULTIVITAMIN) tablet Take 1 tablet by mouth daily.    Marland Kitchen omeprazole (PRILOSEC OTC) 20 MG tablet Take 20 mg by mouth daily.     No current facility-administered medications for this visit.     Allergies:   No Known Allergies  Social History:  The patient  reports that he quit smoking about 22 years ago. His smoking use included Cigarettes. He has a 112.00 pack-year smoking history. He has never used smokeless tobacco. He reports that he drinks about 2.5 oz of alcohol per week . He reports that he does not use drugs.   Family History  Problem Relation Age of Onset  . Stroke Mother   . Stroke Father   . Multiple sclerosis Sister     ROS:  Please see the history of present illness.   Denies any fevers, chills, orthopnea,  PND, syncope   All other systems reviewed and negative.   PHYSICAL EXAM: VS:  BP 118/70   Pulse (!) 54   Ht 5' 10.5" (1.791 m)   Wt 221 lb 6.4 oz (100.4 kg)   BMI 31.32 kg/m  Well nourished, well developed, in no acute distress HEENT: normal, /AT, EOMI Neck: no JVD, normal carotid upstroke, no bruit Cardiac:  normal S1, S2; RRR; no murmur Lungs:  clear to auscultation bilaterally, no wheezing, rhonchi or rales Abd: soft, nontender, no hepatomegaly, no bruits Ext: no edema, 2+ distal pulses Skin: warm and dry GU: deferred Neuro: no focal abnormalities noted, AAO x 3  EKG: Today: 04/27/16-sinus bradycardia rate 54 no other abnormalities.  04/12/15-size bradycardia rate 57 with no other abnormalities personally viewed- 03/07/14-sinus bradycardia rate 59 with PVCs, 2 seen on EKG    LABS: LDL 59 on 06/09/13  ASSESSMENT AND PLAN:  1. Unstable anginal symptoms/coronary artery disease with angina-concerning that his shortness of breath seems to be getting worse with exertional activity. He does have a history of right coronary artery stent placement. Previous stress test in 2013 was low risk however given his escalation in symptoms and his known history of CAD, we will proceed with diagnostic cardiac catheterization for further evaluation. Risks and benefits of the procedure including stroke, heart attack, death, renal impairment have been discussed. He is willing to proceed.  Secondary prevention. 2. Hypertension-well-controlled. 3. Hyperlipidemia-atorvastatin 40. LDL goal less than 70. 4. PVC's-Asymptomatic. 5. OSA - he does feel decreased shortness of breath after wearing mask. This is being managed by pulmonary.  6. COPD-many years of smoking. Pulmonary. If no significant evidence of coronary artery disease present, it is likely that his COPD is causing most of his shortness of breath. 7. Post cath followup  Signed, Candee Furbish, MD [facility]  04/27/2016 10:35 AM

## 2016-05-05 NOTE — Interval H&P Note (Signed)
History and Physical Interval Note:  05/05/2016 7:40 AM  [name]  has presented today for surgery, with the diagnosis of unstable angina  The various methods of treatment have been discussed with the patient and family. After consideration of risks, benefits and other options for treatment, the patient has consented to  Procedure(s): Left Heart Cath and Coronary Angiography (N/A) as a surgical intervention .  The patient's history has been reviewed, patient examined, no change in status, stable for surgery.  I have reviewed the patient's chart and labs.  Questions were answered to the patient's satisfaction.     [employer]

## 2016-05-05 NOTE — Discharge Instructions (Signed)
Radial Site Care °Refer to this sheet in the next few weeks. These instructions provide you with information about caring for yourself after your procedure. Your health care provider may also give you more specific instructions. Your treatment has been planned according to current medical practices, but problems sometimes occur. Call your health care provider if you have any problems or questions after your procedure. °WHAT TO EXPECT AFTER THE PROCEDURE °After your procedure, it is typical to have the following: °· Bruising at the radial site that usually fades within 1-2 weeks. °· Blood collecting in the tissue (hematoma) that may be painful to the touch. It should usually decrease in size and tenderness within 1-2 weeks. °HOME CARE INSTRUCTIONS °· Take medicines only as directed by your health care provider. °· You may shower 24-48 hours after the procedure or as directed by your health care provider. Remove the bandage (dressing) and gently wash the site with plain soap and water. Pat the area dry with a clean towel. Do not rub the site, because this may cause bleeding. °· Do not take baths, swim, or use a hot tub until your health care provider approves. °· Check your insertion site every day for redness, swelling, or drainage. °· Do not apply powder or lotion to the site. °· Do not flex or bend the affected arm for 24 hours or as directed by your health care provider. °· Do not push or pull heavy objects with the affected arm for 24 hours or as directed by your health care provider. °· Do not lift over 10 lb (4.5 kg) for 5 days after your procedure or as directed by your health care provider. °· Ask your health care provider when it is okay to: °¨ Return to work or school. °¨ Resume usual physical activities or sports. °¨ Resume sexual activity. °· Do not drive home if you are discharged the same day as the procedure. Have someone else drive you. °· You may drive 24 hours after the procedure unless otherwise  instructed by your health care provider. °· Do not operate machinery or power tools for 24 hours after the procedure. °· If your procedure was done as an outpatient procedure, which means that you went home the same day as your procedure, a responsible adult should be with you for the first 24 hours after you arrive home. °· Keep all follow-up visits as directed by your health care provider. This is important. °SEEK MEDICAL CARE IF: °· You have a fever. °· You have chills. °· You have increased bleeding from the radial site. Hold pressure on the site. °SEEK IMMEDIATE MEDICAL CARE IF: °· You have unusual pain at the radial site. °· You have redness, warmth, or swelling at the radial site. °· You have drainage (other than a small amount of blood on the dressing) from the radial site. °· The radial site is bleeding, and the bleeding does not stop after 30 minutes of holding steady pressure on the site. °· Your arm or hand becomes pale, cool, tingly, or numb. °  °This information is not intended to replace advice given to you by your health care provider. Make sure you discuss any questions you have with your health care provider. °  °Document Released: 08/22/2010 Document Revised: 08/10/2014 Document Reviewed: 02/05/2014 °Elsevier Interactive Patient Education ©2016 [employer] ° °

## 2016-05-14 ENCOUNTER — Encounter: Payer: Self-pay | Admitting: *Deleted

## 2016-05-27 ENCOUNTER — Encounter: Payer: Self-pay | Admitting: Cardiology

## 2016-05-27 ENCOUNTER — Ambulatory Visit (INDEPENDENT_AMBULATORY_CARE_PROVIDER_SITE_OTHER): Payer: Medicare HMO | Admitting: Cardiology

## 2016-05-27 VITALS — BP 116/68 | HR 52 | Ht 70.0 in | Wt 222.8 lb

## 2016-05-27 DIAGNOSIS — I10 Essential (primary) hypertension: Secondary | ICD-10-CM

## 2016-05-27 DIAGNOSIS — I2510 Atherosclerotic heart disease of native coronary artery without angina pectoris: Secondary | ICD-10-CM | POA: Diagnosis not present

## 2016-05-27 DIAGNOSIS — E7800 Pure hypercholesterolemia, unspecified: Secondary | ICD-10-CM

## 2016-05-27 DIAGNOSIS — I2583 Coronary atherosclerosis due to lipid rich plaque: Secondary | ICD-10-CM | POA: Diagnosis not present

## 2016-05-27 NOTE — Patient Instructions (Signed)
Your physician has recommended you make the following change in your medication:  1.) change metoprolol to 12.5 mg two times a day FOR 3 DAYS then STOP  Your physician wants you to follow-up in: 1 year with Dr. Marlou Porch.  You will receive a reminder letter in the mail two months in advance. If you don't receive a letter, please call our office to schedule the follow-up appointment.  Please contact your pharmacy for any refill needs!

## 2016-05-27 NOTE — Progress Notes (Signed)
[facility] [address], [address] [place], [place]  [address] Phone: [phone] Fax:  [phone]  Date:  05/27/2016   ID:  [name], Kyle 1946-02-24, MRN [id number]  PCP:  Eulas Post, MD   History of Present Illness: Kyle [name] is a 70 y.o. male with coronary artery disease status post non-ST elevation myocardial infarction with hyperlipidemia, hypertension, obesity here for  Followup.  He had been noticing more increasing/significant shortness of breath with activity concerning for anginal symptoms. It is hard to distinguish whether or not this is secondary to COPD. No chest pain.Going up stair. Enjoys playing golf, when he went to the beach, is challenging for him to go upstairs. His wife was concerned. Therefore, he underwent cardiac catheterization on 05/05/16 which was reassuring, patent stent.    He underwent echocardiogram on 03/31/12 that showed an ejection fraction of 50% with base to mid inferior hypokinesis. He also underwent a nuclear stress test at that time which showed overall low risk with no evidence of significant ischemia. His prior cardiac catheterization was in June of 2011. Stent was placed to the right coronary artery at that time. This was in the setting of non-ST elevation myocardial infarction.  Dr. Annamaria Boots is been following him for his obstructive sleep apnea.    Wt Readings from Last 3 Encounters:  05/27/16 222 lb 12.8 oz (101.1 kg)  05/05/16 222 lb (100.7 kg)  04/27/16 221 lb 6.4 oz (100.4 kg)     Past Medical History:  Diagnosis Date  . CAD (coronary artery disease) 2011  . Cardiac arrhythmia   . COPD (chronic obstructive pulmonary disease) ([place])   . GERD (gastroesophageal reflux disease)   . Hyperlipidemia   . Hypertension   . Myocardial infarction 2011    Past Surgical History:  Procedure Laterality Date  . APPENDECTOMY    . CARDIAC CATHETERIZATION N/A 05/05/2016   Procedure: Left Heart Cath and Coronary Angiography;   Surgeon: Jerline Pain, MD;  Location: [facility];  Service: Cardiovascular;  Laterality: N/A;  . CORONARY ANGIOPLASTY WITH STENT PLACEMENT    . EYE SURGERY     lasix /BIL    Current Outpatient Prescriptions  Medication Sig Dispense Refill  . aspirin 81 MG tablet Take 81 mg by mouth daily.    Marland Kitchen atorvastatin (LIPITOR) 40 MG tablet TAKE 1 TABLET (40 MG TOTAL) BY MOUTH DAILY AT 6 PM. 90 tablet 1  . benazepril-hydrochlorthiazide (LOTENSIN HCT) 20-25 MG tablet TAKE TAKE ONE TABLET BY MOUTH EVERY DAY 90 tablet 2  . cetirizine (ZYRTEC) 10 MG tablet Take 10 mg by mouth daily.    . Cyanocobalamin (B-12 PO) Take 1 tablet by mouth daily.    Marland Kitchen levothyroxine (SYNTHROID, LEVOTHROID) 50 MCG tablet TAKE 1 TABLET (50 MCG TOTAL) BY MOUTH DAILY BEFORE BREAKFAST. 90 tablet 2  . Multiple Vitamin (MULTIVITAMIN) tablet Take 1 tablet by mouth daily.    Marland Kitchen omeprazole (PRILOSEC OTC) 20 MG tablet Take 20 mg by mouth daily as needed (heartburn).      No current facility-administered medications for this visit.     Allergies:   No Known Allergies  Social History:  The patient  reports that he quit smoking about 22 years ago. His smoking use included Cigarettes. He has a 112.00 pack-year smoking history. He has never used smokeless tobacco. He reports that he drinks about 2.5 oz of alcohol per week . He reports that he does not use drugs.  Family History  Problem Relation Age of Onset  . Stroke Mother   . Stroke Father   . Multiple sclerosis Sister     ROS:  Please see the history of present illness.   Denies any fevers, chills, orthopnea, PND, syncope   All other systems reviewed and negative.   PHYSICAL EXAM: VS:  BP 116/68   Pulse (!) 52   Ht 5\' 10"  (1.778 m)   Wt 222 lb 12.8 oz (101.1 kg)   BMI 31.97 kg/m  Well nourished, well developed, in no acute distress HEENT: normal, [place]/AT, EOMI Neck: no JVD, normal carotid upstroke, no bruit Cardiac:  normal S1, S2; mildly bradycardic, regular no  murmur Lungs:  clear to auscultation bilaterally, no wheezing, rhonchi or rales Abd: soft, nontender, no hepatomegaly, no bruits Ext: no edema, 2+ distal pulses Skin: warm and dry GU: deferred Neuro: no focal abnormalities noted, AAO x 3  EKG: Today: 04/27/16-sinus bradycardia rate 54 no other abnormalities. 04/12/15-size bradycardia rate 57 with no other abnormalities personally viewed- 03/07/14-sinus bradycardia rate 59 with PVCs, 2 seen on EKG    LABS: LDL 59 on 06/09/13  Cath 05/05/16:  The left ventricular systolic function is normal.  LV end diastolic pressure is normal.  There is no aortic valve stenosis.  There is no mitral valve regurgitation.  Previously placed right coronary artery mid stent widely patent.  30-40% distal RCA stenosis  Minor luminal irregularities in left system.   Reassuring. Nonobstructive coronary artery disease. Patent right coronary stent. Continue with medical management. Candee Furbish, MD  ASSESSMENT AND PLAN:  1. CAD - stable cath, RCA stent patent 05/05/16 Secondary prevention. He is somewhat bradycardic on today's visit and still feeling decreased energy overall. I will taper off his low-dose metoprolol 25 mg twice a day and see how he does. Hopefully this given her more energy. Encouraged him to use Silver sneakers, exercise. This will be good for both his heart and his lungs. 2. Hypertension-well-controlled. 3. Hyperlipidemia-atorvastatin 40. LDL goal less than 70. 4. PVC's-Asymptomatic. Hopefully this will not become more of an issue after stopping his beta blocker. 5. OSA - he does feel decreased shortness of breath after wearing mask. This is being managed by pulmonary. Dr. Annamaria Boots 6. COPD-many years of smoking. Pulmonary, Dr. Annamaria Boots. If no significant evidence of coronary artery disease present, it is likely that his COPD is causing most of his shortness of breath.  One year followup  Signed, Candee Furbish, MD [facility]  05/27/2016 9:27 AM

## 2016-06-09 ENCOUNTER — Telehealth: Payer: Self-pay | Admitting: Family Medicine

## 2016-06-09 NOTE — Telephone Encounter (Signed)
Patients' wife states he can come in MAYBE next year  Shay 06-09-16

## 2016-06-13 DIAGNOSIS — G4733 Obstructive sleep apnea (adult) (pediatric): Secondary | ICD-10-CM | POA: Diagnosis not present

## 2016-06-18 ENCOUNTER — Other Ambulatory Visit: Payer: Self-pay | Admitting: Family Medicine

## 2016-07-31 ENCOUNTER — Other Ambulatory Visit: Payer: Self-pay | Admitting: Cardiology

## 2016-08-11 DIAGNOSIS — G4733 Obstructive sleep apnea (adult) (pediatric): Secondary | ICD-10-CM | POA: Diagnosis not present

## 2016-08-12 DIAGNOSIS — G4733 Obstructive sleep apnea (adult) (pediatric): Secondary | ICD-10-CM | POA: Diagnosis not present

## 2016-08-14 ENCOUNTER — Other Ambulatory Visit: Payer: Self-pay | Admitting: Family Medicine

## 2016-08-24 DIAGNOSIS — R69 Illness, unspecified: Secondary | ICD-10-CM | POA: Diagnosis not present

## 2016-08-25 ENCOUNTER — Ambulatory Visit: Payer: Medicare HMO | Admitting: Internal Medicine

## 2016-09-09 ENCOUNTER — Encounter: Payer: Self-pay | Admitting: Internal Medicine

## 2016-09-09 DIAGNOSIS — Z9889 Other specified postprocedural states: Secondary | ICD-10-CM | POA: Insufficient documentation

## 2016-09-09 DIAGNOSIS — Z961 Presence of intraocular lens: Secondary | ICD-10-CM | POA: Diagnosis not present

## 2016-09-09 DIAGNOSIS — H18452 Nodular corneal degeneration, left eye: Secondary | ICD-10-CM | POA: Diagnosis not present

## 2016-09-09 DIAGNOSIS — B0233 Zoster keratitis: Secondary | ICD-10-CM | POA: Diagnosis not present

## 2016-09-09 DIAGNOSIS — H1712 Central corneal opacity, left eye: Secondary | ICD-10-CM | POA: Diagnosis not present

## 2016-09-10 ENCOUNTER — Encounter: Payer: Self-pay | Admitting: Internal Medicine

## 2016-09-10 ENCOUNTER — Ambulatory Visit (INDEPENDENT_AMBULATORY_CARE_PROVIDER_SITE_OTHER): Payer: Medicare HMO | Admitting: Internal Medicine

## 2016-09-10 VITALS — BP 136/74 | HR 78 | Ht 71.0 in | Wt 232.8 lb

## 2016-09-10 DIAGNOSIS — J449 Chronic obstructive pulmonary disease, unspecified: Secondary | ICD-10-CM | POA: Diagnosis not present

## 2016-09-10 DIAGNOSIS — E669 Obesity, unspecified: Secondary | ICD-10-CM

## 2016-09-10 DIAGNOSIS — G4733 Obstructive sleep apnea (adult) (pediatric): Secondary | ICD-10-CM | POA: Diagnosis not present

## 2016-09-10 NOTE — Patient Instructions (Addendum)
We can continue CPAP auto 5-12/ [facility], mask of choice, humidifier, supplies, AirView    Dx OSA  Order- office spirometry    Dx COPD mixed type  Please call as needed  Order- schedule PFT- dx COPD mixed type  Sample Bevespi imhaler      Inhale 2 puffs, two times daily    Note if this seems to help shortness of breath

## 2016-09-10 NOTE — Progress Notes (Signed)
Subjective:    Patient ID: Kyle [name], male    DOB: 06-21-1946, 71 y.o.   MRN: [id number]  HPI   male former smoker followed for OSA, COPD, Rhinitis Complicated by GERD, CAD/MI, HBP Unattended Home Sleep Test-01/05/2014, moderate OSA, AHI 19/hour, weight 223 pounds Office Spirometry 09/10/2016-severe obstructive airways disease: FVC 2.83/61%, FEV1 1.17/34%, ratio 0.41, FEF 25-75% 0.35/13% -----------------------------------------------------------------   08/26/15-71 year old male former smoker followed for OSA, COPD CPAP auto 5-12/ [facility] FOLLOWS FOR: pt wearing cpap nightly, tolerating cpap well.  DME: [facility]. cpap download printed.  Download confirms excellent compliance and adequate control. He is comfortable with pressure and feels quality of life is better with CPAP.  09/10/2016-71 year old male former smoker followed for OSA, COPD, rhinitis complicated by GERD, CAD/MI, HBP CPAP auto 5-12/[facility] FOLLOWS FOR: yearly sleep follow up.  reports wearing CPAP every night x7 hrs.  denies any mask, pressure or fit issues today His download continues to show good compliance and control at current settings and he doesn't want to sleep without it. Quality of life improved. Has been getting his supplies on line. More aware of dyspnea on exertion and shortness of breath bending over. No routine cough. Before quitting he had gotten up to 4 packs of cigarettes per day many years ago. Office Spirometry 09/10/2016-severe obstructive airways disease: FVC 2.83/61%, FEV1 1.17/34%, ratio 0.41, FEF 25-75% 0.35/13%   ROS-see HPI   Negative unless "+" Constitutional:    weight loss, night sweats, fevers, chills, fatigue, lassitude. HEENT:    headaches, difficulty swallowing, tooth/dental problems, sore throat,       sneezing, itching, ear ache, nasal congestion, post nasal drip, snoring CV:    chest pain, orthopnea, PND, swelling in lower extremities, anasarca,                                                   dizziness, palpitations Resp:   +shortness of breath with exertion or at rest.                productive cough,   non-productive cough, coughing up of blood.              change in color of mucus.  wheezing.   Skin:    rash or lesions. GI:  No-   heartburn, indigestion, abdominal pain, nausea, vomiting, diarrhea,                 change in bowel habits, loss of appetite GU: dysuria, change in color of urine, no urgency or frequency.   flank pain. MS:   joint pain, stiffness, decreased range of motion, back pain. Neuro-     nothing unusual Psych:  change in mood or affect.  depression or anxiety.   memory loss.     Objective:  OBJ- Physical Exam General- Alert, Oriented, Affect-appropriate, Distress- none acute, + Obese Skin- rash-none, lesions- none, excoriation- none Lymphadenopathy- none Head- atraumatic            Eyes- Gross vision intact, PERRLA, conjunctivae and secretions clear            Ears- Hearing, canals-normal            Nose- Clear, no-Septal dev, mucus, polyps, erosion, perforation, + stuffy             Throat- Mallampati III , mucosa clear , drainage-  none, tonsils- atrophic Neck- flexible , trachea midline, no stridor , thyroid nl, carotid no bruit Chest - symmetrical excursion , unlabored           Heart/CV- RRR , no murmur , no gallop  , no rub, nl s1 s2                           - JVD- none , edema- none, stasis changes- none, varices- none           Lung- + unlabored, +few crackles in bases, wheeze- none, cough- none , dullness-none, rub- none           Chest wall-  Abd-  Br/ Gen/ Rectal- Not done, not indicated Extrem- cyanosis- none, clubbing, none, atrophy- none, strength- nl Neuro- grossly intact to observation  Assessment & Plan:

## 2016-09-15 ENCOUNTER — Telehealth: Payer: Self-pay | Admitting: Internal Medicine

## 2016-09-15 DIAGNOSIS — J449 Chronic obstructive pulmonary disease, unspecified: Secondary | ICD-10-CM

## 2016-09-15 MED ORDER — GLYCOPYRROLATE-FORMOTEROL 9-4.8 MCG/ACT IN AERO: 2.0000 | INHALATION_SPRAY | Freq: Two times a day (BID) | RESPIRATORY_TRACT | 0 refills | Status: DC

## 2016-09-15 NOTE — Telephone Encounter (Signed)
Per CY >> please contact pt, his office spirometry showed more severe COPD than expected.   1. Schedule full PFT. 2. Have him start Bevespi 2 puffs BID. Samples are to be given. 2. Make him an OV with me in 3 months.  Called and spoke with the pt. He is aware of the above information. PFT has been scheduled for 09/22/16 at 9am. Samples have been left up front of Bevespi. OV with CY has been scheduled for 12/08/16 at 9:30am. Nothing further was needed.

## 2016-09-15 NOTE — Assessment & Plan Note (Signed)
His COPD is more severe than appreciated on review with this dictation Plan-I will ask staff to contact him to schedule formal PFT and let him try a LABA/LAMA- Bevespi or Anor as sample available

## 2016-09-15 NOTE — Assessment & Plan Note (Signed)
Value of weight loss expected to improve management of OSA as discussed. Encouragement given.

## 2016-09-15 NOTE — Assessment & Plan Note (Signed)
Good compliance and control documented by download and supported by his report of improved quality of life without snoring or significant daytime sleepiness. Plan-continue AutoPap 5-12 using DME [facility] but getting supplies on line because of cost

## 2016-09-22 ENCOUNTER — Encounter (INDEPENDENT_AMBULATORY_CARE_PROVIDER_SITE_OTHER): Payer: Medicare HMO | Admitting: Internal Medicine

## 2016-09-22 DIAGNOSIS — J449 Chronic obstructive pulmonary disease, unspecified: Secondary | ICD-10-CM

## 2016-09-22 LAB — PULMONARY FUNCTION TEST
DL/VA % pred: 68 %
DL/VA: 3.19 ml/min/mmHg/L
DLCO COR % PRED: 61 %
DLCO UNC: 19.55 ml/min/mmHg
DLCO cor: 21.21 ml/min/mmHg
DLCO unc % pred: 56 %
FEF 25-75 POST: 0.62 L/s
FEF 25-75 Pre: 0.74 L/sec
FEF2575-%Change-Post: -16 %
FEF2575-%Pred-Post: 23 %
FEF2575-%Pred-Pre: 27 %
FEV1-%CHANGE-POST: 2 %
FEV1-%PRED-POST: 49 %
FEV1-%Pred-Pre: 48 %
FEV1-POST: 1.72 L
FEV1-PRE: 1.68 L
FEV1FVC-%Change-Post: 3 %
FEV1FVC-%Pred-Pre: 63 %
FEV6-%Change-Post: -2 %
FEV6-%PRED-PRE: 76 %
FEV6-%Pred-Post: 74 %
FEV6-POST: 3.31 L
FEV6-PRE: 3.39 L
FEV6FVC-%CHANGE-POST: 0 %
FEV6FVC-%PRED-POST: 98 %
FEV6FVC-%PRED-PRE: 99 %
FVC-%CHANGE-POST: -1 %
FVC-%PRED-POST: 75 %
FVC-%PRED-PRE: 76 %
FVC-POST: 3.54 L
FVC-Pre: 3.59 L
PRE FEV6/FVC RATIO: 94 %
Post FEV1/FVC ratio: 49 %
Post FEV6/FVC ratio: 94 %
Pre FEV1/FVC ratio: 47 %
RV % PRED: 180 %
RV: 4.57 L
TLC % pred: 123 %
TLC: 9.09 L

## 2016-09-25 ENCOUNTER — Telehealth: Payer: Self-pay | Admitting: Internal Medicine

## 2016-09-25 MED ORDER — GLYCOPYRROLATE-FORMOTEROL 9-4.8 MCG/ACT IN AERO: 2.0000 | INHALATION_SPRAY | Freq: Two times a day (BID) | RESPIRATORY_TRACT | 12 refills | Status: DC

## 2016-09-25 NOTE — Telephone Encounter (Signed)
Spoke with pt and wife, aware of recs.  rx for bevespi sent to pharmacy.  Nothing further needed.

## 2016-09-25 NOTE — Telephone Encounter (Signed)
Patient wife returning call - she can be reached at [phone] -pr

## 2016-09-25 NOTE — Telephone Encounter (Signed)
Pts wife is calling for the results of the PFT that was done this week.  They also wanted to know if the pt will need to continue with the samples that were given at the appt.?  CY please advise thanks  No Known Allergies

## 2016-09-25 NOTE — Telephone Encounter (Signed)
PFT showed severe obstructive airways disease in a COPD pattern.  If the Bevespi inhaler sample is helping breathe easier, the ok to Rx Bevespi. # 1, inhale 2 puffs, twice daily, ref x 12.           Or could use 3 month Rx- # 3, ref x 3

## 2016-09-25 NOTE — Telephone Encounter (Signed)
lmomtcb x1 

## 2016-10-12 DIAGNOSIS — G4733 Obstructive sleep apnea (adult) (pediatric): Secondary | ICD-10-CM | POA: Diagnosis not present

## 2016-10-19 DIAGNOSIS — Z9889 Other specified postprocedural states: Secondary | ICD-10-CM | POA: Diagnosis not present

## 2016-10-19 DIAGNOSIS — B0233 Zoster keratitis: Secondary | ICD-10-CM | POA: Diagnosis not present

## 2016-10-19 DIAGNOSIS — H18452 Nodular corneal degeneration, left eye: Secondary | ICD-10-CM | POA: Diagnosis not present

## 2016-10-19 DIAGNOSIS — Z87891 Personal history of nicotine dependence: Secondary | ICD-10-CM | POA: Diagnosis not present

## 2016-10-19 DIAGNOSIS — Z961 Presence of intraocular lens: Secondary | ICD-10-CM | POA: Diagnosis not present

## 2016-10-19 DIAGNOSIS — H16402 Unspecified corneal neovascularization, left eye: Secondary | ICD-10-CM | POA: Diagnosis not present

## 2016-10-19 DIAGNOSIS — H1712 Central corneal opacity, left eye: Secondary | ICD-10-CM | POA: Diagnosis not present

## 2016-11-09 ENCOUNTER — Other Ambulatory Visit: Payer: Self-pay | Admitting: Family Medicine

## 2016-12-08 ENCOUNTER — Other Ambulatory Visit: Payer: Self-pay | Admitting: Family Medicine

## 2016-12-08 ENCOUNTER — Encounter: Payer: Self-pay | Admitting: Internal Medicine

## 2016-12-08 ENCOUNTER — Ambulatory Visit (INDEPENDENT_AMBULATORY_CARE_PROVIDER_SITE_OTHER): Payer: Medicare HMO | Admitting: Internal Medicine

## 2016-12-08 DIAGNOSIS — E669 Obesity, unspecified: Secondary | ICD-10-CM | POA: Diagnosis not present

## 2016-12-08 DIAGNOSIS — G4733 Obstructive sleep apnea (adult) (pediatric): Secondary | ICD-10-CM

## 2016-12-08 DIAGNOSIS — J449 Chronic obstructive pulmonary disease, unspecified: Secondary | ICD-10-CM

## 2016-12-08 MED ORDER — UMECLIDINIUM-VILANTEROL 62.5-25 MCG/INH IN AEPB: 1.0000 | INHALATION_SPRAY | Freq: Every day | RESPIRATORY_TRACT | 12 refills | Status: DC

## 2016-12-08 NOTE — Assessment & Plan Note (Signed)
We expect weight loss would help his exertional dyspnea as well as his OSA and DM. Discussed.

## 2016-12-08 NOTE — Assessment & Plan Note (Addendum)
Severe COPD-emphysema pattern without significant bronchitic wheezing or coughing currently. He has been using Bevespi inhaler which may offer her more benefit during times of airway inflammation such as winter colds. Plan-referred to pulmonary rehabilitation. He lives in [facility]

## 2016-12-08 NOTE — Progress Notes (Signed)
Subjective:    Patient ID: Kyle [name], male    DOB: 13-May-1946, 71 y.o.   MRN: [id number]  HPI   male former smoker followed for OSA, COPD GOLD III, Rhinitis Complicated by GERD, CAD/MI, HBP Unattended Home Sleep Test-01/05/2014, moderate OSA, AHI 19/hour, weight 223 pounds Office Spirometry 09/10/2016-severe obstructive airways disease: FVC 2.83/61%, FEV1 1.17/34%, ratio 0.41, FEF 25-75% 0.35/13% PFT 09/22/16-severe COPD, no response to dilator, diffusion moderately reduced, air trapping, hyperinflation. FVC 3.54/75%,  FEV1 1.7 2/49%, ratio 0.49, TLC 123%, DLCO 56% -----------------------------------------------------------------  09/10/2016-71 year old male former smoker followed for OSA, COPD GOLD III, rhinitis complicated by GERD, CAD/MI, HBP CPAP auto 5-12/[facility] FOLLOWS FOR: yearly sleep follow up.  reports wearing CPAP every night x7 hrs.  denies any mask, pressure or fit issues today His download continues to show good compliance and control at current settings and he doesn't want to sleep without it. Quality of life improved. Has been getting his supplies on line. More aware of dyspnea on exertion and shortness of breath bending over. No routine cough. Before quitting he had gotten up to 4 packs of cigarettes per day many years ago. Office Spirometry 09/10/2016-severe obstructive airways disease: FVC 2.83/61%, FEV1 1.17/34%, ratio 0.41, FEF 25-75% 0.35/13%  12/08/16- 71 year old male former smoker followed for OSA, COPD, rhinitis complicated by GERD, CAD/MI, HBP CPAP auto 5-12/[facility] FOLLOWS FOR: Pt is not able to go up stairs with weighted items-finds himself getting SOB. Otherwise doing welll. Review PFT with patient. Pt wears CPAP nightly , no chip this visit, not enrolled in AV. DME [facility] Denies cough, wheeze or exertional chest pain. Realizes weight loss would help. PFT reviewed with him. PFT 09/22/16-severe COPD, no response to dilator, diffusion moderately reduced, air  trapping, hyperinflation. FVC 3.54/75%,  FEV1 1.72 /49%, ratio 0.49, TLC 123%, DLCO 56%  ROS-see HPI   Negative unless "+" Constitutional:    weight loss, night sweats, fevers, chills, fatigue, lassitude. HEENT:    headaches, difficulty swallowing, tooth/dental problems, sore throat,       sneezing, itching, ear ache, nasal congestion, post nasal drip, snoring CV:    chest pain, orthopnea, PND, swelling in lower extremities, anasarca,                                                           dizziness, palpitations Resp:   +shortness of breath with exertion or at rest.                productive cough,   non-productive cough, coughing up of blood.              change in color of mucus.  wheezing.   Skin:    rash or lesions. GI:  No-   heartburn, indigestion, abdominal pain, nausea, vomiting, diarrhea,                 change in bowel habits, loss of appetite GU: dysuria, change in color of urine, no urgency or frequency.   flank pain. MS:   joint pain, stiffness, decreased range of motion, back pain. Neuro-     nothing unusual Psych:  change in mood or affect.  depression or anxiety.   memory loss.     Objective:  OBJ- Physical Exam General- Alert, Oriented, Affect-appropriate, Distress- none acute, + Obese Skin-  rash-none, lesions- none, excoriation- none Lymphadenopathy- none Head- atraumatic            Eyes- Gross vision intact, PERRLA, conjunctivae and secretions clear            Ears- Hearing, canals-normal            Nose- Clear, no-Septal dev, mucus, polyps, erosion, perforation,              Throat- Mallampati III , mucosa clear , drainage- none, tonsils- atrophic Neck- flexible , trachea midline, no stridor , thyroid nl, carotid no bruit Chest - symmetrical excursion , unlabored           Heart/CV- RRR , no murmur , no gallop  , no rub, nl s1 s2                           - JVD- none , edema- none, stasis changes- none, varices- none           Lung- + unlabored, Clear,  wheeze- none, cough- none , dullness-none, rub- none           Chest wall-  Abd-  Br/ Gen/ Rectal- Not done, not indicated Extrem- cyanosis- none, clubbing, none, atrophy- none, strength- nl Neuro- grossly intact to observation  Assessment & Plan:

## 2016-12-08 NOTE — Patient Instructions (Addendum)
Continue Bevespi inhaler      Inhale 2 puffs, twice daily   Order- referral to Pulmonary Rehab  [place]  Please call if we can help

## 2016-12-08 NOTE — Assessment & Plan Note (Signed)
He reports good compliance and continues to benefit from CPAP.  Auto pressure range is comfortable 5-12

## 2016-12-11 DIAGNOSIS — G4733 Obstructive sleep apnea (adult) (pediatric): Secondary | ICD-10-CM | POA: Diagnosis not present

## 2016-12-14 ENCOUNTER — Telehealth: Payer: Self-pay

## 2016-12-14 ENCOUNTER — Encounter (HOSPITAL_COMMUNITY): Payer: Self-pay

## 2016-12-14 NOTE — Telephone Encounter (Signed)
Patient is on the list for [employer] 2018 and may be a good candidate for an AWV. Please let me know if/when appt is scheduled.   

## 2016-12-14 NOTE — Progress Notes (Signed)
LMTCB to discuss pulmonary rehab referral.

## 2017-01-14 DIAGNOSIS — G4733 Obstructive sleep apnea (adult) (pediatric): Secondary | ICD-10-CM | POA: Diagnosis not present

## 2017-02-10 DIAGNOSIS — H16402 Unspecified corneal neovascularization, left eye: Secondary | ICD-10-CM | POA: Diagnosis not present

## 2017-02-10 DIAGNOSIS — Z961 Presence of intraocular lens: Secondary | ICD-10-CM | POA: Diagnosis not present

## 2017-02-10 DIAGNOSIS — Z9889 Other specified postprocedural states: Secondary | ICD-10-CM | POA: Diagnosis not present

## 2017-02-10 DIAGNOSIS — H18452 Nodular corneal degeneration, left eye: Secondary | ICD-10-CM | POA: Diagnosis not present

## 2017-02-12 DIAGNOSIS — G4733 Obstructive sleep apnea (adult) (pediatric): Secondary | ICD-10-CM | POA: Diagnosis not present

## 2017-02-24 DIAGNOSIS — R69 Illness, unspecified: Secondary | ICD-10-CM | POA: Diagnosis not present

## 2017-03-05 ENCOUNTER — Other Ambulatory Visit: Payer: Self-pay | Admitting: Family Medicine

## 2017-04-02 ENCOUNTER — Encounter: Payer: Self-pay | Admitting: Family Medicine

## 2017-04-06 ENCOUNTER — Ambulatory Visit (INDEPENDENT_AMBULATORY_CARE_PROVIDER_SITE_OTHER): Payer: Medicare HMO | Admitting: *Deleted

## 2017-04-06 DIAGNOSIS — Z23 Encounter for immunization: Secondary | ICD-10-CM | POA: Diagnosis not present

## 2017-04-22 ENCOUNTER — Encounter: Payer: Self-pay | Admitting: Family Medicine

## 2017-05-06 DIAGNOSIS — G4733 Obstructive sleep apnea (adult) (pediatric): Secondary | ICD-10-CM | POA: Diagnosis not present

## 2017-06-02 ENCOUNTER — Other Ambulatory Visit: Payer: Self-pay | Admitting: Family Medicine

## 2017-06-10 ENCOUNTER — Encounter: Payer: Self-pay | Admitting: Internal Medicine

## 2017-06-10 ENCOUNTER — Ambulatory Visit (INDEPENDENT_AMBULATORY_CARE_PROVIDER_SITE_OTHER)
Admission: RE | Admit: 2017-06-10 | Discharge: 2017-06-10 | Disposition: A | Discharge status: Home / Self Care | Payer: Medicare HMO | Source: Ambulatory Visit | Attending: Internal Medicine | Admitting: Internal Medicine

## 2017-06-10 ENCOUNTER — Ambulatory Visit: Payer: Medicare HMO | Admitting: Internal Medicine

## 2017-06-10 VITALS — BP 140/80 | HR 66 | Ht 71.0 in | Wt 234.4 lb

## 2017-06-10 DIAGNOSIS — J449 Chronic obstructive pulmonary disease, unspecified: Secondary | ICD-10-CM | POA: Diagnosis not present

## 2017-06-10 DIAGNOSIS — R0602 Shortness of breath: Secondary | ICD-10-CM | POA: Diagnosis not present

## 2017-06-10 DIAGNOSIS — G4733 Obstructive sleep apnea (adult) (pediatric): Secondary | ICD-10-CM | POA: Diagnosis not present

## 2017-06-10 NOTE — Assessment & Plan Note (Addendum)
Symptoms limited to dyspnea with exertion suggest primarily and emphysema pattern.  He is comfortable with his meds.  Continued walking for endurance emphasized. Plan-continue current meds as discussed.  Update CXR

## 2017-06-10 NOTE — Assessment & Plan Note (Signed)
Continues to benefit from CPAP.  Pressures are comfortable.  Download indicates excellent compliance and control.  No changes needed at this time.

## 2017-06-10 NOTE — Progress Notes (Signed)
Subjective:    Patient ID: Kyle [name], male    DOB: 04/02/1946, 71 y.o.   MRN: [id number]  HPI   male former smoker followed for OSA, COPD GOLD III, Rhinitis Complicated by GERD, CAD/MI, HBP Unattended Home Sleep Test-01/05/2014, moderate OSA, AHI 19/hour, weight 223 pounds Office Spirometry 09/10/2016-severe obstructive airways disease: FVC 2.83/61%, FEV1 1.17/34%, ratio 0.41, FEF 25-75% 0.35/13% PFT 09/22/16-severe COPD, no response to dilator, diffusion moderately reduced, air trapping, hyperinflation. FVC 3.54/75%,  FEV1 1.7 2/49%, ratio 0.49, TLC 123%, DLCO 56% -----------------------------------------------------------------  12/08/16- 71 year old male former smoker followed for OSA, COPD, rhinitis complicated by GERD, CAD/MI, HBP CPAP auto 5-12/[facility] FOLLOWS FOR: Pt is not able to go up stairs with weighted items-finds himself getting SOB. Otherwise doing welll. Review PFT with patient. Pt wears CPAP nightly , no chip this visit, not enrolled in AV. DME [facility] Denies cough, wheeze or exertional chest pain. Realizes weight loss would help. PFT reviewed with him. PFT 09/22/16-severe COPD, no response to dilator, diffusion moderately reduced, air trapping, hyperinflation. FVC 3.54/75%,  FEV1 1.72 /49%, ratio 0.49, TLC 123%, DLCO 56%  06/10/17- 71 year old male former smoker followed for OSA, COPD, rhinitis complicated by GERD, CAD/MI, HBP CPAP auto 5-12/[facility]           UTD flu & p --Pt states that he has been doing good since last OV. C/o occ. SOB with exertion. Denies any cough or CP. Dyspnea on hills and stairs, sometimes only plays 9 holes of golf because of dyspnea.  No cough or wheeze.  Gym for exercise and walking at least 2 days/week.  Rare need for rescue inhaler but uses Bevespi. Comfortable with his CPAP.  Download 100% compliance AHI 5.5/hour.  ROS-see HPI   + = positive Constitutional:    weight loss, night sweats, fevers, chills, fatigue, lassitude. HEENT:     headaches, difficulty swallowing, tooth/dental problems, sore throat,       sneezing, itching, ear ache, nasal congestion, post nasal drip, snoring CV:    chest pain, orthopnea, PND, swelling in lower extremities, anasarca,                                                           dizziness, palpitations Resp:   +shortness of breath with exertion or at rest.                productive cough,   non-productive cough, coughing up of blood.              change in color of mucus.  wheezing.   Skin:    rash or lesions. GI:  No-   heartburn, indigestion, abdominal pain, nausea, vomiting, diarrhea,                 change in bowel habits, loss of appetite GU: dysuria, change in color of urine, no urgency or frequency.   flank pain. MS:   joint pain, stiffness, decreased range of motion, back pain. Neuro-     nothing unusual Psych:  change in mood or affect.  depression or anxiety.   memory loss.     Objective:  OBJ- Physical Exam General- Alert, Oriented, Affect-appropriate, Distress- none acute,  Skin- rash-none, lesions- none, excoriation- none Lymphadenopathy- none Head- atraumatic  Eyes- Gross vision intact, PERRLA, conjunctivae and secretions clear            Ears- Hearing, canals-normal            Nose- Clear, no-Septal dev, mucus, polyps, erosion, perforation,              Throat- Mallampati III , mucosa clear , drainage- none, tonsils- atrophic Neck- flexible , trachea midline, no stridor , thyroid nl, carotid no bruit Chest - symmetrical excursion , unlabored           Heart/CV- RRR , no murmur , no gallop  , no rub, nl s1 s2                           - JVD- none , edema- none, stasis changes- none, varices- none           Lung- + unlabored, Clear, wheeze- none, cough- none , dullness-none, rub- none           Chest wall-  Abd-  Br/ Gen/ Rectal- Not done, not indicated Extrem- cyanosis- none, clubbing, none, atrophy- none, strength- nl Neuro- grossly intact to  observation  Assessment & Plan:

## 2017-06-10 NOTE — Patient Instructions (Signed)
Ok to continue  Current meds  Order CXR   Dx COPD mixed type  Please call if we can help

## 2017-06-13 DIAGNOSIS — G4733 Obstructive sleep apnea (adult) (pediatric): Secondary | ICD-10-CM | POA: Diagnosis not present

## 2017-08-09 NOTE — Progress Notes (Signed)
[facility] [address], [place]  [address] Phone: [phone] Fax:  [phone]  Date:  08/10/2017   ID:  Yaphet, [place] [birth year], MRN [id number]  PCP:  Eulas Post, MD   History of Present Illness: DELMA VILLALVA is a 72 y.o. male with coronary artery disease status post non-ST elevation myocardial infarction with hyperlipidemia, hypertension, obesity here for followup.  He had been noticing more increasing/significant shortness of breath with activity concerning for anginal symptoms. It is hard to distinguish whether or not this is secondary to COPD. No chest pain.Going up stair. Enjoys playing golf, when he went to the beach, is challenging for him to go upstairs. His wife was concerned. Therefore, he underwent cardiac catheterization on 05/05/16 which was reassuring, patent stent.    He underwent echocardiogram on 03/31/12 that showed an ejection fraction of 50% with base to mid inferior hypokinesis.  He also underwent a nuclear stress test at that time which showed overall low risk with no evidence of significant ischemia. His original cardiac catheterization was in June of 2011. Stent was placed to the right coronary artery at that time. This was in the setting of non-ST elevation myocardial infarction.  Dr. Annamaria Boots is been following him for his obstructive sleep apnea.  08/10/17-overall he has been doing well without any chest discomfort.  No significant change with shortness of breath, activity.  COPD fairly significant.  Dr. Janee Morn note reviewed. [employer]. 2.5 miles. Lightheaded reaching up.   Wt Readings from Last 3 Encounters:  08/10/17 236 lb (107 kg)  06/10/17 234 lb 6.4 oz (106.3 kg)  12/08/16 236 lb 9.6 oz (107.3 kg)     Past Medical History:  Diagnosis Date  . CAD (coronary artery disease) 2011  . Cardiac arrhythmia   . COPD (chronic obstructive pulmonary disease) ([place])   . GERD (gastroesophageal reflux disease)   . Hyperlipidemia   .  Hypertension   . Myocardial infarction [facility]) 2011    Past Surgical History:  Procedure Laterality Date  . APPENDECTOMY    . CARDIAC CATHETERIZATION N/A 05/05/2016   Procedure: Left Heart Cath and Coronary Angiography;  Surgeon: Jerline Pain, MD;  Location: [facility];  Service: Cardiovascular;  Laterality: N/A;  . CORONARY ANGIOPLASTY WITH STENT PLACEMENT    . EYE SURGERY     lasix /BIL    Current Outpatient Medications  Medication Sig Dispense Refill  . aspirin 81 MG tablet Take 81 mg by mouth daily.    Marland Kitchen atorvastatin (LIPITOR) 40 MG tablet TAKE ONE TABLET BY MOUTH DAILY AT 6PM - NEED OFFICE VISIT FOR FURTHER REFILLS 30 tablet 0  . benazepril-hydrochlorthiazide (LOTENSIN HCT) 20-25 MG tablet Take 0.5 tablets by mouth daily. 45 tablet 3  . cetirizine (ZYRTEC) 10 MG tablet Take 10 mg by mouth daily.    . Cyanocobalamin (B-12 PO) Take 1 tablet by mouth daily.    . Glycopyrrolate-Formoterol (BEVESPI AEROSPHERE) 9-4.8 MCG/ACT AERO Inhale 2 puffs into the lungs 2 (two) times daily. 1 Inhaler 12  . levothyroxine (SYNTHROID, LEVOTHROID) 50 MCG tablet TAKE 1 TABLET (50 MCG TOTAL) BY MOUTH DAILY BEFORE BREAKFAST. 60 tablet 5  . Multiple Vitamin (MULTIVITAMIN) tablet Take 1 tablet by mouth daily.    Marland Kitchen omeprazole (PRILOSEC OTC) 20 MG tablet Take 20 mg by mouth daily as needed (heartburn).      No current facility-administered medications for this visit.     Allergies:   No Known  Allergies  Social History:  The patient  reports that he quit smoking about 23 years ago. His smoking use included cigarettes. He has a 112.00 pack-year smoking history. he has never used smokeless tobacco. He reports that he drinks about 2.5 oz of alcohol per week. He reports that he does not use drugs.   Family History  Problem Relation Age of Onset  . Stroke Mother   . Stroke Father   . Multiple sclerosis Sister     ROS:  Please see the history of present illness.  Unless specified above all other  review of systems negative  PHYSICAL EXAM: VS:  BP 120/72   Pulse 78   Ht 5\' 11"  (1.803 m)   Wt 236 lb (107 kg)   BMI 32.92 kg/m  GEN: Well nourished, well developed, in no acute distress  HEENT: normal  Neck: no JVD, carotid bruits, or masses Cardiac: RRR; no murmurs, rubs, or gallops,no edema  Respiratory:  Distant BS,  normal work of breathing GI: soft, nontender, nondistended, + BS MS: no deformity or atrophy  Skin: warm and dry, no rash Neuro:  Alert and Oriented x 3, Strength and sensation are intact Psych: euthymic mood, full affect   EKG: Today: 08/10/17-sinus rhythm 78 with no other abnormalities personally viewed-prior 04/27/16-sinus bradycardia rate 54 no other abnormalities. 04/12/15-size bradycardia rate 57 with no other abnormalities personally viewed- 03/07/14-sinus bradycardia rate 59 with PVCs, 2 seen on EKG    LABS: LDL 59 on 06/09/13  Cath 05/05/16:  The left ventricular systolic function is normal.  LV end diastolic pressure is normal.  There is no aortic valve stenosis.  There is no mitral valve regurgitation.  Previously placed right coronary artery mid stent widely patent.  30-40% distal RCA stenosis  Minor luminal irregularities in left system.   Reassuring. Nonobstructive coronary artery disease. Patent right coronary stent. Continue with medical management. Candee Furbish, MD  ASSESSMENT AND PLAN:  1. CAD -post cardiac catheterization on 05/05/16 where his previously placed right coronary artery mid stent was widely patent.  Reassuring.  Continue with aggressive secondary risk factor prevention.  Due to prior bradycardia and decreased energy, his low-dose metoprolol was discontinued at a prior office visit. encouraged him to use Silver sneakers, exercise. This will be good for both his heart and his lungs. 2. Hypertension-well-controlled. Mild dizziness post exercise at times, when raising hands above head for instance. Will ask him to cut his ACE/HCTZ in  half. Monitor BP. Let us know if >140.  3. Hyperlipidemia-atorvastatin 40. LDL goal less than 70.  Medications reviewed 4. PVC's-Asymptomatic.   Seems to be doing quite well without beta-blocker 5. OSA - he does feel decreased shortness of breath after wearing mask. This is being managed by pulmonary. Dr. Annamaria Boots.  6. COPD-many years of smoking. Pulmonary, Dr. Annamaria Boots. If no significant evidence of coronary artery disease present, it is likely that his COPD is causing most of his shortness of breath. Improving with [employer].   One year followup  Signed, Candee Furbish, MD [facility]  08/10/2017 9:27 AM

## 2017-08-10 ENCOUNTER — Encounter: Payer: Self-pay | Admitting: Cardiology

## 2017-08-10 ENCOUNTER — Ambulatory Visit: Payer: Medicare HMO | Admitting: Cardiology

## 2017-08-10 VITALS — BP 120/72 | HR 78 | Ht 71.0 in | Wt 236.0 lb

## 2017-08-10 DIAGNOSIS — J449 Chronic obstructive pulmonary disease, unspecified: Secondary | ICD-10-CM | POA: Diagnosis not present

## 2017-08-10 DIAGNOSIS — I10 Essential (primary) hypertension: Secondary | ICD-10-CM

## 2017-08-10 DIAGNOSIS — I2510 Atherosclerotic heart disease of native coronary artery without angina pectoris: Secondary | ICD-10-CM

## 2017-08-10 MED ORDER — BENAZEPRIL-HYDROCHLOROTHIAZIDE 20-25 MG PO TABS: 0.5000 | ORAL_TABLET | Freq: Every day | ORAL | 3 refills | Status: DC

## 2017-08-10 NOTE — Patient Instructions (Addendum)
Medication Instructions:  Please decrease your Benazepril/HCTZ to 1/2 tablet a day.  Monitor your blood pressures and let us know if you notice ay elevation or have continued dizziness. Continue all other medications as listed.  Follow-Up: Follow up in 1 year with Dr. Marlou Porch.  You will receive a letter in the mail 2 months before you are due.  Please call us when you receive this letter to schedule your follow up appointment.  If you need a refill on your cardiac medications before your next appointment, please call your pharmacy.  Thank you for choosing [facility]!!

## 2017-09-10 ENCOUNTER — Ambulatory Visit: Payer: Medicare HMO | Admitting: Internal Medicine

## 2017-09-11 ENCOUNTER — Other Ambulatory Visit: Payer: Self-pay | Admitting: Family Medicine

## 2017-09-15 ENCOUNTER — Encounter: Payer: Self-pay | Admitting: Family Medicine

## 2017-09-15 ENCOUNTER — Ambulatory Visit (INDEPENDENT_AMBULATORY_CARE_PROVIDER_SITE_OTHER): Payer: Medicare HMO | Admitting: Family Medicine

## 2017-09-15 VITALS — BP 120/70 | HR 65 | Temp 98.2°F | Wt 230.7 lb

## 2017-09-15 DIAGNOSIS — E038 Other specified hypothyroidism: Secondary | ICD-10-CM

## 2017-09-15 DIAGNOSIS — E7800 Pure hypercholesterolemia, unspecified: Secondary | ICD-10-CM | POA: Diagnosis not present

## 2017-09-15 DIAGNOSIS — I2510 Atherosclerotic heart disease of native coronary artery without angina pectoris: Secondary | ICD-10-CM

## 2017-09-15 DIAGNOSIS — I10 Essential (primary) hypertension: Secondary | ICD-10-CM

## 2017-09-15 MED ORDER — LEVOTHYROXINE SODIUM 50 MCG PO TABS: ORAL_TABLET | ORAL | 3 refills | Status: DC

## 2017-09-15 MED ORDER — ATORVASTATIN CALCIUM 40 MG PO TABS: ORAL_TABLET | ORAL | 3 refills | Status: DC

## 2017-09-15 NOTE — Progress Notes (Signed)
Subjective:     Patient ID: Kyle [name], male   DOB: March 12, 1946, 72 y.o.   MRN: [id number]  HPI Patient seen for medical follow-up and requesting medication refills. He has not had lab work in over a year. Chronic problems include history of CAD, hypertension, COPD, hypothyroidism, obstructive sleep apnea, obesity, and hyperlipidemia.  Had been having some progressive shortness of breath. He started exercise program early January. He joined a gym and has been walking up to 3 miles per day and also doing some other types of equipment. He states his breathing has improved greatly. He has lost 6 pounds thus far.  No chest pain with exercise. Requesting refills levothyroxin and Lipitor. Medications reviewed. Compliant with all. He states he feels much better overall since starting exercise. He has made some positive dietary changes as well  Past Medical History:  Diagnosis Date  . CAD (coronary artery disease) 2011  . Cardiac arrhythmia   . COPD (chronic obstructive pulmonary disease) ([place])   . GERD (gastroesophageal reflux disease)   . Hyperlipidemia   . Hypertension   . Myocardial infarction [facility]) 2011   Past Surgical History:  Procedure Laterality Date  . APPENDECTOMY    . CARDIAC CATHETERIZATION N/A 05/05/2016   Procedure: Left Heart Cath and Coronary Angiography;  Surgeon: Jerline Pain, MD;  Location: [facility];  Service: Cardiovascular;  Laterality: N/A;  . CORONARY ANGIOPLASTY WITH STENT PLACEMENT    . EYE SURGERY     lasix /BIL    reports that he quit smoking about 23 years ago. His smoking use included cigarettes. He has a 112.00 pack-year smoking history. he has never used smokeless tobacco. He reports that he drinks about 2.5 oz of alcohol per week. He reports that he does not use drugs. family history includes Multiple sclerosis in his sister; Stroke in his father and mother. No Known Allergies   Review of Systems  Constitutional: Negative for fatigue and  unexpected weight change.  Eyes: Negative for visual disturbance.  Respiratory: Negative for cough, chest tightness and wheezing.   Cardiovascular: Negative for chest pain, palpitations and leg swelling.  Gastrointestinal: Negative for abdominal pain.  Endocrine: Negative for polydipsia and polyuria.  Neurological: Negative for dizziness, syncope, weakness, light-headedness and headaches.       Objective:   Physical Exam  Constitutional: He appears well-developed and well-nourished.  HENT:  Mouth/Throat: Oropharynx is clear and moist.  Neck: Neck supple.  Cardiovascular: Normal rate and regular rhythm.  Pulmonary/Chest: Effort normal and breath sounds normal. No respiratory distress. He has no wheezes. He has no rales.  Musculoskeletal: He exhibits no edema.  Psychiatric: He has a normal mood and affect. His behavior is normal.       Assessment:     #1 history of CAD with cardiac cath 10/17 patent RCA stent.  No recent angina symptoms with exercise  #2 hypertension stable and at goal. Was having some dizziness post exercise and cardiology had him reduce his ACE/HCTZ in half and tolerating well  #3 hyperlipidemia on Lipitor with goal LDL less than 70  #4 hypothyroidism  #5 COPD followed by pulmonary. Dyspnea improving with exercise    Plan:     -Follow-up labs with lipid panel, hepatic panel, basic metabolic panel, TSH -Refill levothyroxin and atorvastatin for one year -Continue regular exercise habits. Hopefully, continue with his weight loss  Eulas Post MD  [facility] at [facility]

## 2017-09-16 ENCOUNTER — Encounter: Payer: Self-pay | Admitting: Internal Medicine

## 2017-09-16 ENCOUNTER — Ambulatory Visit: Payer: Medicare HMO | Admitting: Internal Medicine

## 2017-09-16 VITALS — BP 128/68 | HR 77 | Ht 71.0 in | Wt 229.2 lb

## 2017-09-16 DIAGNOSIS — J449 Chronic obstructive pulmonary disease, unspecified: Secondary | ICD-10-CM

## 2017-09-16 DIAGNOSIS — G4733 Obstructive sleep apnea (adult) (pediatric): Secondary | ICD-10-CM

## 2017-09-16 LAB — HEPATIC FUNCTION PANEL
ALK PHOS: 64 U/L (ref 39–117)
ALT: 24 U/L (ref 0–53)
AST: 27 U/L (ref 0–37)
Albumin: 4.2 g/dL (ref 3.5–5.2)
BILIRUBIN DIRECT: 0.1 mg/dL (ref 0.0–0.3)
BILIRUBIN TOTAL: 0.5 mg/dL (ref 0.2–1.2)
Total Protein: 6.9 g/dL (ref 6.0–8.3)

## 2017-09-16 LAB — BASIC METABOLIC PANEL
BUN: 19 mg/dL (ref 6–23)
CALCIUM: 9.3 mg/dL (ref 8.4–10.5)
CO2: 28 mEq/L (ref 19–32)
CREATININE: 1.16 mg/dL (ref 0.40–1.50)
Chloride: 104 mEq/L (ref 96–112)
GFR: 66.02 mL/min (ref >60.00)
Glucose, Bld: 95 mg/dL (ref 70–99)
Potassium: 4.1 mEq/L (ref 3.5–5.1)
Sodium: 140 mEq/L (ref 135–145)

## 2017-09-16 LAB — LIPID PANEL
CHOLESTEROL: 89 mg/dL (ref 0–200)
HDL: 26.3 mg/dL — ABNORMAL LOW (ref >39.00)
LDL Cholesterol: 46 mg/dL (ref 0–99)
NonHDL: 63
Total CHOL/HDL Ratio: 3
Triglycerides: 86 mg/dL (ref 0.0–149.0)
VLDL: 17.2 mg/dL (ref 0.0–40.0)

## 2017-09-16 LAB — TSH: TSH: 5.66 u[IU]/mL — AB (ref 0.35–4.50)

## 2017-09-16 NOTE — Patient Instructions (Signed)
Ok to try off and on Bevespi for comparison  We can continue CPAP auto 5-12, mask of choice, humidifier, supplies, AirView  Please call if we can help

## 2017-09-16 NOTE — Progress Notes (Signed)
Subjective:    Patient ID: Kyle [name], male    DOB: [birth year], 72 y.o.   MRN: [id number]  HPI   male former smoker followed for OSA, COPD GOLD III, Rhinitis Complicated by GERD, CAD/MI, HBP Unattended Home Sleep Test-01/05/2014, moderate OSA, AHI 19/hour, weight 223 pounds Office Spirometry 09/10/2016-severe obstructive airways disease: FVC 2.83/61%, FEV1 1.17/34%, ratio 0.41, FEF 25-75% 0.35/13% PFT 09/22/16-severe COPD, no response to dilator, diffusion moderately reduced, air trapping, hyperinflation. FVC 3.54/75%,  FEV1 1.7 2/49%, ratio 0.49, TLC 123%, DLCO 56% -----------------------------------------------------------------  06/10/17- 72 year old male former smoker followed for OSA, COPD, rhinitis complicated by GERD, CAD/MI, HBP CPAP auto 5-12/[facility]           UTD flu & p --Pt states that he has been doing good since last OV. C/o occ. SOB with exertion. Denies any cough or CP. Dyspnea on hills and stairs, sometimes only plays 9 holes of golf because of dyspnea.  No cough or wheeze.  Gym for exercise and walking at least 2 days/week.  Rare need for rescue inhaler but uses Bevespi. Comfortable with his CPAP.  Download 100% compliance AHI 5.5/hour.  09/16/17- 72 year old male former smoker followed for OSA, COPD, rhinitis complicated by GERD, CAD/MI, HBP CPAP auto 5-12/[facility]  PFT shows emphysema pattern-discussed. ----OSA: JJH:[id number]; Pt wears CPAP nightly. DL attached. No new supplies needed at this time and pressure works well. Bevespi-cannot tell it is doing anything.  He is going to try on and off.  Little routine wheeze or cough. Download 100% compliance, AHI 5.5/hour.  Very pleased with CPAP-sleeps better. Going to gym 2 or 3 times a week and walking a lot.  Has build his stamina. CXR 06/10/17 IMPRESSION: No acute abnormality. Mildly progressive changes of COPD and chronic bronchitis. PFT 09/22/16-severe obstructive airways disease, no response to dilator, overinflation,  diffusion moderately reduced.  FVC 3.54/75%, FEV1 1.72/49%, ratio 0.49, TLC 123%, DLCO 56%  ROS-see HPI   + = positive Constitutional:    weight loss, night sweats, fevers, chills, fatigue, lassitude. HEENT:    headaches, difficulty swallowing, tooth/dental problems, sore throat,       sneezing, itching, ear ache, nasal congestion, post nasal drip, snoring CV:    chest pain, orthopnea, PND, swelling in lower extremities, anasarca,                                                           dizziness, palpitations Resp:   +shortness of breath with exertion or at rest.                productive cough,   non-productive cough, coughing up of blood.              change in color of mucus.  wheezing.   Skin:    rash or lesions. GI:  No-   heartburn, indigestion, abdominal pain, nausea, vomiting, diarrhea,                 change in bowel habits, loss of appetite GU: dysuria, change in color of urine, no urgency or frequency.   flank pain. MS:   joint pain, stiffness, decreased range of motion, back pain. Neuro-     nothing unusual Psych:  change in mood or affect.  depression or anxiety.   memory loss.  Objective:  OBJ- Physical Exam General- Alert, Oriented, Affect-appropriate, Distress- none acute,  Skin- rash-none, lesions- none, excoriation- none Lymphadenopathy- none Head- atraumatic            Eyes- Gross vision intact, PERRLA, conjunctivae and secretions clear            Ears- Hearing, canals-normal            Nose- Clear, no-Septal dev, mucus, polyps, erosion, perforation,              Throat- Mallampati III , mucosa clear , drainage- none, tonsils- atrophic Neck- flexible , trachea midline, no stridor , thyroid nl, carotid no bruit Chest - symmetrical excursion , unlabored           Heart/CV- RRR , no murmur , no gallop  , no rub, nl s1 s2                           - JVD- none , edema- none, stasis changes- none, varices- none           Lung- + unlabored, Clear, wheeze- none,  cough- none , dullness-none, rub- none           Chest wall-  Abd-  Br/ Gen/ Rectal- Not done, not indicated Extrem- cyanosis- none, clubbing, none, atrophy- none, strength- nl Neuro- grossly intact to observation  Assessment & Plan:

## 2017-09-17 ENCOUNTER — Other Ambulatory Visit: Payer: Self-pay | Admitting: Family Medicine

## 2017-09-17 DIAGNOSIS — E038 Other specified hypothyroidism: Secondary | ICD-10-CM

## 2017-09-17 MED ORDER — LEVOTHYROXINE SODIUM 75 MCG PO TABS: 75.0000 ug | ORAL_TABLET | Freq: Every day | ORAL | 0 refills | Status: DC

## 2017-09-17 NOTE — Assessment & Plan Note (Signed)
Clinically stable, pure emphysema pattern with little airway irritability.  Not surprising he does not recognize benefit from [place]. Plan-continue exercise for strength and stamina.  Try Bevespi on a week, off a week and stop it if not helpful.

## 2017-09-17 NOTE — Assessment & Plan Note (Signed)
He continues to benefit, sleeping better with CPAP and very comfortable.  Download confirms good compliance and control. Plan-continue auto 5-12

## 2017-09-22 DIAGNOSIS — G4733 Obstructive sleep apnea (adult) (pediatric): Secondary | ICD-10-CM | POA: Diagnosis not present

## 2017-10-04 ENCOUNTER — Ambulatory Visit: Payer: Self-pay

## 2017-10-04 ENCOUNTER — Encounter: Payer: Self-pay | Admitting: Family Medicine

## 2017-10-04 ENCOUNTER — Ambulatory Visit (INDEPENDENT_AMBULATORY_CARE_PROVIDER_SITE_OTHER): Payer: Medicare HMO | Admitting: Family Medicine

## 2017-10-04 VITALS — BP 140/70 | HR 82 | Temp 98.0°F | Wt 225.9 lb

## 2017-10-04 DIAGNOSIS — G4733 Obstructive sleep apnea (adult) (pediatric): Secondary | ICD-10-CM | POA: Diagnosis not present

## 2017-10-04 DIAGNOSIS — J441 Chronic obstructive pulmonary disease with (acute) exacerbation: Secondary | ICD-10-CM

## 2017-10-04 MED ORDER — DOXYCYCLINE HYCLATE 100 MG PO CAPS: 100.0000 mg | ORAL_CAPSULE | Freq: Two times a day (BID) | ORAL | 0 refills | Status: DC

## 2017-10-04 MED ORDER — PREDNISONE 10 MG PO TABS: ORAL_TABLET | ORAL | 0 refills | Status: DC

## 2017-10-04 NOTE — Patient Instructions (Signed)
Chronic Obstructive Pulmonary Disease Exacerbation Chronic obstructive pulmonary disease (COPD) is a long-term (chronic) condition that affects the lungs. COPD is a general term that can be used to describe many different lung problems that cause lung swelling (inflammation) and limit airflow, including chronic bronchitis and emphysema. COPD exacerbations are episodes when breathing symptoms become much worse and require extra treatment. COPD exacerbations are usually caused by infections. Without treatment, COPD exacerbations can be severe and even life threatening. Frequent COPD exacerbations can cause further damage to the lungs. What are the causes? This condition may be caused by:  Respiratory infections, including viral and bacterial infections.  Exposure to smoke.  Exposure to air pollution, chemical fumes, or dust.  Things that give you an allergic reaction (allergens).  Not taking your usual COPD medicines as directed.  Underlying medical problems, such as congestive heart failure or infections not involving the lungs.  In many cases, the cause (trigger) of this condition is not known. What increases the risk? The following factors may make you more likely to develop this condition:  Smoking cigarettes.  Old age.  Frequent prior COPD exacerbations.  What are the signs or symptoms? Symptoms of this condition include:  Increased coughing.  Increased production of mucus from your lungs (sputum).  Increased wheezing.  Increased shortness of breath.  Rapid or labored breathing.  Chest tightness.  Less energy than usual.  Sleep disruption from symptoms.  Confusion or increased sleepiness.  Often these symptoms happen or get worse even with the use of medicines. How is this diagnosed? This condition is diagnosed based on:  Your medical history.  A physical exam.  You may also have tests, including:  A chest X-ray.  Blood tests.  Lung (pulmonary)  function tests.  How is this treated? Treatment for this condition depends on the severity and cause of the symptoms. You may need to be admitted to a hospital for treatment. Some of the treatments commonly used to treat COPD exacerbations are:  Antibiotic medicines. These may be used for severe exacerbations caused by a lung infection, such as pneumonia.  Bronchodilators. These are inhaled medicines that expand the air passages and allow increased airflow.  Steroid medicines. These act to reduce inflammation in the airways. They may be given with an inhaler, taken by mouth, or given through an IV tube inserted into one of your veins.  Supplemental oxygen therapy.  Airway clearing techniques, such as noninvasive ventilation (NIV) and positive expiratory pressure (PEP). These provide respiratory support through a mask or other noninvasive device. An example of this would be using a continuous positive airway pressure (CPAP) machine to improve delivery of oxygen into your lungs.  Follow these instructions at home: Medicines  Take over-the-counter and prescription medicines only as told by your health care provider. It is important to use correct technique with inhaled medicines.  If you were prescribed an antibiotic medicine or oral steroid, take it as told by your health care provider. Do not stop taking the medicine even if you start to feel better. Lifestyle  Eat a healthy diet.  Exercise regularly.  Get plenty of sleep.  Avoid exposure to all substances that irritate the airway, especially to tobacco smoke.  Wash your hands often with soap and water to reduce the risk of infection. If soap and water are not available, use hand sanitizer.  During flu season, avoid enclosed spaces that are crowded with people. General instructions  Drink enough fluid to keep your urine clear or pale yellow (  unless you have a medical condition that requires fluid restriction).  Use a cool mist  vaporizer. This humidifies the air and makes it easier for you to clear your chest when you cough.  If you have a home nebulizer and oxygen, continue to use them as told by your health care provider.  Keep all follow-up visits as told by your health care provider. This is important. How is this prevented?  Stay up-to-date on pneumococcal and influenza (flu) vaccines. A flu shot is recommended every year to help prevent exacerbations.  Do not use any products that contain nicotine or tobacco, such as cigarettes and e-cigarettes. Quitting smoking is very important in preventing COPD from getting worse and in preventing exacerbations from happening as often. If you need help quitting, ask your health care provider.  Follow all instructions for pulmonary rehabilitation after a recent exacerbation. This can help prevent future exacerbations.  Work with your health care provider to develop and follow an action plan. This tells you what steps to take when you experience certain symptoms. Contact a health care provider if:  You have a worsening of your regular COPD symptoms. Get help right away if:  You have worsening shortness of breath, even when resting.  You have trouble talking.  You have severe chest pain.  You cough up blood.  You have a fever.  You have weakness, vomit repeatedly, or faint.  You feel confused.  You are not able to sleep because of your symptoms.  You have trouble doing daily activities. Summary  COPD exacerbations are episodes when breathing symptoms become much worse and require extra treatment above your normal treatment.  Exacerbations can be severe and even life threatening. Frequent COPD exacerbations can cause further damage to your lungs.  COPD exacerbations are usually triggered by infections such as the flu, colds, and even pneumonia.  Treatment for this condition depends on the severity and cause of the symptoms. You may need to be admitted to a  hospital for treatment.  Quitting smoking is very important to prevent COPD from getting worse and to prevent exacerbations from happening as often. This information is not intended to replace advice given to you by your health care provider. Make sure you discuss any questions you have with your health care provider. Document Released: 05/17/2007 Document Revised: 08/24/2016 Document Reviewed: 08/24/2016 Elsevier Interactive Patient Education  2018 Elsevier Inc.  

## 2017-10-04 NOTE — Telephone Encounter (Signed)
   Reason for Disposition . SEVERE coughing spells (e.g., whooping sound after coughing, vomiting after coughing)  Answer Assessment - Initial Assessment Questions 1. ONSET: "When did the cough begin?"      Started 1 week ago 2. SEVERITY: "How bad is the cough today?"      severe 3. RESPIRATORY DISTRESS: "Describe your breathing."      Short of breath 4. FEVER: "Do you have a fever?" If so, ask: "What is your temperature, how was it measured, and when did it start?"     No 5. HEMOPTYSIS: "Are you coughing up any blood?" If so ask: "How much?" (flecks, streaks, tablespoons, etc.)     No 6. TREATMENT: "What have you done so far to treat the cough?" (e.g., meds, fluids, humidifier)     Mucinex. Uses CPAP 7. CARDIAC HISTORY: "Do you have any history of heart disease?" (e.g., heart attack, congestive heart failure)      Heart attack with stent; HTN 8. LUNG HISTORY: "Do you have any history of lung disease?"  (e.g., pulmonary embolus, asthma, emphysema)     copd 9. PE RISK FACTORS: "Do you have a history of blood clots?" (or: recent major surgery, recent prolonged travel, bedridden )     No 10. OTHER SYMPTOMS: "Do you have any other symptoms? (e.g., runny nose, wheezing, chest pain)       Runny nose, wheezing 11. PREGNANCY: "Is there any chance you are pregnant?" "When was your last menstrual period?"       No 12. TRAVEL: "Have you traveled out of the country in the last month?" (e.g., travel history, exposures)       No  Protocols used: COUGH - ACUTE NON-PRODUCTIVE-A-AH

## 2017-10-04 NOTE — Progress Notes (Signed)
Subjective:     Patient ID: Kyle [name], male   DOB: 20-Oct-1945, 72 y.o.   MRN: [id number]  HPI Patient seen with cough and congestion for over week. He has diagnosis of COPD. Has been prescribed Bevespi per pulmonary but has not been taking this (he states "it did not work"). He denies any body aches. His cough is mostly dry but occasionally productive of yellow sputum. He's had increased dyspnea the past week over his baseline. Denies any chest pain. No fever. Increasing production of yellow sputum.  Past Medical History:  Diagnosis Date  . CAD (coronary artery disease) 2011  . Cardiac arrhythmia   . COPD (chronic obstructive pulmonary disease) ([place])   . GERD (gastroesophageal reflux disease)   . Hyperlipidemia   . Hypertension   . Myocardial infarction [name]) 2011   Past Surgical History:  Procedure Laterality Date  . APPENDECTOMY    . CARDIAC CATHETERIZATION N/A 05/05/2016   Procedure: Left Heart Cath and Coronary Angiography;  Surgeon: Jerline Pain, MD;  Location: [facility];  Service: Cardiovascular;  Laterality: N/A;  . CORONARY ANGIOPLASTY WITH STENT PLACEMENT    . EYE SURGERY     lasix /BIL    reports that he quit smoking about 23 years ago. His smoking use included cigarettes. He has a 112.00 pack-year smoking history. he has never used smokeless tobacco. He reports that he drinks about 2.5 oz of alcohol per week. He reports that he does not use drugs. family history includes Multiple sclerosis in his sister; Stroke in his father and mother. No Known Allergies   Review of Systems  Constitutional: Positive for fatigue. Negative for chills and fever.  Respiratory: Positive for cough and shortness of breath.   Cardiovascular: Negative for chest pain and leg swelling.       Objective:   Physical Exam  Constitutional: He appears well-developed and well-nourished.  HENT:  Right Ear: External ear normal.  Left Ear: External ear normal.  Mouth/Throat: Oropharynx  is clear and moist.  Neck: Neck supple.  Cardiovascular: Normal rate.  Pulmonary/Chest:  Patient somewhat diminished breath sounds throughout. No rales. No retractions  Musculoskeletal: He exhibits no edema.  Lymphadenopathy:    He has no cervical adenopathy.       Assessment:     Acute exacerbation of COPD    Plan:     -Doxycycline 100 mg twice daily for 7 days -Prednisone taper -Follow-up immediately for any fever or increasing shortness of breath  Eulas Post MD [facility] at [facility]

## 2017-10-08 ENCOUNTER — Telehealth: Payer: Self-pay | Admitting: Family Medicine

## 2017-10-08 MED ORDER — ALBUTEROL SULFATE HFA 108 (90 BASE) MCG/ACT IN AERS: 2.0000 | INHALATION_SPRAY | Freq: Four times a day (QID) | RESPIRATORY_TRACT | 3 refills | Status: DC | PRN

## 2017-10-08 NOTE — Telephone Encounter (Signed)
Send in Proair MDI- 2 puffs every 4-6 hours as needed for cough and wheeze.

## 2017-10-08 NOTE — Telephone Encounter (Signed)
Copied from [place] [id number]. >> Oct 08, 2017  9:56 AM Neva Seat wrote: Pt was seen on Mon - still sick and not getting better.   Coughing and breathing is hard.  Pt may need the inhaler that was discussed at visit. Please call pt to discuss.

## 2017-10-08 NOTE — Telephone Encounter (Signed)
Copied from [place] [id number]. Topic: General - Other >> Oct 08, 2017  9:56 AM Neva Seat wrote: Pt was seen on Mon - still sick and not getting better.   Coughing and breathing is hard.  Pt may need the inhaler that was discussed at visit. Please call pt to discuss.

## 2017-10-08 NOTE — Telephone Encounter (Signed)
Rx sent and patient is aware. 

## 2017-11-11 ENCOUNTER — Telehealth: Payer: Self-pay | Admitting: Internal Medicine

## 2017-11-11 DIAGNOSIS — G4733 Obstructive sleep apnea (adult) (pediatric): Secondary | ICD-10-CM

## 2017-11-11 NOTE — Telephone Encounter (Signed)
Spoke with pt's wife and she states something is wrong. He was doing ok on it but now he is snoring more and thinks it's not right. I advised her that he was on auto 5-12 already. I looked in [place] and he is downloaded but doesn't have data after 2/13/20189. I called [facility] to have them fax over download and they are going to fax it. Will await fax.

## 2017-11-11 NOTE — Telephone Encounter (Signed)
Received download and will place on CY cart.

## 2017-11-12 NOTE — Telephone Encounter (Signed)
Order- [facility]- please change CPAP to auto 5-20. Please continue AirView.

## 2017-11-12 NOTE — Telephone Encounter (Signed)
Spoke with pt's wife and relayed CY recommendations. She agreed and I placed the order for the CPAP setting. Nothing further is needed.

## 2017-11-15 ENCOUNTER — Other Ambulatory Visit (INDEPENDENT_AMBULATORY_CARE_PROVIDER_SITE_OTHER): Payer: Medicare HMO

## 2017-11-15 DIAGNOSIS — E038 Other specified hypothyroidism: Secondary | ICD-10-CM

## 2017-11-15 LAB — TSH: TSH: 2.34 u[IU]/mL (ref 0.35–4.50)

## 2017-12-11 ENCOUNTER — Other Ambulatory Visit: Payer: Self-pay | Admitting: Family Medicine

## 2017-12-11 DIAGNOSIS — G4733 Obstructive sleep apnea (adult) (pediatric): Secondary | ICD-10-CM | POA: Diagnosis not present

## 2017-12-24 DIAGNOSIS — G4733 Obstructive sleep apnea (adult) (pediatric): Secondary | ICD-10-CM | POA: Diagnosis not present

## 2018-01-25 ENCOUNTER — Other Ambulatory Visit: Payer: Self-pay | Admitting: Cardiology

## 2018-01-25 MED ORDER — BENAZEPRIL-HYDROCHLOROTHIAZIDE 20-25 MG PO TABS: 0.5000 | ORAL_TABLET | Freq: Every day | ORAL | 1 refills | Status: DC

## 2018-02-09 ENCOUNTER — Telehealth: Payer: Self-pay | Admitting: Internal Medicine

## 2018-02-09 NOTE — Telephone Encounter (Signed)
Called and spoke to Patient's wife, Kyle [name].  She stated that they called because the Patient is unable to sleep, due to so much air blowing from mask.  She stated that the Patient sometimes doesn't use it, because it keeps him awake.  She stated that they had called [facility] and they were not very helpful.  He has changed mask several times and feels that nothing has worked.  Called and spoke with [place] at [facility] in [place].  I explained the problems they have had with the CPAP.  Levada Dy stated that they have a clinic on Fridays with someone who can help 0830-1130. Called Patient's wife Kyle [name] and explained what I was told by Levada Dy, about clinic time Friday.  She stated that they would try that and would call back if they had any other problems. Nothing further at this time

## 2018-02-16 DIAGNOSIS — H16402 Unspecified corneal neovascularization, left eye: Secondary | ICD-10-CM | POA: Diagnosis not present

## 2018-02-16 DIAGNOSIS — Z961 Presence of intraocular lens: Secondary | ICD-10-CM | POA: Diagnosis not present

## 2018-02-16 DIAGNOSIS — H18452 Nodular corneal degeneration, left eye: Secondary | ICD-10-CM | POA: Diagnosis not present

## 2018-02-16 DIAGNOSIS — Z9889 Other specified postprocedural states: Secondary | ICD-10-CM | POA: Diagnosis not present

## 2018-02-16 DIAGNOSIS — H26491 Other secondary cataract, right eye: Secondary | ICD-10-CM | POA: Diagnosis not present

## 2018-02-18 ENCOUNTER — Encounter: Payer: Self-pay | Admitting: Internal Medicine

## 2018-02-18 ENCOUNTER — Ambulatory Visit: Payer: Medicare HMO | Admitting: Internal Medicine

## 2018-02-18 VITALS — BP 128/88 | HR 91 | Ht 71.0 in | Wt 217.6 lb

## 2018-02-18 DIAGNOSIS — J449 Chronic obstructive pulmonary disease, unspecified: Secondary | ICD-10-CM | POA: Diagnosis not present

## 2018-02-18 DIAGNOSIS — G4733 Obstructive sleep apnea (adult) (pediatric): Secondary | ICD-10-CM | POA: Diagnosis not present

## 2018-02-18 NOTE — Progress Notes (Signed)
Subjective:    Patient ID: Kyle [name], male    DOB: [birth year], 72 y.o.   MRN: [id number]  HPI   male former smoker followed for OSA, COPD GOLD III, Rhinitis Complicated by GERD, CAD/MI, HBP Unattended Home Sleep Test-01/05/2014, moderate OSA, AHI 19/hour, weight 223 pounds Office Spirometry 09/10/2016-severe obstructive airways disease: FVC 2.83/61%, FEV1 1.17/34%, ratio 0.41, FEF 25-75% 0.35/13% PFT 09/22/16-severe COPD, no response to dilator, diffusion moderately reduced, air trapping, hyperinflation. FVC 3.54/75%,  FEV1 1.7 2/49%, ratio 0.49, TLC 123%, DLCO 56% ----------------------------------------------------------------- 09/16/17- 72 year old male former smoker followed for OSA, COPD, rhinitis complicated by GERD, CAD/MI, HBP CPAP auto 5-20/[facility]  PFT shows emphysema pattern-discussed. ----OSA: YIF:[id number]; Pt wears CPAP nightly. DL attached. No new supplies needed at this time and pressure works well. Bevespi-cannot tell it is doing anything.  He is going to try on and off.  Little routine wheeze or cough. Download 100% compliance, AHI 5.5/hour.  Very pleased with CPAP-sleeps better. Going to gym 2 or 3 times a week and walking a lot.  Has build his stamina. CXR 06/10/17 IMPRESSION: No acute abnormality. Mildly progressive changes of COPD and chronic bronchitis. PFT 09/22/16-severe obstructive airways disease, no response to dilator, overinflation, diffusion moderately reduced.  FVC 3.54/75%, FEV1 1.72/49%, ratio 0.49, TLC 123%, DLCO 56%  02/18/2018- 72 year old male former smoker followed for OSA, COPD, rhinitis complicated by GERD, CAD/MI, HBP CPAP auto 5-12/[facility]  -----FOLLOWS FOR: pt wearing cpap avg 6-7hr nightly. concerned about AHI- feels machine is blowing too strong.  Download 90% compliance AHI 20/hour, leak 120 He has been comfortable with his CPAP until recent months.  Now wife says he snores loudly through the machine at they are in separate bedrooms.  He has  lost 20 pounds, walking 3 miles daily at the gym.  DME company felt machine was working mechanically correctly but they have given him a replacement to try.  ROS-see HPI   + = positive Constitutional:    weight loss, night sweats, fevers, chills, fatigue, lassitude. HEENT:    headaches, difficulty swallowing, tooth/dental problems, sore throat,       sneezing, itching, ear ache, nasal congestion, post nasal drip, snoring CV:    chest pain, orthopnea, PND, swelling in lower extremities, anasarca,                                                           dizziness, palpitations Resp:   +shortness of breath with exertion or at rest.                productive cough,   non-productive cough, coughing up of blood.              change in color of mucus.  wheezing.   Skin:    rash or lesions. GI:  No-   heartburn, indigestion, abdominal pain, nausea, vomiting, diarrhea,                 change in bowel habits, loss of appetite GU: dysuria, change in color of urine, no urgency or frequency.   flank pain. MS:   joint pain, stiffness, decreased range of motion, back pain. Neuro-     nothing unusual Psych:  change in mood or affect.  depression or anxiety.   memory loss.     Objective:  OBJ- Physical Exam General- Alert, Oriented, Affect-appropriate, Distress- none acute, still overweight Skin- rash-none, lesions- none, excoriation- none Lymphadenopathy- none Head- atraumatic            Eyes- Gross vision intact, PERRLA, conjunctivae and secretions clear            Ears- Hearing, canals-normal            Nose- Clear, no-Septal dev, mucus, polyps, erosion, perforation,              Throat- Mallampati III , mucosa clear , drainage- none, tonsils- atrophic Neck- flexible , trachea midline, no stridor , thyroid nl, carotid no bruit Chest - symmetrical excursion , unlabored           Heart/CV- RRR , no murmur , no gallop  , no rub, nl s1 s2                           - JVD- none , edema- none, stasis  changes- none, varices- none           Lung- + unlabored, Clear, wheeze- none, cough- none , dullness-none, rub- none           Chest wall-  Abd-  Br/ Gen/ Rectal- Not done, not indicated Extrem- cyanosis- none, clubbing, none, atrophy- none, strength- nl Neuro- grossly intact to observation  Assessment & Plan:

## 2018-02-18 NOTE — Patient Instructions (Addendum)
Order- schedule BIPAP titration sleep study   Dx OSA   Keep on walking !!  Please cal as needed

## 2018-02-19 NOTE — Assessment & Plan Note (Signed)
He understands that he has severe COPD.  Little cough or wheeze.  Medications are adequate.  Continued exercise for stamina and weight loss will improve his dyspnea on exertion but eventually he may need oxygen.

## 2018-02-19 NOTE — Assessment & Plan Note (Signed)
Possibly his weight loss has created more mask leak, leading to the high AHI's. Plan-CPAP/BiPAP titration study

## 2018-02-27 IMAGING — DX DG CHEST 2V
2 series · 2 of 2 positions shown · non-contrast
Comparison: 10/11/2012.

CLINICAL DATA: Chronic shortness of breath for the past 4-5 years.
Hypertension. Ex-smoker. COPD.

EXAM:
CHEST  2 VIEW

[chest pa]
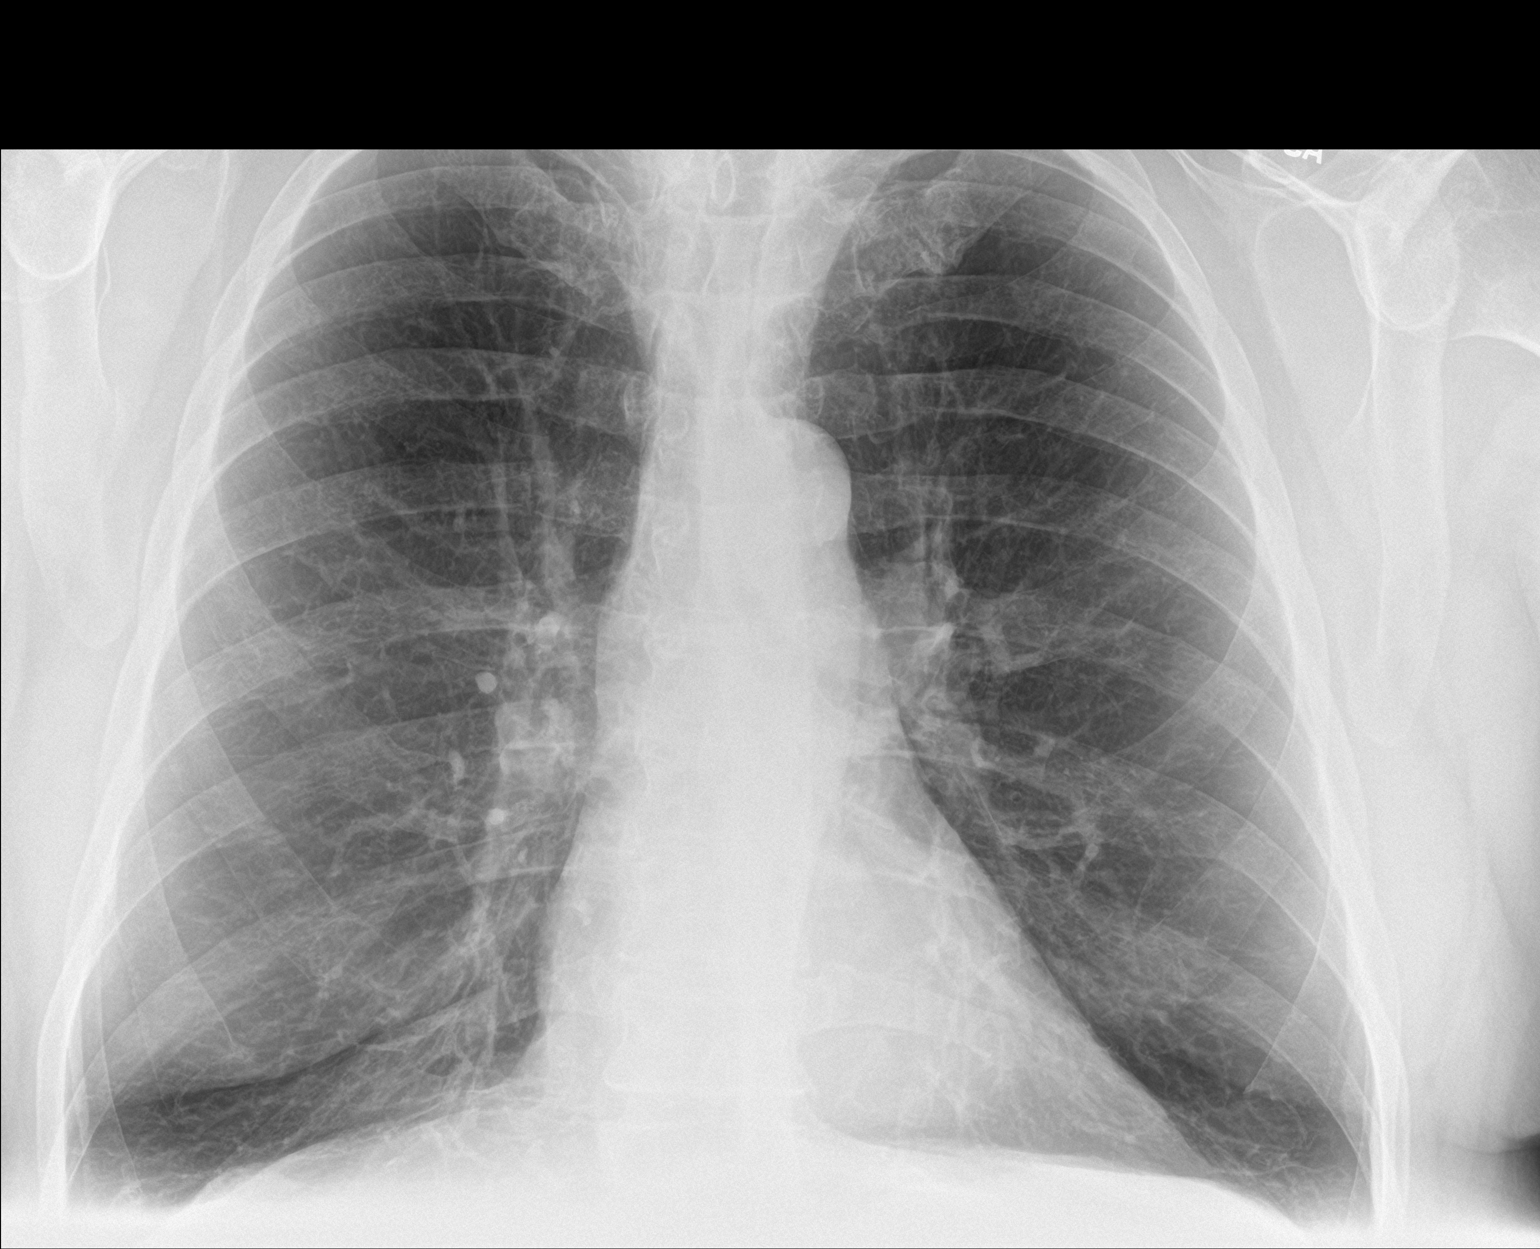

[chest lat]
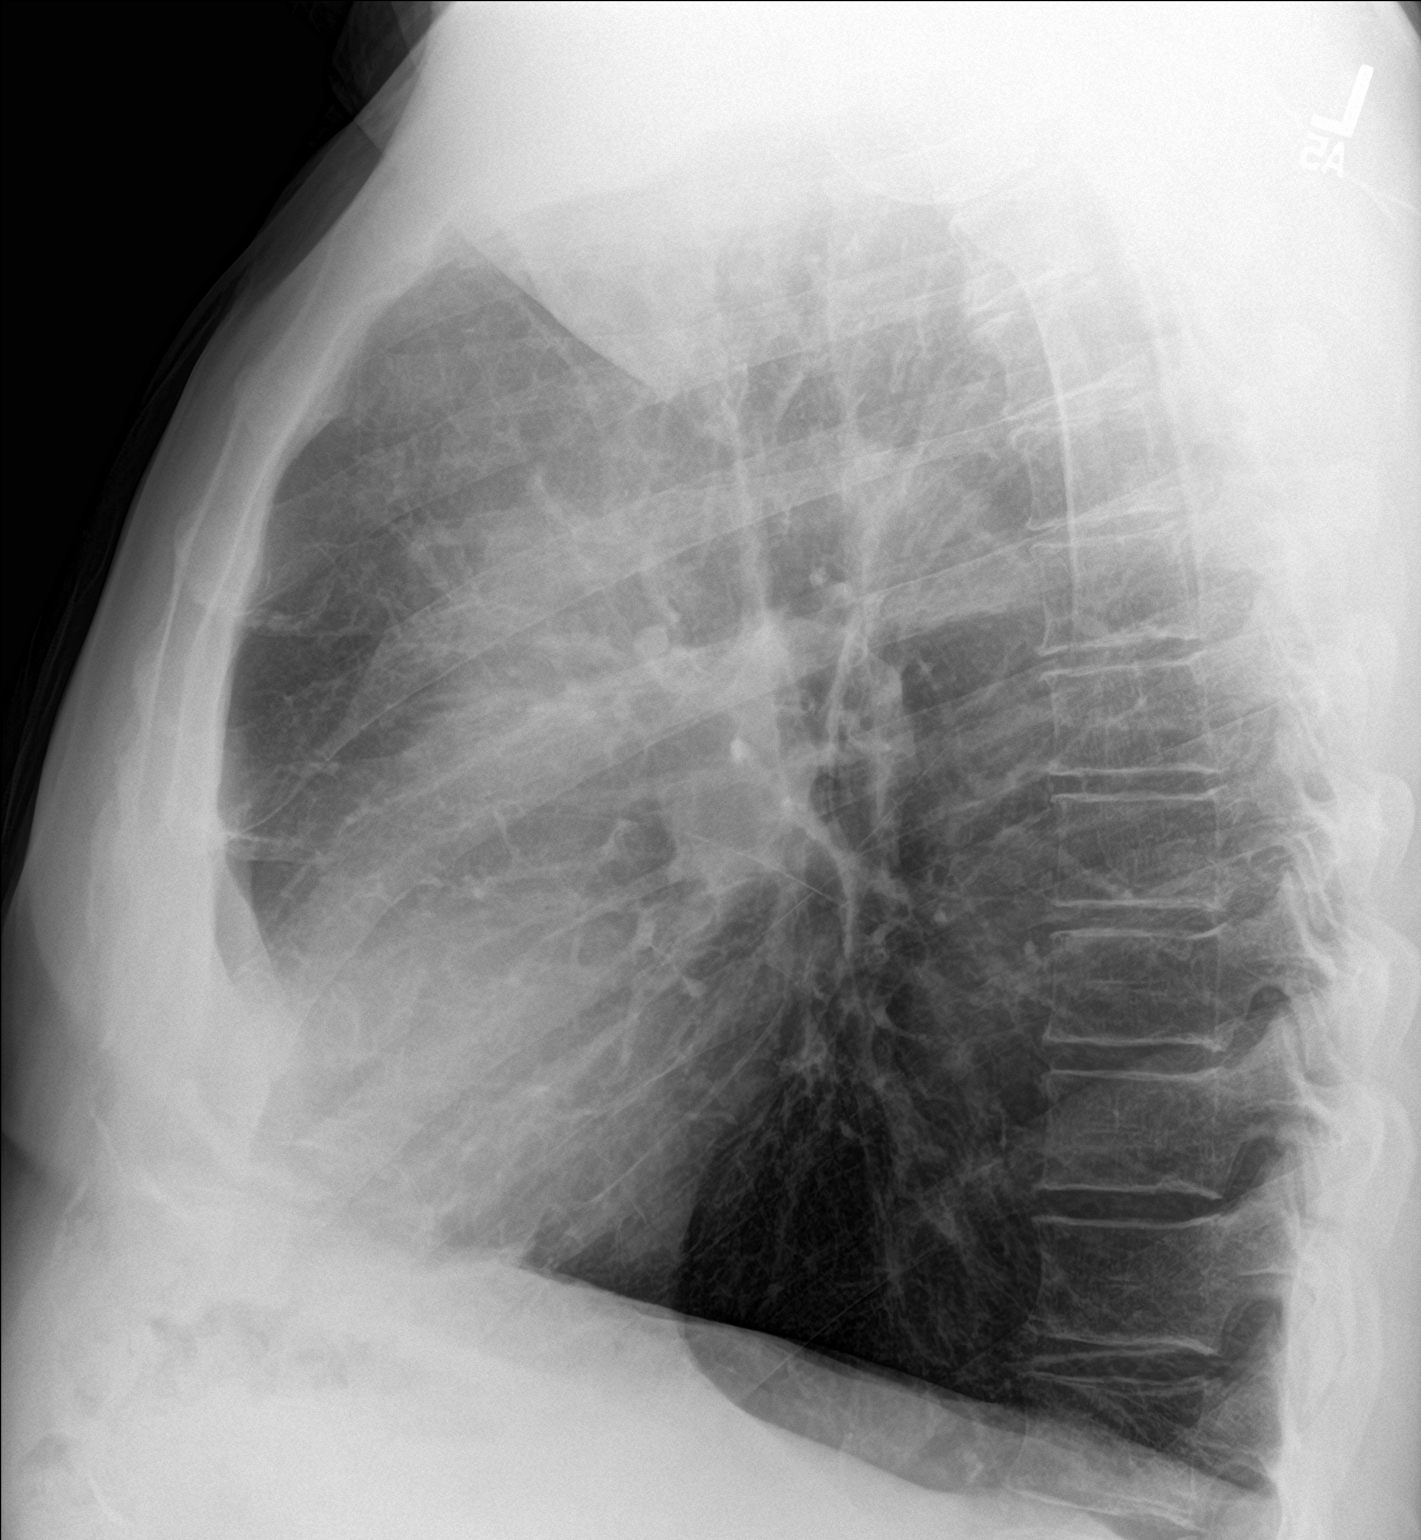

[2 of 2 positions shown; findings below may reference images not displayed]

FINDINGS: Normal sized heart. Clear lungs. Mildly progressive hyperexpansion
of the lungs with mild diffuse peribronchial thickening. Minimal
thoracic spine degenerative changes.
IMPRESSION: No acute abnormality. Mildly progressive changes of COPD and chronic
bronchitis.

## 2018-03-18 ENCOUNTER — Ambulatory Visit (HOSPITAL_BASED_OUTPATIENT_CLINIC_OR_DEPARTMENT_OTHER): Payer: Medicare HMO | Attending: Internal Medicine | Admitting: Internal Medicine

## 2018-03-18 VITALS — Ht 71.0 in | Wt 210.0 lb

## 2018-03-18 DIAGNOSIS — G4733 Obstructive sleep apnea (adult) (pediatric): Secondary | ICD-10-CM | POA: Diagnosis not present

## 2018-03-25 ENCOUNTER — Telehealth: Payer: Self-pay | Admitting: Internal Medicine

## 2018-03-25 DIAGNOSIS — G4733 Obstructive sleep apnea (adult) (pediatric): Secondary | ICD-10-CM

## 2018-03-25 NOTE — Telephone Encounter (Signed)
Called and spoke with patient, he is requesting results from sleep study.   CY please advise, thank you.

## 2018-04-01 NOTE — Telephone Encounter (Signed)
Spoke with wife Butch Penny per [employer]. Informed her that MD Annamaria Boots would be back in the office next week and we would follow up then.

## 2018-04-04 DIAGNOSIS — G4733 Obstructive sleep apnea (adult) (pediatric): Secondary | ICD-10-CM | POA: Diagnosis not present

## 2018-04-04 NOTE — Procedures (Signed)
   Patient Name: Kyle [name], Brander Date: 03/18/2018 Gender: Male D.O.B: 1945-12-15 Age (years): 30 Referring Provider: Baird Lyons MD, ABSM Height (inches): 71 Interpreting Physician: Baird Lyons MD, ABSM Weight (lbs): 210 RPSGT: Zadie Rhine BMI: 29 MRN: [id number] Neck Size: 17.00  CLINICAL INFORMATION The patient is referred for a BiPAP titration to treat sleep apnea. Date of NPSG, Split Night or HST:  HST 09/10/16   AHI 19/ hr, body weight 223 lbs  SLEEP STUDY TECHNIQUE As per the AASM Manual for the Scoring of Sleep and Associated Events v2.3 (April 2016) with a hypopnea requiring 4% desaturations.  The channels recorded and monitored were frontal, central and occipital EEG, electrooculogram (EOG), submentalis EMG (chin), nasal and oral airflow, thoracic and abdominal wall motion, anterior tibialis EMG, snore microphone, electrocardiogram, and pulse oximetry. Bilevel positive airway pressure (BPAP) was initiated at the beginning of the study and titrated to treat sleep-disordered breathing.  MEDICATIONS Medications self-administered by patient taken the night of the study : none reported  RESPIRATORY PARAMETERS Optimal IPAP Pressure (cm): 15 AHI at Optimal Pressure (/hr) 0.0 Optimal EPAP Pressure (cm): 11   Overall Minimal O2 (%): 84.0 Minimal O2 at Optimal Pressure (%): 90.0  SLEEP ARCHITECTURE Start Time: 9:37:53 PM Stop Time: 4:05:22 AM Total Time (min): 387.5 Total Sleep Time (min): 293 Sleep Latency (min): 9.4 Sleep Efficiency (%): 75.6% REM Latency (min): 111.5 WASO (min): 85.0 Stage N1 (%): 6.1% Stage N2 (%): 61.4% Stage N3 (%): 2.4% Stage R (%): 30 Supine (%): 23.83 Arousal Index (/hr): 35.0   CARDIAC DATA The 2 lead EKG demonstrated sinus rhythm. The mean heart rate was 62.6 beats per minute. Other EKG findings include: PVCs. LEG MOVEMENT DATA The total Periodic Limb Movements of Sleep (PLMS) were 0. The PLMS index was 0.0. A PLMS index of <15 is considered  normal in adults.  IMPRESSIONS - An optimal BiPAP pressure was selected for this patient ( 15 / 11 cm of water). CPAP had not provided adequate control at comfortable pressure. - Central sleep apnea was not noted during this titration (CAI = 0.2/h). - Oxygen desaturations were observed during this titration (min O2 = 84.0%). Minimal saturation at BIPAP 15/ 11 was 90%. - No snoring was audible during this study. - 2-lead EKG demonstrated: PVCs - Clinically significant periodic limb movements were not noted during this study. Arousals associated with PLMs were rare.  DIAGNOSIS - Obstructive Sleep Apnea (327.23 [G47.33 ICD-10])  RECOMMENDATIONS - Trial of BiPAP therapy on 15/11 cm H2O. Patient used a Medium size Fisher[employer]Paykel Full Face Mask Simplus mask and heated humidification. - Be careful with alcohol, sedatives and other CNS depressants that may worsen sleep apnea and disrupt normal sleep architecture. - Sleep hygiene should be reviewed to assess factors that may improve sleep quality. - Weight management and regular exercise should be initiated or continued.  [Electronically signed] 04/04/2018 10:39 AM  Baird Lyons MD, ABSM Diplomate, American Board of Sleep Medicine   NPI: [id number]                         [place], [facility]  ELECTRONICALLY SIGNED ON:  04/04/2018, 10:35 AM [place] PH: [phone]   FX: [phone] [place]

## 2018-04-05 NOTE — Telephone Encounter (Signed)
Spoke with patient, patient made aware of results per MD Young, voiced understanding, patient ok to move forward with order. Order placed. Requested that patient data be transferred to [facility] office per patient request instead of [place]. Nothing further needed at this time.

## 2018-04-05 NOTE — Telephone Encounter (Signed)
Dr. Annamaria Boots please advise on patients sleep study results, thank you.

## 2018-04-05 NOTE — Telephone Encounter (Signed)
His CPAP/ BIPAP titration sleep study showed that CPAP wasn't controlling him well, and he did better with BIPAP.  Please order DME [facility]- change to BIPAP 15/ 11, PS 3, continue mask of choice, humidifier, supplies, AirView

## 2018-04-18 DIAGNOSIS — G4733 Obstructive sleep apnea (adult) (pediatric): Secondary | ICD-10-CM | POA: Diagnosis not present

## 2018-04-19 ENCOUNTER — Other Ambulatory Visit: Payer: Self-pay

## 2018-04-19 ENCOUNTER — Ambulatory Visit (INDEPENDENT_AMBULATORY_CARE_PROVIDER_SITE_OTHER): Payer: Medicare HMO | Admitting: Family Medicine

## 2018-04-19 ENCOUNTER — Encounter: Payer: Self-pay | Admitting: Family Medicine

## 2018-04-19 VITALS — BP 134/78 | HR 71 | Temp 97.9°F | Resp 17 | Ht 68.25 in | Wt 215.7 lb

## 2018-04-19 DIAGNOSIS — Z0000 Encounter for general adult medical examination without abnormal findings: Secondary | ICD-10-CM

## 2018-04-19 DIAGNOSIS — Z23 Encounter for immunization: Secondary | ICD-10-CM | POA: Diagnosis not present

## 2018-04-19 LAB — HEPATIC FUNCTION PANEL
ALT: 20 U/L (ref 0–53)
AST: 21 U/L (ref 0–37)
Albumin: 4.2 g/dL (ref 3.5–5.2)
Alkaline Phosphatase: 71 U/L (ref 39–117)
BILIRUBIN DIRECT: 0.1 mg/dL (ref 0.0–0.3)
BILIRUBIN TOTAL: 0.5 mg/dL (ref 0.2–1.2)
TOTAL PROTEIN: 7 g/dL (ref 6.0–8.3)

## 2018-04-19 LAB — LIPID PANEL
CHOL/HDL RATIO: 4
Cholesterol: 96 mg/dL (ref 0–200)
HDL: 24.6 mg/dL — ABNORMAL LOW (ref >39.00)
LDL Cholesterol: 56 mg/dL (ref 0–99)
NONHDL: 71.25
Triglycerides: 78 mg/dL (ref 0.0–149.0)
VLDL: 15.6 mg/dL (ref 0.0–40.0)

## 2018-04-19 LAB — BASIC METABOLIC PANEL
BUN: 28 mg/dL — AB (ref 6–23)
CALCIUM: 9.7 mg/dL (ref 8.4–10.5)
CO2: 28 mEq/L (ref 19–32)
CREATININE: 0.96 mg/dL (ref 0.40–1.50)
Chloride: 103 mEq/L (ref 96–112)
GFR: 81.99 mL/min (ref >60.00)
GLUCOSE: 110 mg/dL — AB (ref 70–99)
Potassium: 4.4 mEq/L (ref 3.5–5.1)
SODIUM: 140 meq/L (ref 135–145)

## 2018-04-19 LAB — TSH: TSH: 0.12 u[IU]/mL — ABNORMAL LOW (ref 0.35–4.50)

## 2018-04-19 LAB — CBC WITH DIFFERENTIAL/PLATELET
BASOS ABS: 0 10*3/uL (ref 0.0–0.1)
Basophils Relative: 1 % (ref 0.0–3.0)
EOS PCT: 5.6 % — AB (ref 0.0–5.0)
Eosinophils Absolute: 0.3 10*3/uL (ref 0.0–0.7)
HCT: 33 % — ABNORMAL LOW (ref 39.0–52.0)
HEMOGLOBIN: 10.5 g/dL — AB (ref 13.0–17.0)
LYMPHS PCT: 22.7 % (ref 12.0–46.0)
Lymphs Abs: 1.1 10*3/uL (ref 0.7–4.0)
MCHC: 32 g/dL (ref 30.0–36.0)
MCV: 82 fl (ref 78.0–100.0)
MONOS PCT: 13.3 % — AB (ref 3.0–12.0)
Monocytes Absolute: 0.7 10*3/uL (ref 0.1–1.0)
NEUTROS PCT: 57.4 % (ref 43.0–77.0)
Neutro Abs: 2.8 10*3/uL (ref 1.4–7.7)
PLATELETS: 223 10*3/uL (ref 150.0–400.0)
RBC: 4.02 Mil/uL — AB (ref 4.22–5.81)
RDW: 15.4 % (ref 11.5–15.5)
WBC: 4.9 10*3/uL (ref 4.0–10.5)

## 2018-04-19 LAB — PSA: PSA: 0.44 ng/mL (ref 0.10–4.00)

## 2018-04-19 NOTE — Progress Notes (Signed)
Subjective:     Patient ID: Kyle [name], male   DOB: March 20, 1946, 72 y.o.   MRN: [id number]  HPI Patient seen for complete physical. His chronic problems include history of CAD, obstructive sleep apnea, mixed type COPD, hypertension, hypothyroidism, hyperlipidemia, obesity, and mild normocytic anemia.  He's had previous shingles vaccine and has questions about newer shingles vaccine. He's also had clinical shingles a few years ago. His colonoscopy is up-to-date. No history of hepatitis C screening. Still needs flu vaccine. He's had Pneumovax and Prevnar.  Main issue is some gradual progression of dyspnea with activity related to his COPD. No chest pains. He takes combination inhaler-LAMA + LABA.  Followed by pulmonary.  Over 30 pack year history of smoking but quit about 20 years ago.  Past Medical History:  Diagnosis Date  . CAD (coronary artery disease) 2011  . Cardiac arrhythmia   . COPD (chronic obstructive pulmonary disease) ([place])   . GERD (gastroesophageal reflux disease)   . Hyperlipidemia   . Hypertension   . Myocardial infarction [facility]) 2011   Past Surgical History:  Procedure Laterality Date  . APPENDECTOMY    . CARDIAC CATHETERIZATION N/A 05/05/2016   Procedure: Left Heart Cath and Coronary Angiography;  Surgeon: Jerline Pain, MD;  Location: [place] [facility];  Service: Cardiovascular;  Laterality: N/A;  . CORONARY ANGIOPLASTY WITH STENT PLACEMENT    . EYE SURGERY     lasix /BIL    reports that he quit smoking about 24 years ago. His smoking use included cigarettes. He has a 112.00 pack-year smoking history. He has never used smokeless tobacco. He reports that he drinks about 5.0 standard drinks of alcohol per week. He reports that he does not use drugs. family history includes Multiple sclerosis in his sister; Stroke in his father and mother. No Known Allergies   Review of Systems  Constitutional: Negative for activity change, appetite change, fatigue, fever and  unexpected weight change.  HENT: Negative for congestion, ear pain and trouble swallowing.   Eyes: Negative for pain and visual disturbance.  Respiratory: Positive for shortness of breath. Negative for cough and wheezing.   Cardiovascular: Negative for chest pain and palpitations.  Gastrointestinal: Negative for abdominal distention, abdominal pain, blood in stool, constipation, diarrhea, nausea, rectal pain and vomiting.  Endocrine: Negative for polydipsia and polyuria.  Genitourinary: Negative for dysuria, hematuria and testicular pain.  Musculoskeletal: Negative for arthralgias and joint swelling.  Skin: Negative for rash.  Neurological: Negative for dizziness, syncope and headaches.  Hematological: Negative for adenopathy.  Psychiatric/Behavioral: Negative for confusion and dysphoric mood.       Objective:   Physical Exam  Constitutional: He is oriented to person, place, and time. He appears well-developed and well-nourished. No distress.  HENT:  Head: Normocephalic and atraumatic.  Right Ear: External ear normal.  Left Ear: External ear normal.  Mouth/Throat: Oropharynx is clear and moist.  Eyes: Pupils are equal, round, and reactive to light. Conjunctivae and EOM are normal.  Neck: Normal range of motion. Neck supple. No thyromegaly present.  Cardiovascular: Normal rate, regular rhythm and normal heart sounds.  No murmur heard. Pulmonary/Chest: No respiratory distress. He has no wheezes. He has no rales.  Abdominal: Soft. Bowel sounds are normal. He exhibits no distension and no mass. There is no tenderness. There is no rebound and no guarding.  Musculoskeletal: He exhibits no edema.  Lymphadenopathy:    He has no cervical adenopathy.  Neurological: He is alert and oriented to person, place,  and time. He displays normal reflexes. No cranial nerve deficit.  Skin: No rash noted.  Multiple scattered seborrheic keratoses. No concerning skin lesions.  Psychiatric: He has a normal  mood and affect.       Assessment:     Physical exam. Following health maintenance issues were addressed    Plan:     -flu vaccine given -He will check on coverage for shingles vaccine -Colonoscopy up-to-date -Check hepatitis C antibody -Check screening labs -The natural history of prostate cancer and ongoing controversy regarding screening and potential treatment outcomes of prostate cancer has been discussed with the patient. The meaning of a false positive PSA and a false negative PSA has been discussed. He indicates understanding of the limitations of this screening test and wishes to proceed with screening PSA testing. -He is not a candidate for low-dose CT lung cancer screening by virtue of having quit smoking over 20 years ago  Eulas Post MD [facility] at [facility]

## 2018-04-19 NOTE — Patient Instructions (Signed)
I would like for you you to schedule a Medicare Annual Wellness Visit (AWV).   This is a yearly appointment with our Health Coach Wynetta Fines, RN) and is designed to develop a personalized prevention plan. This is not a head to toe physical, but rather an opportunity to prevent illness based on your current health and risk factors for disease.   Visits usually last 30-60 minutes and include various screenings for hearing, vision, depression, and dementia, falls, and safety concerns. The visit also includes diet and exercise counseling and information about advance directives.   This is also an opportunity to discuss appropriate health maintenance testing such as mammography, colonoscopy, lung cancer screening, and hepatitis C testing.   The AWV is fully covered by Medicare Part B if:  . You have had Part B for over 12 months, AND . You have not had an AWV in the past 12 months .  Consider Shingrix vaccine and check on coverage.

## 2018-04-20 LAB — HEPATITIS C ANTIBODY
HEP C AB: NONREACTIVE
SIGNAL TO CUT-OFF: 0.05 (ref <1.00)

## 2018-04-21 ENCOUNTER — Telehealth: Payer: Self-pay

## 2018-04-21 NOTE — Telephone Encounter (Signed)
Pt. Given results and instructions. Scheduled for Ferritin,B12,TIBC/serum iron as ordered. Scheduled for repeat TSH in 3 months.

## 2018-04-22 ENCOUNTER — Other Ambulatory Visit: Payer: Self-pay

## 2018-04-22 DIAGNOSIS — D619 Aplastic anemia, unspecified: Secondary | ICD-10-CM

## 2018-04-22 DIAGNOSIS — E039 Hypothyroidism, unspecified: Secondary | ICD-10-CM

## 2018-04-22 DIAGNOSIS — D649 Anemia, unspecified: Secondary | ICD-10-CM

## 2018-04-22 NOTE — Telephone Encounter (Signed)
Labs have been ordered

## 2018-04-25 ENCOUNTER — Other Ambulatory Visit: Payer: Medicare HMO

## 2018-05-18 DIAGNOSIS — G4733 Obstructive sleep apnea (adult) (pediatric): Secondary | ICD-10-CM | POA: Diagnosis not present

## 2018-05-19 DIAGNOSIS — G4733 Obstructive sleep apnea (adult) (pediatric): Secondary | ICD-10-CM | POA: Diagnosis not present

## 2018-05-22 ENCOUNTER — Encounter: Payer: Self-pay | Admitting: Internal Medicine

## 2018-05-23 ENCOUNTER — Ambulatory Visit: Payer: Medicare HMO | Admitting: Internal Medicine

## 2018-05-23 ENCOUNTER — Encounter: Payer: Self-pay | Admitting: Internal Medicine

## 2018-05-23 DIAGNOSIS — J449 Chronic obstructive pulmonary disease, unspecified: Secondary | ICD-10-CM | POA: Diagnosis not present

## 2018-05-23 DIAGNOSIS — G4733 Obstructive sleep apnea (adult) (pediatric): Secondary | ICD-10-CM

## 2018-05-23 MED ORDER — FLUTICASONE-UMECLIDIN-VILANT 100-62.5-25 MCG/INH IN AEPB: 1.0000 | INHALATION_SPRAY | Freq: Every day | RESPIRATORY_TRACT | 0 refills | Status: DC

## 2018-05-23 NOTE — Progress Notes (Signed)
Subjective:    Patient ID: Kyle [name], male    DOB: Jan 07, 1946, 72 y.o.   MRN: [id number]  HPI   male former smoker followed for OSA, COPD GOLD III, Rhinitis Complicated by GERD, CAD/MI, HBP Unattended Home Sleep Test-01/05/2014, moderate OSA, AHI 19/hour, weight 223 pounds Office Spirometry 09/10/2016-severe obstructive airways disease: FVC 2.83/61%, FEV1 1.17/34%, ratio 0.41, FEF 25-75% 0.35/13% PFT 09/22/16-severe COPD, no response to dilator, diffusion moderately reduced, air trapping, hyperinflation. FVC 3.54/75%, FEV1 1.7 2/49%, ratio 0.49, TLC 123%, DLCO 56% ----------------------------------------------------------------- 02/18/2018- 72 year old male former smoker followed for OSA, COPD, rhinitis complicated by GERD, CAD/MI, HBP CPAP auto 5-12/[employer]  -----FOLLOWS FOR: pt wearing cpap avg 6-7hr nightly. concerned about AHI- feels machine is blowing too strong.  Download 90% compliance AHI 20/hour, leak 120 He has been comfortable with his CPAP until recent months.  Now wife says he snores loudly through the machine at they are in separate bedrooms.  He has lost 20 pounds, walking 3 miles daily at the gym.  DME company felt machine was working mechanically correctly but they have given him a replacement to try.  05/23/2018-  72 year old male former smoker followed for OSA, COPD, rhinitis complicated by GERD, CAD/MI, HBP -----Follows For: OSA, pt reports he is doing well on Cpap BiPAP titration sleep study 03/18/2018- Titrated to 15/ 11,  BIPAP 15/11/ PS 3 ([employer]) Download 100% compliance AHI 4.5/hour Wife says he does not snore at all using his BiPAP and he thinks he sleeps very well with it.  He is going to gym and playing golf regularly. Has not been using a maintenance bronchodilator but is using his rescue inhaler about 3 times a day-discussed.  ROS-see HPI   + = positive Constitutional:    weight loss, night sweats, fevers, chills, fatigue,  lassitude. HEENT:    headaches, difficulty swallowing, tooth/dental problems, sore throat,       sneezing, itching, ear ache, nasal congestion, post nasal drip, snoring CV:    chest pain, orthopnea, PND, swelling in lower extremities, anasarca,                                                           dizziness, palpitations Resp:   +shortness of breath with exertion or at rest.                productive cough,   non-productive cough, coughing up of blood.              change in color of mucus.  +wheezing.   Skin:    rash or lesions. GI:  No-   heartburn, indigestion, abdominal pain, nausea, vomiting, diarrhea,                 change in bowel habits, loss of appetite GU: dysuria, change in color of urine, no urgency or frequency.   flank pain. MS:   joint pain, stiffness, decreased range of motion, back pain. Neuro-     nothing unusual Psych:  change in mood or affect.  depression or anxiety.   memory loss.     Objective:  OBJ- Physical Exam General- Alert, Oriented, Affect-appropriate, Distress- none acute, still overweight Skin- rash-none, lesions- none, excoriation- none Lymphadenopathy- none Head- atraumatic  Eyes- Gross vision intact, PERRLA, conjunctivae and secretions clear            Ears- Hearing, canals-normal            Nose- Clear, no-Septal dev, mucus, polyps, erosion, perforation,              Throat- Mallampati III , mucosa clear , drainage- none, tonsils- atrophic Neck- flexible , trachea midline, no stridor , thyroid nl, carotid no bruit Chest - symmetrical excursion , unlabored           Heart/CV- RRR , no murmur , no gallop  , no rub, nl s1 s2                           - JVD- none , edema- none, stasis changes- none, varices- none           Lung- + unlabored, Clear, wheeze- none, cough- none , dullness-none, rub- none           Chest wall-  Abd-  Br/ Gen/ Rectal- Not done, not indicated Extrem- cyanosis- none, clubbing, none, atrophy- none, strength-  nl Neuro- grossly intact to observation  Assessment & Plan:

## 2018-05-23 NOTE — Patient Instructions (Signed)
We have taken Bevspi off your list since you aren't using it.  Sample Trelegy inhaler    Inhale 1 puff, then rinse mouth, once daily. This is a maintenance inhaler. See if using it daily reduces how often you use the rescue inhaler. If Trelegy is a real help, we can give you a prescription.  We can continue BIPAP 15/11, PS 3, mask of choice, humidifier, supplies AirView  Please call if we can help

## 2018-05-25 NOTE — Assessment & Plan Note (Signed)
He benefits from CPAP with improved sleep and prevention of snoring.  Download confirms good compliance and control. Plan-continue BIPAP 15/11, PS3

## 2018-05-25 NOTE — Assessment & Plan Note (Signed)
We discussed goals of bronchodilator therapy and distinction between maintenance and rescue. Plan-instead of Bevespi try Trelegy

## 2018-06-06 DIAGNOSIS — G4733 Obstructive sleep apnea (adult) (pediatric): Secondary | ICD-10-CM | POA: Diagnosis not present

## 2018-06-10 ENCOUNTER — Ambulatory Visit: Payer: Medicare HMO | Admitting: Internal Medicine

## 2018-06-18 DIAGNOSIS — G4733 Obstructive sleep apnea (adult) (pediatric): Secondary | ICD-10-CM | POA: Diagnosis not present

## 2018-06-20 DIAGNOSIS — G4733 Obstructive sleep apnea (adult) (pediatric): Secondary | ICD-10-CM | POA: Diagnosis not present

## 2018-07-18 DIAGNOSIS — G4733 Obstructive sleep apnea (adult) (pediatric): Secondary | ICD-10-CM | POA: Diagnosis not present

## 2018-07-21 ENCOUNTER — Other Ambulatory Visit (INDEPENDENT_AMBULATORY_CARE_PROVIDER_SITE_OTHER): Payer: Medicare HMO

## 2018-07-21 DIAGNOSIS — E039 Hypothyroidism, unspecified: Secondary | ICD-10-CM

## 2018-07-21 LAB — TSH: TSH: 0.03 u[IU]/mL — ABNORMAL LOW (ref 0.35–4.50)

## 2018-07-25 ENCOUNTER — Other Ambulatory Visit: Payer: Self-pay | Admitting: Cardiology

## 2018-07-25 ENCOUNTER — Other Ambulatory Visit: Payer: Self-pay

## 2018-07-25 DIAGNOSIS — E039 Hypothyroidism, unspecified: Secondary | ICD-10-CM

## 2018-07-25 MED ORDER — LEVOTHYROXINE SODIUM 50 MCG PO TABS: 50.0000 ug | ORAL_TABLET | Freq: Every day | ORAL | 3 refills | Status: DC

## 2018-08-18 DIAGNOSIS — G4733 Obstructive sleep apnea (adult) (pediatric): Secondary | ICD-10-CM | POA: Diagnosis not present

## 2018-08-23 ENCOUNTER — Ambulatory Visit: Payer: Medicare HMO | Admitting: Cardiology

## 2018-08-24 ENCOUNTER — Emergency Department (HOSPITAL_BASED_OUTPATIENT_CLINIC_OR_DEPARTMENT_OTHER)
Admission: EM | Admit: 2018-08-24 | Discharge: 2018-08-24 | Disposition: A | Discharge status: Home / Self Care | Payer: Medicare HMO | Attending: Emergency Medicine | Admitting: Emergency Medicine

## 2018-08-24 ENCOUNTER — Other Ambulatory Visit: Payer: Self-pay

## 2018-08-24 ENCOUNTER — Encounter (HOSPITAL_BASED_OUTPATIENT_CLINIC_OR_DEPARTMENT_OTHER): Payer: Self-pay | Admitting: *Deleted

## 2018-08-24 DIAGNOSIS — Z7982 Long term (current) use of aspirin: Secondary | ICD-10-CM | POA: Insufficient documentation

## 2018-08-24 DIAGNOSIS — I10 Essential (primary) hypertension: Secondary | ICD-10-CM | POA: Diagnosis not present

## 2018-08-24 DIAGNOSIS — S61202A Unspecified open wound of right middle finger without damage to nail, initial encounter: Secondary | ICD-10-CM | POA: Insufficient documentation

## 2018-08-24 DIAGNOSIS — E039 Hypothyroidism, unspecified: Secondary | ICD-10-CM | POA: Diagnosis not present

## 2018-08-24 DIAGNOSIS — I2510 Atherosclerotic heart disease of native coronary artery without angina pectoris: Secondary | ICD-10-CM | POA: Diagnosis not present

## 2018-08-24 DIAGNOSIS — Y92009 Unspecified place in unspecified non-institutional (private) residence as the place of occurrence of the external cause: Secondary | ICD-10-CM | POA: Diagnosis not present

## 2018-08-24 DIAGNOSIS — S61212A Laceration without foreign body of right middle finger without damage to nail, initial encounter: Secondary | ICD-10-CM | POA: Diagnosis not present

## 2018-08-24 DIAGNOSIS — Z23 Encounter for immunization: Secondary | ICD-10-CM | POA: Insufficient documentation

## 2018-08-24 DIAGNOSIS — Z79899 Other long term (current) drug therapy: Secondary | ICD-10-CM | POA: Insufficient documentation

## 2018-08-24 DIAGNOSIS — Y999 Unspecified external cause status: Secondary | ICD-10-CM | POA: Insufficient documentation

## 2018-08-24 DIAGNOSIS — Y93G1 Activity, food preparation and clean up: Secondary | ICD-10-CM | POA: Insufficient documentation

## 2018-08-24 DIAGNOSIS — Z87891 Personal history of nicotine dependence: Secondary | ICD-10-CM | POA: Diagnosis not present

## 2018-08-24 DIAGNOSIS — I252 Old myocardial infarction: Secondary | ICD-10-CM | POA: Diagnosis not present

## 2018-08-24 DIAGNOSIS — W268XXA Contact with other sharp object(s), not elsewhere classified, initial encounter: Secondary | ICD-10-CM | POA: Insufficient documentation

## 2018-08-24 MED ORDER — TETANUS-DIPHTH-ACELL PERTUSSIS 5-2.5-18.5 LF-MCG/0.5 IM SUSP
0.5000 mL | Freq: Once | INTRAMUSCULAR | Status: AC
  Administered 2018-08-24: 0.5 mL via INTRAMUSCULAR
  Filled 2018-08-24: qty 0.5

## 2018-08-24 NOTE — ED Triage Notes (Signed)
Pt cut R middle finger on a food processor.

## 2018-08-24 NOTE — Discharge Instructions (Signed)
Keep dressing in place for the next 24 hours at least after that you may shower but do not submerge the finger underwater and you can change dressing once daily with antibiotic ointment.  Monitor the area closely for signs of infection such as redness, swelling, increasing pain, warmth or drainage if this occurs return to the emergency department.  I would like for you to follow-up with your primary care doctor for a wound check in the next 3 to 4 days.  Your tetanus was updated today and is good for the next 10 years.

## 2018-08-24 NOTE — ED Provider Notes (Signed)
[facility] EMERGENCY DEPARTMENT Provider Note   CSN: [id number] Arrival date & time: 08/24/18  1144     History   Chief Complaint Chief Complaint  Patient presents with  . Finger Injury    HPI Kyle [name] is a 73 y.o. male.  Kyle [name] is a 73 y.o. male with a history of CAD, COPD, hypertension, hyperlipidemia, and GERD, who presents to the emergency department for evaluation of laceration to the palmar surface of the right middle finger.  He reports that just prior to arrival he was cutting vegetables with a mandolin and accidentally sliced away a portion of his finger pad.  He reports he was unable to get the bleeding under control at home.  He takes a daily baby aspirin but is not on any other blood thinners.  No damage to the nail, denies any numbness tingling or weakness in the finger has normal range of motion.  Unsure of last tetanus vaccination.     Past Medical History:  Diagnosis Date  . CAD (coronary artery disease) 2011  . Cardiac arrhythmia   . COPD (chronic obstructive pulmonary disease) ([place])   . GERD (gastroesophageal reflux disease)   . Hyperlipidemia   . Hypertension   . Myocardial infarction [facility]) 2011    Patient Active Problem List   Diagnosis Date Noted  . COPD mixed type ([place]) 09/15/2016  . Unstable angina ([place])   . Hypothyroidism 06/25/2014  . Normocytic anemia 12/14/2013  . Obstructive sleep apnea 11/01/2013  . Obesity (BMI 30-39.9) 06/16/2013  . Prediabetes 06/16/2013  . Hypertension 12/11/2011  . Hyperlipidemia 12/11/2011  . CAD (coronary artery disease) 12/11/2011    Past Surgical History:  Procedure Laterality Date  . APPENDECTOMY    . CARDIAC CATHETERIZATION N/A 05/05/2016   Procedure: Left Heart Cath and Coronary Angiography;  Surgeon: Jerline Pain, MD;  Location: [facility] [facility];  Service: Cardiovascular;  Laterality: N/A;  . CORONARY ANGIOPLASTY WITH STENT PLACEMENT    . EYE SURGERY     lasix /BIL         Home Medications    Prior to Admission medications   Medication Sig Start Date End Date Taking? Authorizing Provider  albuterol (PROVENTIL HFA;VENTOLIN HFA) 108 (90 Base) MCG/ACT inhaler Inhale 2 puffs into the lungs every 6 (six) hours as needed for wheezing or shortness of breath. 10/08/17   Burchette, Alinda Sierras, MD  aspirin 81 MG tablet Take 81 mg by mouth daily.    [provider]  atorvastatin (LIPITOR) 40 MG tablet TAKE ONE TABLET BY MOUTH DAILY AT 6PM - 09/15/17   Burchette, Alinda Sierras, MD  benazepril-hydrochlorthiazide (LOTENSIN HCT) 20-25 MG tablet TAKE ONE-HALF TABLET BY MOUTH DAILY 07/25/18   Jerline Pain, MD  cetirizine (ZYRTEC) 10 MG tablet Take 10 mg by mouth daily.    [provider]  Cyanocobalamin (B-12 PO) Take 1 tablet by mouth daily.    [provider]  Fluticasone-Umeclidin-Vilant (TRELEGY ELLIPTA) 100-62.5-25 MCG/INH AEPB Inhale 1 puff into the lungs daily. 05/23/18   Baird Lyons D, MD  levothyroxine (SYNTHROID, LEVOTHROID) 50 MCG tablet Take 1 tablet (50 mcg total) by mouth daily. 07/25/18   Burchette, Alinda Sierras, MD  Multiple Vitamin (MULTIVITAMIN) tablet Take 1 tablet by mouth daily.    [provider]  omeprazole (PRILOSEC OTC) 20 MG tablet Take 20 mg by mouth daily as needed (heartburn).     [provider]  valACYclovir (VALTREX) 1000 MG tablet 1 g. 08/27/17  [provider]    Family History Family History  Problem Relation Age of Onset  . Stroke Mother   . Stroke Father   . Multiple sclerosis Sister     Social History Social History   Tobacco Use  . Smoking status: Former Smoker    Packs/day: 4.00    Years: 28.00    Pack years: 112.00    Types: Cigarettes    Last attempt to quit: 12/10/1993    Years since quitting: 24.7  . Smokeless tobacco: Never Used  Substance Use Topics  . Alcohol use: Yes    Alcohol/week: 5.0 standard drinks    Types: 5 Standard drinks or equivalent per week  . Drug  use: No     Allergies   Patient has no known allergies.   Review of Systems Review of Systems  Constitutional: Negative for chills and fever.  Musculoskeletal: Negative for arthralgias and joint swelling.  Skin: Positive for wound.  Neurological: Negative for weakness and numbness.     Physical Exam Updated Vital Signs BP (!) 177/84 (BP Location: Left Arm)   Pulse 85   Temp 98.1 F (36.7 C) (Oral)   Resp 18   Ht 5\' 11"  (1.803 m)   Wt (!) 223.1 kg   SpO2 98%   BMI 68.60 kg/m   Physical Exam Vitals signs and nursing note reviewed.  Constitutional:      General: He is not in acute distress.    Appearance: He is well-developed. He is not diaphoretic.  HENT:     Head: Normocephalic and atraumatic.  Eyes:     General:        Right eye: No discharge.        Left eye: No discharge.  Pulmonary:     Effort: Pulmonary effort is normal. No respiratory distress.  Musculoskeletal:     Comments: 1 x 1 cm laceration of the anger pad of the right middle finger from mandolin injury, actively bleeding, no repairable laceration, no damage to the nail, normal range of motion at all joints, normal sensation and strength and good capillary refill with 2+ radial pulse.  Skin:    General: Skin is warm and dry.     Capillary Refill: Capillary refill takes less than 2 seconds.  Neurological:     Mental Status: He is alert.     Coordination: Coordination normal.  Psychiatric:        Mood and Affect: Mood normal.        Behavior: Behavior normal.      ED Treatments / Results  Labs (all labs ordered are listed, but only abnormal results are displayed) Labs Reviewed - No data to display  EKG None  Radiology No results found.  Procedures Procedures (including critical care time)  Medications Ordered in ED Medications  Tdap (BOOSTRIX) injection 0.5 mL (0.5 mLs Intramuscular Given 08/24/18 1231)     Initial Impression / Assessment and Plan / ED Course  I have reviewed the  triage vital signs and the nursing notes.  Pertinent labs & imaging results that were available during my care of the patient were reviewed by me and considered in my medical decision making (see chart for details).  Patient presents with mandolin injury to the right middle finger which occurred just prior to arrival.  Tetanus updated.  There is no repairable laceration but portion of the finger pad has been cut away and is actively bleeding.  Wound seal powder applied and Coban dressing in place.  This has controlled bleeding well.  Patient monitored in the department for 30 minutes after dressing was applied with no further bleeding through the dressing.  Will have patient keep dressing in place for a minimum of 24 hours after that we will have him change dressing daily, discussed appropriate wound care and signs of infection that should warrant sooner return.  Patient to have follow-up appointment with PCP for wound check in the next 4 to 5 days.  Strict return precautions discussed.  Patient expresses understanding and agreement with plan.  Discharged home in good condition.  Final Clinical Impressions(s) / ED Diagnoses   Final diagnoses:  Laceration of right middle finger without foreign body without damage to nail, initial encounter    ED Discharge Orders    None       Janet Berlin 08/24/18 1334    Little, Wenda Overland, MD 08/27/18 [id number]

## 2018-08-26 ENCOUNTER — Encounter: Payer: Self-pay | Admitting: Family Medicine

## 2018-08-26 ENCOUNTER — Other Ambulatory Visit: Payer: Self-pay

## 2018-08-26 ENCOUNTER — Ambulatory Visit (INDEPENDENT_AMBULATORY_CARE_PROVIDER_SITE_OTHER): Payer: Medicare HMO | Admitting: Family Medicine

## 2018-08-26 VITALS — BP 130/80 | HR 85 | Temp 97.7°F | Ht 71.0 in | Wt 221.5 lb

## 2018-08-26 DIAGNOSIS — S61209A Unspecified open wound of unspecified finger without damage to nail, initial encounter: Secondary | ICD-10-CM

## 2018-08-26 NOTE — Progress Notes (Signed)
  Subjective:     Patient ID: Kyle [name], male   DOB: 05/02/1946, 73 y.o.   MRN: [id number]  HPI Patient seen for ER follow-up.  2 days ago he was doing some food prep with a food processor and was feeding an onion into the processor and cut his right middle finger.  He took off the distal pulp.  He had prolonged bleeding and went to the ER for further evaluation.  There is no indication for suturing.  No bony involvement.  No nail involvement.  Patient was given some WoundSeal-and apply that a couple of times it has had no bleeding since then.  He had tetanus booster.  He has not been consistently dressing or wrapping this.  He has no pain or other complaints at this time  Past Medical History:  Diagnosis Date  . CAD (coronary artery disease) 2011  . Cardiac arrhythmia   . COPD (chronic obstructive pulmonary disease) ([place])   . GERD (gastroesophageal reflux disease)   . Hyperlipidemia   . Hypertension   . Myocardial infarction [facility]) 2011   Past Surgical History:  Procedure Laterality Date  . APPENDECTOMY    . CARDIAC CATHETERIZATION N/A 05/05/2016   Procedure: Left Heart Cath and Coronary Angiography;  Surgeon: Jerline Pain, MD;  Location: [facility];  Service: Cardiovascular;  Laterality: N/A;  . CORONARY ANGIOPLASTY WITH STENT PLACEMENT    . EYE SURGERY     lasix /BIL    reports that he quit smoking about 24 years ago. His smoking use included cigarettes. He has a 112.00 pack-year smoking history. He has never used smokeless tobacco. He reports current alcohol use of about 5.0 standard drinks of alcohol per week. He reports that he does not use drugs. family history includes Multiple sclerosis in his sister; Stroke in his father and mother. No Known Allergies   Review of Systems  Constitutional: Negative for chills and fever.       Objective:   Physical Exam Constitutional:      Appearance: Normal appearance.  Cardiovascular:     Rate and Rhythm: Normal rate and  regular rhythm.  Pulmonary:     Effort: Pulmonary effort is normal.     Breath sounds: Normal breath sounds.  Skin:    Comments: Right middle finger reveals distal avulsion.  He has eschar.  There is no signs of secondary infection.  No bleeding.  Neurological:     Mental Status: He is alert.        Assessment:     Avulsion laceration right middle finger.  No active bleeding and no secondary infection    Plan:     -Reviewed appropriate wound care.  Keep clean with soap and water.  Recommend he try to keep some bacitracin on this to keep this from drying and cracking open.  Follow-up for any signs of secondary infection  Eulas Post MD [facility] at [facility]

## 2018-08-26 NOTE — Patient Instructions (Signed)
Keep wound clean with soap and water   Follow up for any increased redness or swelling.

## 2018-09-05 DIAGNOSIS — R69 Illness, unspecified: Secondary | ICD-10-CM | POA: Diagnosis not present

## 2018-09-16 ENCOUNTER — Ambulatory Visit: Payer: Medicare HMO | Admitting: Internal Medicine

## 2018-09-18 DIAGNOSIS — G4733 Obstructive sleep apnea (adult) (pediatric): Secondary | ICD-10-CM | POA: Diagnosis not present

## 2018-09-23 DIAGNOSIS — G4733 Obstructive sleep apnea (adult) (pediatric): Secondary | ICD-10-CM | POA: Diagnosis not present

## 2018-09-26 ENCOUNTER — Other Ambulatory Visit: Payer: Self-pay

## 2018-09-26 ENCOUNTER — Ambulatory Visit (INDEPENDENT_AMBULATORY_CARE_PROVIDER_SITE_OTHER): Payer: Medicare HMO | Admitting: Family Medicine

## 2018-09-26 ENCOUNTER — Encounter: Payer: Self-pay | Admitting: Family Medicine

## 2018-09-26 VITALS — BP 134/70 | HR 73 | Temp 98.2°F | Ht 71.0 in | Wt 219.5 lb

## 2018-09-26 DIAGNOSIS — J441 Chronic obstructive pulmonary disease with (acute) exacerbation: Secondary | ICD-10-CM

## 2018-09-26 MED ORDER — PREDNISONE 10 MG PO TABS: ORAL_TABLET | ORAL | 0 refills | Status: DC

## 2018-09-26 MED ORDER — ALBUTEROL SULFATE HFA 108 (90 BASE) MCG/ACT IN AERS: 2.0000 | INHALATION_SPRAY | Freq: Four times a day (QID) | RESPIRATORY_TRACT | 3 refills | Status: DC | PRN

## 2018-09-26 MED ORDER — DOXYCYCLINE HYCLATE 100 MG PO CAPS: 100.0000 mg | ORAL_CAPSULE | Freq: Two times a day (BID) | ORAL | 0 refills | Status: DC

## 2018-09-26 NOTE — Progress Notes (Signed)
  Subjective:     Patient ID: Kyle [name], male   DOB: Oct 08, 1945, 73 y.o.   MRN: [id number]  HPI Patient is non-smoker seen with about 10-day history of respiratory symptoms consisting of nasal congestion, some fatigue, cough, mild dyspnea, wheezing.  He has history of mixed type COPD as well as obstructive sleep apnea.  Denies any recent fever.  No hemoptysis.  Cough somewhat worse at night.  He has been using over-the-counter cough drops but no other medications. Mild dyspnea with activity.  No chest pain.    Other medical problems include history of hypothyroidism, hyperlipidemia, prediabetes, hypertension.  Also has history of CAD.  Past Medical History:  Diagnosis Date  . CAD (coronary artery disease) 2011  . Cardiac arrhythmia   . COPD (chronic obstructive pulmonary disease) ([place])   . GERD (gastroesophageal reflux disease)   . Hyperlipidemia   . Hypertension   . Myocardial infarction [facility]) 2011   Past Surgical History:  Procedure Laterality Date  . APPENDECTOMY    . CARDIAC CATHETERIZATION N/A 05/05/2016   Procedure: Left Heart Cath and Coronary Angiography;  Surgeon: Jerline Pain, MD;  Location: [place] [facility];  Service: Cardiovascular;  Laterality: N/A;  . CORONARY ANGIOPLASTY WITH STENT PLACEMENT    . EYE SURGERY     lasix /BIL    reports that he quit smoking about 24 years ago. His smoking use included cigarettes. He has a 112.00 pack-year smoking history. He has never used smokeless tobacco. He reports current alcohol use of about 5.0 standard drinks of alcohol per week. He reports that he does not use drugs. family history includes Multiple sclerosis in his sister; Stroke in his father and mother. No Known Allergies   Review of Systems  Constitutional: Positive for fatigue. Negative for chills and fever.  HENT: Positive for congestion.   Respiratory: Positive for cough, shortness of breath and wheezing.   Cardiovascular: Negative for chest pain.  Neurological:  Negative for dizziness.       Objective:   Physical Exam Constitutional:      Appearance: Normal appearance.  HENT:     Right Ear: Tympanic membrane normal.     Left Ear: Tympanic membrane normal.     Mouth/Throat:     Pharynx: Oropharynx is clear. No oropharyngeal exudate or posterior oropharyngeal erythema.  Neck:     Musculoskeletal: Neck supple.  Cardiovascular:     Rate and Rhythm: Normal rate and regular rhythm.  Pulmonary:     Effort: No respiratory distress.     Breath sounds: Wheezing present. No rales.  Neurological:     Mental Status: He is alert.        Assessment:     Acute exacerbation of COPD.  Patient has significant wheezing on exam but no respiratory distress.  Pulse oximetry 96%.    Plan:     -Nebulizer given with albuterol.  Auscultated afterwards and less wheezing and less cough.   -Refill albuterol MDI for home use as needed -Brief prednisone taper -We will start doxycycline 100 mg twice daily for 7 days -Follow-up promptly for any fever or increasing shortness of breath  Eulas Post MD [facility]  -

## 2018-09-26 NOTE — Patient Instructions (Signed)
Chronic Obstructive Pulmonary Disease Exacerbation    Chronic obstructive pulmonary disease (COPD) is a long-term (chronic) condition that affects the lungs. COPD is a general term that can be used to describe many different lung problems that cause lung swelling (inflammation) and limit airflow, including chronic bronchitis and emphysema. COPD exacerbations are episodes when breathing symptoms become much worse and require extra treatment.  COPD exacerbations are usually caused by infections. Without treatment, COPD exacerbations can be severe and even life threatening. Frequent COPD exacerbations can cause further damage to the lungs.  What are the causes?  This condition may be caused by:  · Respiratory infections, including viral and bacterial infections.  · Exposure to smoke.  · Exposure to air pollution, chemical fumes, or dust.  · Things that give you an allergic reaction (allergens).  · Not taking your usual COPD medicines as directed.  · Underlying medical problems, such as congestive heart failure or infections not involving the lungs.  In many cases, the cause (trigger) of this condition is not known.  What increases the risk?  The following factors may make you more likely to develop this condition:  · Smoking cigarettes.  · Old age.  · Frequent prior COPD exacerbations.  What are the signs or symptoms?  Symptoms of this condition include:  · Increased coughing.  · Increased production of mucus from your lungs (sputum).  · Increased wheezing.  · Increased shortness of breath.  · Rapid or labored breathing.  · Chest tightness.  · Less energy than usual.  · Sleep disruption from symptoms.  · Confusion or increased sleepiness.  Often these symptoms happen or get worse even with the use of medicines.  How is this diagnosed?  This condition is diagnosed based on:  · Your medical history.  · A physical exam.  You may also have tests, including:  · A chest X-ray.  · Blood tests.  · Lung (pulmonary) function  tests.  How is this treated?  Treatment for this condition depends on the severity and cause of the symptoms. You may need to be admitted to a hospital for treatment. Some of the treatments commonly used to treat COPD exacerbations are:  · Antibiotic medicines. These may be used for severe exacerbations caused by a lung infection, such as pneumonia.  · Bronchodilators. These are inhaled medicines that expand the air passages and allow increased airflow.  · Steroid medicines. These act to reduce inflammation in the airways. They may be given with an inhaler, taken by mouth, or given through an IV tube inserted into one of your veins.  · Supplemental oxygen therapy.  · Airway clearing techniques, such as noninvasive ventilation (NIV) and positive expiratory pressure (PEP). These provide respiratory support through a mask or other noninvasive device. An example of this would be using a continuous positive airway pressure (CPAP) machine to improve delivery of oxygen into your lungs.  Follow these instructions at home:  Medicines  · Take over-the-counter and prescription medicines only as told by your health care provider. It is important to use correct technique with inhaled medicines.  · If you were prescribed an antibiotic medicine or oral steroid, take it as told by your health care provider. Do not stop taking the medicine even if you start to feel better.  Lifestyle  · Eat a healthy diet.  · Exercise regularly.  · Get plenty of sleep.  · Avoid exposure to all substances that irritate the airway, especially to tobacco smoke.  · Wash   your hands often with soap and water to reduce the risk of infection. If soap and water are not available, use hand sanitizer.  · During flu season, avoid enclosed spaces that are crowded with people.  General instructions  · Drink enough fluid to keep your urine clear or pale yellow (unless you have a medical condition that requires fluid restriction).  · Use a cool mist vaporizer. This  humidifies the air and makes it easier for you to clear your chest when you cough.  · If you have a home nebulizer and oxygen, continue to use them as told by your health care provider.  · Keep all follow-up visits as told by your health care provider. This is important.  How is this prevented?  · Stay up-to-date on pneumococcal and influenza (flu) vaccines. A flu shot is recommended every year to help prevent exacerbations.  · Do not use any products that contain nicotine or tobacco, such as cigarettes and e-cigarettes. Quitting smoking is very important in preventing COPD from getting worse and in preventing exacerbations from happening as often. If you need help quitting, ask your health care provider.  · Follow all instructions for pulmonary rehabilitation after a recent exacerbation. This can help prevent future exacerbations.  · Work with your health care provider to develop and follow an action plan. This tells you what steps to take when you experience certain symptoms.  Contact a health care provider if:  · You have a worsening of your regular COPD symptoms.  Get help right away if:  · You have worsening shortness of breath, even when resting.  · You have trouble talking.  · You have severe chest pain.  · You cough up blood.  · You have a fever.  · You have weakness, vomit repeatedly, or faint.  · You feel confused.  · You are not able to sleep because of your symptoms.  · You have trouble doing daily activities.  Summary  · COPD exacerbations are episodes when breathing symptoms become much worse and require extra treatment above your normal treatment.  · Exacerbations can be severe and even life threatening. Frequent COPD exacerbations can cause further damage to your lungs.  · COPD exacerbations are usually triggered by infections such as the flu, colds, and even pneumonia.  · Treatment for this condition depends on the severity and cause of the symptoms. You may need to be admitted to a hospital for  treatment.  · Quitting smoking is very important to prevent COPD from getting worse and to prevent exacerbations from happening as often.  This information is not intended to replace advice given to you by your health care provider. Make sure you discuss any questions you have with your health care provider.  Document Released: 05/17/2007 Document Revised: 01/13/2017 Document Reviewed: 08/24/2016  Elsevier Interactive Patient Education © 2019 [employer] Inc[employer]

## 2018-10-04 ENCOUNTER — Other Ambulatory Visit (INDEPENDENT_AMBULATORY_CARE_PROVIDER_SITE_OTHER): Payer: Medicare HMO

## 2018-10-04 DIAGNOSIS — E039 Hypothyroidism, unspecified: Secondary | ICD-10-CM

## 2018-10-04 LAB — T4, FREE: FREE T4: 0.9 ng/dL (ref 0.60–1.60)

## 2018-10-04 LAB — TSH: TSH: 0.2 u[IU]/mL — AB (ref 0.35–4.50)

## 2018-10-05 ENCOUNTER — Other Ambulatory Visit: Payer: Self-pay

## 2018-10-05 DIAGNOSIS — E039 Hypothyroidism, unspecified: Secondary | ICD-10-CM

## 2018-10-06 ENCOUNTER — Other Ambulatory Visit: Payer: Self-pay | Admitting: Family Medicine

## 2018-10-11 ENCOUNTER — Encounter: Payer: Self-pay | Admitting: Cardiology

## 2018-10-11 ENCOUNTER — Ambulatory Visit: Payer: Medicare HMO | Admitting: Cardiology

## 2018-10-11 VITALS — BP 116/60 | HR 80 | Ht 71.0 in | Wt 221.8 lb

## 2018-10-11 DIAGNOSIS — I10 Essential (primary) hypertension: Secondary | ICD-10-CM

## 2018-10-11 DIAGNOSIS — I2510 Atherosclerotic heart disease of native coronary artery without angina pectoris: Secondary | ICD-10-CM

## 2018-10-11 DIAGNOSIS — J449 Chronic obstructive pulmonary disease, unspecified: Secondary | ICD-10-CM | POA: Diagnosis not present

## 2018-10-11 NOTE — Patient Instructions (Signed)
Medication Instructions:  The current medical regimen is effective;  continue present plan and medications.  If you need a refill on your cardiac medications before your next appointment, please call your pharmacy.   Follow-Up: At [facility], you and your health needs are our priority.  As part of our continuing mission to provide you with exceptional heart care, we have created designated Provider Care Teams.  These Care Teams include your primary Cardiologist (physician) and Advanced Practice Providers (APPs -  Physician Assistants and Nurse Practitioners) who all work together to provide you with the care you need, when you need it. You will need a follow up appointment in 12 months.  Please call our office 2 months in advance to schedule this appointment.  You may see Mark Skains, MD or one of the following Advanced Practice Providers on your designated Care Team:   Lori Gerhardt, NP Laura Ingold, NP . Jill McDaniel, NP  Thank you for choosing [facility]!!      

## 2018-10-11 NOTE — Progress Notes (Signed)
[facility]. [address] [place], [place]  [address] Phone: [phone] Fax:  [phone]  Date:  10/11/2018   ID:  Elston, Aldape February 08, 1946, MRN [id number]  PCP:  Eulas Post, MD   History of Present Illness: Kyle [name] is a 73 y.o. male with coronary artery disease status post non-ST elevation myocardial infarction with hyperlipidemia, hypertension, obesity here for followup.  He had been noticing more increasing/significant shortness of breath with activity concerning for anginal symptoms. It is hard to distinguish whether or not this is secondary to COPD. No chest pain.Going up stair. Enjoys playing golf, when he went to the beach, is challenging for him to go upstairs. His wife was concerned. Therefore, he underwent cardiac catheterization on 05/05/16 which was reassuring, patent stent.    He underwent echocardiogram on 03/31/12 that showed an ejection fraction of 50% with base to mid inferior hypokinesis.  He also underwent a nuclear stress test at that time which showed overall low risk with no evidence of significant ischemia. His original cardiac catheterization was in June of 2011. Stent was placed to the right coronary artery at that time. This was in the setting of non-ST elevation myocardial infarction.  Dr. Annamaria Boots is been following him for his obstructive sleep apnea.  08/10/17-overall he has been doing well without any chest discomfort.  No significant change with shortness of breath, activity.  COPD fairly significant.  Dr. Janee Morn note reviewed. [employer]. 2.5 miles. Lightheaded reaching up.   10/11/2018- here for the follow-up of coronary artery disease with prior MI.  Last cath 2017 stent was patent.  Original cath 2011.  Right coronary artery. Cough, took steroids. Same time every year. Chronic SOB. Not any better. COPD.  No fevers chills nausea vomiting syncope bleeding  Wt Readings from Last 3 Encounters:  10/11/18 221 lb 12.8 oz (100.6 kg)    09/26/18 219 lb 8 oz (99.6 kg)  08/26/18 221 lb 8 oz (100.5 kg)     Past Medical History:  Diagnosis Date  . CAD (coronary artery disease) 2011  . Cardiac arrhythmia   . COPD (chronic obstructive pulmonary disease) ([place])   . GERD (gastroesophageal reflux disease)   . Hyperlipidemia   . Hypertension   . Myocardial infarction [facility]) 2011    Past Surgical History:  Procedure Laterality Date  . APPENDECTOMY    . CARDIAC CATHETERIZATION N/A 05/05/2016   Procedure: Left Heart Cath and Coronary Angiography;  Surgeon: Jerline Pain, MD;  Location: [place] [facility];  Service: Cardiovascular;  Laterality: N/A;  . CORONARY ANGIOPLASTY WITH STENT PLACEMENT    . EYE SURGERY     lasix /BIL    Current Outpatient Medications  Medication Sig Dispense Refill  . albuterol (PROVENTIL HFA;VENTOLIN HFA) 108 (90 Base) MCG/ACT inhaler Inhale 2 puffs into the lungs every 6 (six) hours as needed for wheezing or shortness of breath. 1 Inhaler 3  . aspirin 81 MG tablet Take 81 mg by mouth daily.    Marland Kitchen atorvastatin (LIPITOR) 40 MG tablet TAKE ONE TABLET BY MOUTH DAILY AT 6PM 90 tablet 1  . benazepril-hydrochlorthiazide (LOTENSIN HCT) 20-25 MG tablet TAKE ONE-HALF TABLET BY MOUTH DAILY 45 tablet 0  . cetirizine (ZYRTEC) 10 MG tablet Take 10 mg by mouth daily.    . Cyanocobalamin (B-12 PO) Take 1 tablet by mouth daily.    Marland Kitchen doxycycline (VIBRAMYCIN) 100 MG capsule Take 1 capsule (100 mg total) by mouth 2 (two) times  daily. 20 capsule 0  . Fluticasone-Umeclidin-Vilant (TRELEGY ELLIPTA) 100-62.5-25 MCG/INH AEPB Inhale 1 puff into the lungs daily. 1 each 0  . levothyroxine (SYNTHROID, LEVOTHROID) 25 MCG tablet Take 25 mcg by mouth daily.    . Multiple Vitamin (MULTIVITAMIN) tablet Take 1 tablet by mouth daily.    Marland Kitchen omeprazole (PRILOSEC OTC) 20 MG tablet Take 20 mg by mouth daily as needed (heartburn).     . predniSONE (DELTASONE) 10 MG tablet Take 10 mg by mouth daily.    . valACYclovir (VALTREX) 1000 MG tablet  1 g.     No current facility-administered medications for this visit.     Allergies:   No Known Allergies  Social History:  The patient  reports that he quit smoking about 24 years ago. His smoking use included cigarettes. He has a 112.00 pack-year smoking history. He has never used smokeless tobacco. He reports current alcohol use of about 5.0 standard drinks of alcohol per week. He reports that he does not use drugs.   Family History  Problem Relation Age of Onset  . Stroke Mother   . Stroke Father   . Multiple sclerosis Sister     ROS:  Please see the history of present illness.  Unless specified above all other review of systems negative  PHYSICAL EXAM: VS:  BP 116/60   Pulse 80   Ht 5\' 11"  (1.803 m)   Wt 221 lb 12.8 oz (100.6 kg)   BMI 30.93 kg/m  GEN: Well nourished, well developed, in no acute distress  HEENT: normal  Neck: no JVD, carotid bruits, or masses Cardiac: RRR; no murmurs, rubs, or gallops,no edema  Respiratory:  clear to auscultation bilaterally, normal work of breathing, somewhat distant breath sounds GI: soft, nontender, nondistended, + BS MS: no deformity or atrophy  Skin: warm and dry, no rash Neuro:  Alert and Oriented x 3, Strength and sensation are intact Psych: euthymic mood, full affect    EKG: Today: 10/11/2018- normal sinus rhythm 80 no other significant abnormalities 08/10/17-sinus rhythm 78 with no other abnormalities personally viewed-prior 04/27/16-sinus bradycardia rate 54 no other abnormalities. 04/12/15-size bradycardia rate 57 with no other abnormalities personally viewed- 03/07/14-sinus bradycardia rate 59 with PVCs, 2 seen on EKG    LABS: LDL 59 on 06/09/13  Cath 05/05/16:  The left ventricular systolic function is normal.  LV end diastolic pressure is normal.  There is no aortic valve stenosis.  There is no mitral valve regurgitation.  Previously placed right coronary artery mid stent widely patent.  30-40% distal RCA  stenosis  Minor luminal irregularities in left system.   Reassuring. Nonobstructive coronary artery disease. Patent right coronary stent. Continue with medical management. Candee Furbish, MD  ASSESSMENT AND PLAN:  1. CAD -post cardiac catheterization on 05/05/16 where his previously placed right coronary artery mid stent was widely patent.  Reassuring.  Continue with aggressive secondary risk factor prevention.  Due to prior bradycardia and decreased energy, his low-dose metoprolol was discontinued at a prior office visit. encouraged him to use Silver sneakers, exercise. This will be good for both his heart and his lungs.  Continue to encourage his exercise.  Stable coronary artery disease.  Medications reviewed. 2. Hypertension-well-controlled.  Still cutting his ACE/HCTZ in half.  Doing very well.   3. Hyperlipidemia-atorvastatin 40. LDL goal less than 70.  Medications reviewed last LDL 56 4. PVC's-Asymptomatic.   Seems to be doing quite well without beta-blocker, no PVCs seen on ECG 5. OSA -  Dr. Annamaria Boots.  BIPAP now, had repeat sleep study 6. COPD-many years of smoking. Pulmonary, Dr. Annamaria Boots.COPD is causing most of his shortness of breath. Improving with [employer].    One year followup  Signed, Candee Furbish, MD [facility]  10/11/2018 2:58 PM

## 2018-10-17 DIAGNOSIS — G4733 Obstructive sleep apnea (adult) (pediatric): Secondary | ICD-10-CM | POA: Diagnosis not present

## 2018-10-20 ENCOUNTER — Other Ambulatory Visit: Payer: Self-pay | Admitting: Cardiology

## 2018-10-24 DIAGNOSIS — G4733 Obstructive sleep apnea (adult) (pediatric): Secondary | ICD-10-CM | POA: Diagnosis not present

## 2018-11-17 DIAGNOSIS — G4733 Obstructive sleep apnea (adult) (pediatric): Secondary | ICD-10-CM | POA: Diagnosis not present

## 2018-11-24 DIAGNOSIS — G4733 Obstructive sleep apnea (adult) (pediatric): Secondary | ICD-10-CM | POA: Diagnosis not present

## 2018-12-01 ENCOUNTER — Telehealth: Payer: Self-pay | Admitting: Internal Medicine

## 2018-12-01 NOTE — Telephone Encounter (Signed)
LMTCB x2  

## 2018-12-01 NOTE — Telephone Encounter (Signed)
Pt's wife Butch Penny is calling back [phone]

## 2018-12-01 NOTE — Telephone Encounter (Signed)
Pt is calling back [phone]

## 2018-12-01 NOTE — Telephone Encounter (Signed)
Attempted to call pt's wife Butch Penny but unable to reach. Left message for Butch Penny to return call.

## 2018-12-02 NOTE — Telephone Encounter (Signed)
Pt's spouse bought some mask onlline and wanted to know if they were ok. Advised that this should be fine. Nothing further needed.

## 2018-12-05 ENCOUNTER — Other Ambulatory Visit: Payer: Self-pay | Admitting: Family Medicine

## 2018-12-17 DIAGNOSIS — G4733 Obstructive sleep apnea (adult) (pediatric): Secondary | ICD-10-CM | POA: Diagnosis not present

## 2018-12-27 DIAGNOSIS — G4733 Obstructive sleep apnea (adult) (pediatric): Secondary | ICD-10-CM | POA: Diagnosis not present

## 2019-01-05 ENCOUNTER — Other Ambulatory Visit: Payer: Self-pay

## 2019-01-05 ENCOUNTER — Other Ambulatory Visit (INDEPENDENT_AMBULATORY_CARE_PROVIDER_SITE_OTHER): Payer: Medicare HMO

## 2019-01-05 DIAGNOSIS — E039 Hypothyroidism, unspecified: Secondary | ICD-10-CM

## 2019-01-05 LAB — TSH: TSH: 4.14 u[IU]/mL (ref 0.35–4.50)

## 2019-01-17 DIAGNOSIS — G4733 Obstructive sleep apnea (adult) (pediatric): Secondary | ICD-10-CM | POA: Diagnosis not present

## 2019-02-16 DIAGNOSIS — G4733 Obstructive sleep apnea (adult) (pediatric): Secondary | ICD-10-CM | POA: Diagnosis not present

## 2019-02-22 ENCOUNTER — Encounter: Payer: Self-pay | Admitting: Family Medicine

## 2019-02-23 ENCOUNTER — Telehealth: Payer: Self-pay | Admitting: Internal Medicine

## 2019-02-23 MED ORDER — TRELEGY ELLIPTA 100-62.5-25 MCG/INH IN AEPB: 1.0000 | INHALATION_SPRAY | Freq: Every day | RESPIRATORY_TRACT | 12 refills | Status: DC

## 2019-02-23 NOTE — Telephone Encounter (Signed)
Ok to Rx Trelegy inhaler, # 60 (= 1 device)   inhale 1 puff, then rinse mouth, once daily, ref x 12

## 2019-02-23 NOTE — Telephone Encounter (Signed)
Called and spoke with both pt's wife Kyle [name] and pt. Stated to them that [place] said to send Rx for Trelegy to pharmacy to see if this would help with symptoms. Stated to both of them if no improvement after beginning the inhaler, call us next week and we could see if CY could recommend anything else. Both verbalized understanding. Nothing further needed.

## 2019-02-23 NOTE — Telephone Encounter (Signed)
Called and spoke with Patient.  Patient stated he is having increased sob, chest feeling heavy, non productive cough, at times.  Patient has been having to clear his throat more often.  Patient stated he walked 1 mile Monday, and woke Tuesday morning, having coughing spell, and lightheadedness. Patient has been using incentive spirometry throughout the day.  Patient stated he was at 25,000, but now can not. Patient stated it is much lower.  Patient stated he gets sob, going from 1 room to the other.   Patient has been using his albuterol 3-4 times a day. Patient stated he was given a Trelegy sample in the past, and thought he may need something like that again.  Patient has been quarantined since march.  Patient denies any other symptom, covid exposures. Patient request any meds to go to [facility], [place]  Message routed to Dr Annamaria Boots  LOV 05/23/18  No Known Allergies Current Outpatient Medications on File Prior to Visit  Medication Sig Dispense Refill  . albuterol (PROVENTIL HFA;VENTOLIN HFA) 108 (90 Base) MCG/ACT inhaler Inhale 2 puffs into the lungs every 6 (six) hours as needed for wheezing or shortness of breath. 1 Inhaler 3  . aspirin 81 MG tablet Take 81 mg by mouth daily.    Marland Kitchen atorvastatin (LIPITOR) 40 MG tablet TAKE ONE TABLET BY MOUTH DAILY AT 6PM 90 tablet 1  . benazepril-hydrochlorthiazide (LOTENSIN HCT) 20-25 MG tablet TAKE ONE-HALF TABLET BY MOUTH DAILY 45 tablet 3  . cetirizine (ZYRTEC) 10 MG tablet Take 10 mg by mouth daily.    . Cyanocobalamin (B-12 PO) Take 1 tablet by mouth daily.    Marland Kitchen doxycycline (VIBRAMYCIN) 100 MG capsule Take 1 capsule (100 mg total) by mouth 2 (two) times daily. 20 capsule 0  . Fluticasone-Umeclidin-Vilant (TRELEGY ELLIPTA) 100-62.5-25 MCG/INH AEPB Inhale 1 puff into the lungs daily. 1 each 0  . levothyroxine (SYNTHROID, LEVOTHROID) 25 MCG tablet Take 25 mcg by mouth daily.    . Multiple Vitamin (MULTIVITAMIN) tablet Take 1 tablet by mouth  daily.    Marland Kitchen omeprazole (PRILOSEC OTC) 20 MG tablet Take 20 mg by mouth daily as needed (heartburn).     . predniSONE (DELTASONE) 10 MG tablet Take 10 mg by mouth daily.    . valACYclovir (VALTREX) 1000 MG tablet 1 g.     No current facility-administered medications on file prior to visit.

## 2019-03-19 DIAGNOSIS — G4733 Obstructive sleep apnea (adult) (pediatric): Secondary | ICD-10-CM | POA: Diagnosis not present

## 2019-04-11 ENCOUNTER — Other Ambulatory Visit: Payer: Self-pay

## 2019-04-11 ENCOUNTER — Ambulatory Visit (INDEPENDENT_AMBULATORY_CARE_PROVIDER_SITE_OTHER): Payer: Medicare HMO

## 2019-04-11 DIAGNOSIS — Z23 Encounter for immunization: Secondary | ICD-10-CM

## 2019-04-11 NOTE — Progress Notes (Signed)
Patient in office for flu vaccine. Flu vaccine administered with no reaction.

## 2019-04-19 DIAGNOSIS — G4733 Obstructive sleep apnea (adult) (pediatric): Secondary | ICD-10-CM | POA: Diagnosis not present

## 2019-05-01 DIAGNOSIS — G4733 Obstructive sleep apnea (adult) (pediatric): Secondary | ICD-10-CM | POA: Diagnosis not present

## 2019-05-12 ENCOUNTER — Other Ambulatory Visit: Payer: Self-pay | Admitting: Family Medicine

## 2019-05-12 MED ORDER — LEVOTHYROXINE SODIUM 25 MCG PO TABS: 25.0000 ug | ORAL_TABLET | Freq: Every day | ORAL | 3 refills | Status: DC

## 2019-05-12 NOTE — Telephone Encounter (Signed)
He is on 25 mcg- but had been cutting 50 mcg in half.   Refill Levothyroxine 25 mcg once daily #90 with 3 refills.

## 2019-05-12 NOTE — Telephone Encounter (Signed)
Rx done. 

## 2019-05-19 DIAGNOSIS — G4733 Obstructive sleep apnea (adult) (pediatric): Secondary | ICD-10-CM | POA: Diagnosis not present

## 2019-05-20 ENCOUNTER — Other Ambulatory Visit: Payer: Self-pay | Admitting: Family Medicine

## 2019-05-25 ENCOUNTER — Encounter: Payer: Self-pay | Admitting: Internal Medicine

## 2019-05-25 ENCOUNTER — Ambulatory Visit: Payer: Medicare HMO | Admitting: Internal Medicine

## 2019-05-25 ENCOUNTER — Other Ambulatory Visit: Payer: Self-pay

## 2019-05-25 VITALS — BP 148/70 | HR 69 | Temp 97.1°F | Ht 71.0 in | Wt 229.4 lb

## 2019-05-25 DIAGNOSIS — G4733 Obstructive sleep apnea (adult) (pediatric): Secondary | ICD-10-CM

## 2019-05-25 DIAGNOSIS — J449 Chronic obstructive pulmonary disease, unspecified: Secondary | ICD-10-CM | POA: Diagnosis not present

## 2019-05-25 MED ORDER — TRELEGY ELLIPTA 100-62.5-25 MCG/INH IN AEPB: 1.0000 | INHALATION_SPRAY | Freq: Every day | RESPIRATORY_TRACT | 12 refills | Status: DC

## 2019-05-25 MED ORDER — ALBUTEROL SULFATE HFA 108 (90 BASE) MCG/ACT IN AERS: 2.0000 | INHALATION_SPRAY | Freq: Four times a day (QID) | RESPIRATORY_TRACT | 12 refills | Status: DC | PRN

## 2019-05-25 NOTE — Progress Notes (Signed)
Subjective:    Patient ID: Kyle [name], male    DOB: 1946/03/24, 73 y.o.   MRN: [id number]  HPI   male former smoker followed for OSA, COPD GOLD III, Rhinitis Complicated by GERD, CAD/MI, HBP Unattended Home Sleep Test-01/05/2014, moderate OSA, AHI 19/hour, weight 223 pounds Office Spirometry 09/10/2016-severe obstructive airways disease: FVC 2.83/61%, FEV1 1.17/34%, ratio 0.41, FEF 25-75% 0.35/13% PFT 09/22/16-severe COPD, no response to dilator, diffusion moderately reduced, air trapping, hyperinflation. FVC 3.54/75%, FEV1 1.7 2/49%, ratio 0.49, TLC 123%, DLCO 56% -----------------------------------------------------------------   05/23/2018-  73 year old male former smoker followed for OSA, COPD, rhinitis complicated by GERD, CAD/MI, HBP -----Follows For: OSA, pt reports he is doing well on Cpap BiPAP titration sleep study 03/18/2018- Titrated to 15/ 11,  BIPAP 15/11/ PS 3 ([employer]) Download 100% compliance AHI 4.5/hour Wife says he does not snore at all using his BiPAP and he thinks he sleeps very well with it.  He is going to gym and playing golf regularly. Has not been using a maintenance bronchodilator but is using his rescue inhaler about 3 times a day-discussed.  05/25/2019- 73 year old male former smoker followed for OSA, COPD, rhinitis complicated by GERD, CAD/MI, HBP BIPAP 15/11/ PS 3 ([employer])/ [facility] -----OSA on CPAP, DME: [facility]; pt recently received new machine, states it works well. Sleeping well. Body weight today 229 lbs Trelegy, albuterol hfa, prednisone 10 mg daily Feels breathing is stable. Little aware of wheeze. Need Download Covid careful- discussed. Had flu vax  ROS-see HPI   + = positive Constitutional:    weight loss, night sweats, fevers, chills, fatigue, lassitude. HEENT:    headaches, difficulty swallowing, tooth/dental problems, sore throat,       sneezing, itching, ear ache, nasal congestion, post nasal drip,  snoring CV:    chest pain, orthopnea, PND, swelling in lower extremities, anasarca,                                                           dizziness, palpitations Resp:   +shortness of breath with exertion or at rest.                productive cough,   non-productive cough, coughing up of blood.              change in color of mucus.  +wheezing.   Skin:    rash or lesions. GI:  No-   heartburn, indigestion, abdominal pain, nausea, vomiting, diarrhea,                 change in bowel habits, loss of appetite GU: dysuria, change in color of urine, no urgency or frequency.   flank pain. MS:   joint pain, stiffness, decreased range of motion, back pain. Neuro-     nothing unusual Psych:  change in mood or affect.  depression or anxiety.   memory loss.     Objective:  OBJ- Physical Exam General- Alert, Oriented, Affect-appropriate, Distress- none acute, still overweight Skin- rash-none, lesions- none, excoriation- none Lymphadenopathy- none Head- atraumatic            Eyes- Gross vision intact, PERRLA, conjunctivae and secretions clear            Ears- Hearing, canals-normal  Nose- Clear, no-Septal dev, mucus, polyps, erosion, perforation,              Throat- Mallampati III , mucosa clear , drainage- none, tonsils- atrophic Neck- flexible , trachea midline, no stridor , thyroid nl, carotid no bruit Chest - symmetrical excursion , unlabored           Heart/CV- RRR , no murmur , no gallop  , no rub, nl s1 s2                           - JVD- none , edema- none, stasis changes- none, varices- none           Lung- + unlabored, Clear, wheeze- none, cough- none , dullness-none, rub- none           Chest wall-  Abd-  Br/ Gen/ Rectal- Not done, not indicated Extrem- cyanosis- none, clubbing, none, atrophy- none, strength- nl Neuro- grossly intact to observation  Assessment & Plan:

## 2019-05-25 NOTE — Patient Instructions (Signed)
Refill scripts sent for albuterol rescue inhaler and for Trelegy  Order DME [facility]-  Please send download, place AirView/ card Continue BIPAP 15/11, PS 3, mask of choice, humidifier, supplies,   Please call if we can help

## 2019-05-28 NOTE — Assessment & Plan Note (Signed)
Currently uncomplicated using Trelegy with occasional albuterol. Says maintenance prednisone was begun by PCP originally to deal with shingles. Likely dependent now. Discussed steroid side effects.

## 2019-05-28 NOTE — Assessment & Plan Note (Signed)
Benefits with better sleep. Comfortable with BIPAP 15/11, PS 3 Seek downloads for future management

## 2019-08-14 ENCOUNTER — Other Ambulatory Visit: Payer: Self-pay | Admitting: Family Medicine

## 2019-08-18 ENCOUNTER — Encounter: Payer: Self-pay | Admitting: Family Medicine

## 2019-08-29 ENCOUNTER — Encounter: Payer: Self-pay | Admitting: Family Medicine

## 2019-09-03 ENCOUNTER — Ambulatory Visit: Payer: Medicare HMO

## 2019-09-08 ENCOUNTER — Ambulatory Visit: Payer: Medicare HMO

## 2019-09-10 ENCOUNTER — Ambulatory Visit: Payer: Medicare HMO | Attending: Internal Medicine

## 2019-09-10 DIAGNOSIS — Z23 Encounter for immunization: Secondary | ICD-10-CM | POA: Insufficient documentation

## 2019-09-10 NOTE — Progress Notes (Signed)
   Covid-19 Vaccination Clinic  Name:  Kyle [name]    MRN: [id number] DOB: Apr 08, 1946  09/10/2019  [name] was observed post Covid-19 immunization for 15 minutes without incidence. He was provided with Vaccine Information Sheet and instruction to access the V-Safe system.   [name] was instructed to call 911 with any severe reactions post vaccine: Marland Kitchen Difficulty breathing  . Swelling of your face and throat  . A fast heartbeat  . A bad rash all over your body  . Dizziness and weakness    Immunizations Administered    Name Date Dose VIS Date Route   Pfizer COVID-19 Vaccine 09/10/2019  9:29 AM 0.3 mL 07/14/2019 Intramuscular   Manufacturer: [place]   Lot: [id number]   [place]: [id number]

## 2019-09-27 DIAGNOSIS — R69 Illness, unspecified: Secondary | ICD-10-CM | POA: Diagnosis not present

## 2019-09-29 ENCOUNTER — Other Ambulatory Visit: Payer: Self-pay | Admitting: Family Medicine

## 2019-10-02 DIAGNOSIS — G4733 Obstructive sleep apnea (adult) (pediatric): Secondary | ICD-10-CM | POA: Diagnosis not present

## 2019-10-04 ENCOUNTER — Ambulatory Visit: Payer: Medicare HMO | Attending: Internal Medicine

## 2019-10-04 DIAGNOSIS — Z23 Encounter for immunization: Secondary | ICD-10-CM | POA: Insufficient documentation

## 2019-10-04 NOTE — Progress Notes (Signed)
   Covid-19 Vaccination Clinic  Name:  PAPPU MCILLWAIN    MRN: [id number] DOB: Mar 12, 1946  10/04/2019  Mr. Soisson was observed post Covid-19 immunization for 15 minutes without incident. He was provided with Vaccine Information Sheet and instruction to access the V-Safe system.   Mr. Handfield was instructed to call 911 with any severe reactions post vaccine: Marland Kitchen Difficulty breathing  . Swelling of face and throat  . A fast heartbeat  . A bad rash all over body  . Dizziness and weakness   Immunizations Administered    Name Date Dose VIS Date Route   Pfizer COVID-19 Vaccine 10/04/2019  3:55 PM 0.3 mL 07/14/2019 Intramuscular   Manufacturer: [place]   Lot: [id number]   [place]: [id number]

## 2019-10-25 ENCOUNTER — Other Ambulatory Visit: Payer: Self-pay | Admitting: Family Medicine

## 2019-10-25 NOTE — Telephone Encounter (Signed)
Lvm for pt to call back pt needs appt for further refills

## 2019-10-26 ENCOUNTER — Ambulatory Visit: Payer: Medicare HMO | Admitting: Cardiology

## 2019-10-26 ENCOUNTER — Encounter: Payer: Self-pay | Admitting: Cardiology

## 2019-10-26 ENCOUNTER — Other Ambulatory Visit: Payer: Self-pay

## 2019-10-26 VITALS — BP 148/64 | HR 76 | Resp 15 | Ht 71.0 in | Wt 239.8 lb

## 2019-10-26 DIAGNOSIS — I2510 Atherosclerotic heart disease of native coronary artery without angina pectoris: Secondary | ICD-10-CM | POA: Diagnosis not present

## 2019-10-26 DIAGNOSIS — J449 Chronic obstructive pulmonary disease, unspecified: Secondary | ICD-10-CM

## 2019-10-26 DIAGNOSIS — I10 Essential (primary) hypertension: Secondary | ICD-10-CM | POA: Diagnosis not present

## 2019-10-26 MED ORDER — BENAZEPRIL-HYDROCHLOROTHIAZIDE 20-25 MG PO TABS: 0.5000 | ORAL_TABLET | Freq: Every day | ORAL | 3 refills | Status: DC

## 2019-10-26 MED ORDER — ATORVASTATIN CALCIUM 40 MG PO TABS: ORAL_TABLET | ORAL | 3 refills | Status: DC

## 2019-10-26 NOTE — Patient Instructions (Signed)
Medication Instructions:  The current medical regimen is effective;  continue present plan and medications.  *If you need a refill on your cardiac medications before your next appointment, please call your pharmacy*  Follow-Up: At [facility], you and your health needs are our priority.  As part of our continuing mission to provide you with exceptional heart care, we have created designated Provider Care Teams.  These Care Teams include your primary Cardiologist (physician) and Advanced Practice Providers (APPs -  Physician Assistants and Nurse Practitioners) who all work together to provide you with the care you need, when you need it.  We recommend signing up for the patient portal called "MyChart".  Sign up information is provided on this After Visit Summary.  MyChart is used to connect with patients for Virtual Visits (Telemedicine).  Patients are able to view lab/test results, encounter notes, upcoming appointments, etc.  Non-urgent messages can be sent to your provider as well.   To learn more about what you can do with MyChart, go to [email or web address].    Your next appointment:   12 month(s)  The format for your next appointment:   In Person  Provider:   Mark Skains, MD   Thank you for choosing [facility]!!      

## 2019-10-26 NOTE — Progress Notes (Signed)
Cardiology Office Note:    Date:  10/26/2019   ID:  Kyle [name], DOB 23-Apr-1946, MRN [id number]  PCP:  Eulas Post, MD  Cardiologist:  Candee Furbish, MD  Electrophysiologist:  None   Referring MD: Eulas Post, MD     History of Present Illness:    Kyle [name] is a 74 y.o. male here for follow-up of coronary artery disease, prior ST elevation MI 2011.  Cardiac catheterization 05/05/2016 last, reassuring patent stent in right coronary artery.  COPD has been tough. Mobility is short. Put on 15 pounds, more SOB with bending over. Trying to play golf, but less often.     Overall not feeling any chest pain no fevers chills.  He is received both Covid vaccines.  Describes how his wife is a Systems developer.  Enjoys different soups during the wintertime, swamp soup which has collards, 3 types of beans and a thin sausage.  Past Medical History:  Diagnosis Date  . CAD (coronary artery disease) 2011  . Cardiac arrhythmia   . COPD (chronic obstructive pulmonary disease) ([place])   . GERD (gastroesophageal reflux disease)   . Hyperlipidemia   . Hypertension   . Myocardial infarction [facility]) 2011    Past Surgical History:  Procedure Laterality Date  . APPENDECTOMY    . CARDIAC CATHETERIZATION N/A 05/05/2016   Procedure: Left Heart Cath and Coronary Angiography;  Surgeon: Jerline Pain, MD;  Location: [facility];  Service: Cardiovascular;  Laterality: N/A;  . CORONARY ANGIOPLASTY WITH STENT PLACEMENT    . EYE SURGERY     lasix /BIL    Current Medications: Current Meds  Medication Sig  . albuterol (VENTOLIN HFA) 108 (90 Base) MCG/ACT inhaler Inhale 2 puffs into the lungs every 6 (six) hours as needed for wheezing or shortness of breath.  Marland Kitchen aspirin 81 MG tablet Take 81 mg by mouth daily.  Marland Kitchen atorvastatin (LIPITOR) 40 MG tablet TAKE ONE TABLET BY MOUTH DAILY AT 6PM  . benazepril-hydrochlorthiazide (LOTENSIN HCT) 20-25 MG tablet Take 0.5 tablets by mouth daily.  .  cetirizine (ZYRTEC) 10 MG tablet Take 10 mg by mouth daily.  . Cyanocobalamin (B-12 PO) Take 1 tablet by mouth daily.  . Fluticasone-Umeclidin-Vilant (TRELEGY ELLIPTA) 100-62.5-25 MCG/INH AEPB Inhale 1 puff into the lungs daily.  Marland Kitchen levothyroxine (SYNTHROID) 25 MCG tablet Take 1 tablet (25 mcg total) by mouth daily before breakfast.  . Multiple Vitamin (MULTIVITAMIN) tablet Take 1 tablet by mouth daily.  Marland Kitchen omeprazole (PRILOSEC OTC) 20 MG tablet Take 20 mg by mouth daily as needed (heartburn).   . predniSONE (DELTASONE) 10 MG tablet Take 10 mg by mouth daily.  . valACYclovir (VALTREX) 1000 MG tablet 1 g.  . [DISCONTINUED] atorvastatin (LIPITOR) 40 MG tablet TAKE ONE TABLET BY MOUTH DAILY AT 6PM  . [DISCONTINUED] benazepril-hydrochlorthiazide (LOTENSIN HCT) 20-25 MG tablet TAKE ONE-HALF TABLET BY MOUTH DAILY     Allergies:   Patient has no known allergies.   Social History   Socioeconomic History  . Marital status: Married    Spouse name: Not on file  . Number of children: Not on file  . Years of education: Not on file  . Highest education level: Not on file  Occupational History  . Not on file  Tobacco Use  . Smoking status: Former Smoker    Packs/day: 4.00    Years: 28.00    Pack years: 112.00    Types: Cigarettes    Quit date: 12/10/1993  Years since quitting: 25.8  . Smokeless tobacco: Never Used  Substance and Sexual Activity  . Alcohol use: Yes    Alcohol/week: 5.0 standard drinks    Types: 5 Standard drinks or equivalent per week  . Drug use: No  . Sexual activity: Not on file  Other Topics Concern  . Not on file  Social History Narrative  . Not on file   Social Determinants of Health   Financial Resource Strain:   . Difficulty of Paying Living Expenses:   Food Insecurity:   . Worried About Charity fundraiser in the Last Year:   . Arboriculturist in the Last Year:   Transportation Needs:   . Film/video editor (Medical):   Marland Kitchen Lack of Transportation  (Non-Medical):   Physical Activity:   . Days of Exercise per Week:   . Minutes of Exercise per Session:   Stress:   . Feeling of Stress :   Social Connections:   . Frequency of Communication with Friends and Family:   . Frequency of Social Gatherings with Friends and Family:   . Attends Religious Services:   . Active Member of Clubs or Organizations:   . Attends Archivist Meetings:   Marland Kitchen Marital Status:      Family History: The patient's family history includes Multiple sclerosis in his sister; Stroke in his father and mother.  ROS:   Please see the history of present illness.     All other systems reviewed and are negative.  EKGs/Labs/Other Studies Reviewed:    The following studies were reviewed today: Prior EF normal  EKG:  EKG is  ordered today.  The ekg ordered today demonstrates sinus rhythm 76 no changes  Recent Labs: 01/05/2019: TSH 4.14  Recent Lipid Panel    Component Value Date/Time   CHOL 96 04/19/2018 1026   TRIG 78.0 04/19/2018 1026   HDL 24.60 (L) 04/19/2018 1026   CHOLHDL 4 04/19/2018 1026   VLDL 15.6 04/19/2018 1026   LDLCALC 56 04/19/2018 1026    Physical Exam:    VS:  BP (!) 148/64   Pulse 76   Resp 15   Ht 5\' 11"  (1.803 m)   Wt 239 lb 12.8 oz (108.8 kg)   SpO2 94%   BMI 33.45 kg/m     Wt Readings from Last 3 Encounters:  10/26/19 239 lb 12.8 oz (108.8 kg)  05/25/19 229 lb 6.4 oz (104.1 kg)  10/11/18 221 lb 12.8 oz (100.6 kg)     GEN:  Well nourished, well developed in no acute distress HEENT: Normal NECK: No JVD; No carotid bruits LYMPHATICS: No lymphadenopathy CARDIAC: RRR, no murmurs, rubs, gallops RESPIRATORY:  Clear to auscultation without rales, wheezing or rhonchi  ABDOMEN: Soft, non-tender, non-distended MUSCULOSKELETAL:  No edema; No deformity  SKIN: Warm and dry NEUROLOGIC:  Alert and oriented x 3 PSYCHIATRIC:  Normal affect   ASSESSMENT:    1. Coronary artery disease involving native coronary artery of  native heart without angina pectoris   2. Essential hypertension    PLAN:    In order of problems listed above:  Coronary artery disease -Last cath 2017 patent right coronary stent.  No changes made.  Continue with risk factor modification.  Essential hypertension -Doing well, mildly elevated today but overall controlled.  No changes in medications.  Hyperlipidemia -On atorvastatin high intensity dose 40 mg.  LDL has been 56 last check.  Obstructive sleep apnea -Dr. Annamaria Boots, BiPAP.  COPD -Dr.  Young has been monitoring.  Continue to encourage activity.  He is disappointed that he has not been able to go to the gym/[employer] secondary to Covid.   Medication Adjustments/Labs and Tests Ordered: Current medicines are reviewed at length with the patient today.  Concerns regarding medicines are outlined above.  Orders Placed This Encounter  Procedures  . EKG 12-Lead   Meds ordered this encounter  Medications  . benazepril-hydrochlorthiazide (LOTENSIN HCT) 20-25 MG tablet    Sig: Take 0.5 tablets by mouth daily.    Dispense:  45 tablet    Refill:  3  . atorvastatin (LIPITOR) 40 MG tablet    Sig: TAKE ONE TABLET BY MOUTH DAILY AT 6PM    Dispense:  90 tablet    Refill:  3    Patient Instructions  Medication Instructions:  The current medical regimen is effective;  continue present plan and medications.  *If you need a refill on your cardiac medications before your next appointment, please call your pharmacy*  Follow-Up: At [facility] you and your health needs are our priority.  As part of our continuing mission to provide you with exceptional heart care, we have created designated Provider Care Teams.  These Care Teams include your primary Cardiologist (physician) and Advanced Practice Providers (APPs -  Physician Assistants and Nurse Practitioners) who all work together to provide you with the care you need, when you need it.  We recommend signing up for the patient portal  called "MyChart".  Sign up information is provided on this After Visit Summary.  MyChart is used to connect with patients for Virtual Visits (Telemedicine).  Patients are able to view lab/test results, encounter notes, upcoming appointments, etc.  Non-urgent messages can be sent to your provider as well.   To learn more about what you can do with MyChart, go to [email or web address].    Your next appointment:   12 month(s)  The format for your next appointment:   In Person  Provider:   Candee Furbish, MD   Thank you for choosing [facility]!!        Signed, Candee Furbish, MD  10/26/2019 5:18 PM    [place]

## 2019-10-30 ENCOUNTER — Telehealth (INDEPENDENT_AMBULATORY_CARE_PROVIDER_SITE_OTHER): Payer: Medicare HMO | Admitting: Family Medicine

## 2019-10-30 ENCOUNTER — Other Ambulatory Visit: Payer: Self-pay

## 2019-10-30 DIAGNOSIS — E039 Hypothyroidism, unspecified: Secondary | ICD-10-CM | POA: Diagnosis not present

## 2019-10-30 DIAGNOSIS — E7800 Pure hypercholesterolemia, unspecified: Secondary | ICD-10-CM

## 2019-10-30 DIAGNOSIS — I10 Essential (primary) hypertension: Secondary | ICD-10-CM

## 2019-10-30 NOTE — Progress Notes (Signed)
This visit type was conducted due to national recommendations for restrictions regarding the COVID-19 pandemic in an effort to limit this patient's exposure and mitigate transmission in our community.   Virtual Visit via Video Note  I connected with Kyle [name] on 10/30/19 at 10:15 AM EDT by a video enabled telemedicine application and verified that I am speaking with the correct person using two identifiers.  Location patient: home Location provider:work or home office Persons participating in the virtual visit: patient, provider  I discussed the limitations of evaluation and management by telemedicine and the availability of in person appointments. The patient expressed understanding and agreed to proceed.   HPI: Kyle [name] has history of obstructive sleep apnea, COPD, CAD, hypothyroidism, hypertension.  He recently saw cardiology.  No changes in medication were made.  He remains on Lipitor, benazepril HCTZ, levothyroxine, and Trelegy Ellipta inhaler.  He has some chronic shortness of breath which is unchanged.  He and his wife both have gotten both Covid vaccines.  He has not been walking much.  No recent chest pain.    Compliant with medications.  Denies any side effects.  Overdue for labs.  He would like to get those through our office.   ROS: See pertinent positives and negatives per HPI.  Past Medical History:  Diagnosis Date  . CAD (coronary artery disease) 2011  . Cardiac arrhythmia   . COPD (chronic obstructive pulmonary disease) ([place])   . GERD (gastroesophageal reflux disease)   . Hyperlipidemia   . Hypertension   . Myocardial infarction [facility]) 2011    Past Surgical History:  Procedure Laterality Date  . APPENDECTOMY    . CARDIAC CATHETERIZATION N/A 05/05/2016   Procedure: Left Heart Cath and Coronary Angiography;  Surgeon: Jerline Pain, MD;  Location: [facility];  Service: Cardiovascular;  Laterality: N/A;  . CORONARY ANGIOPLASTY WITH STENT PLACEMENT    . EYE  SURGERY     lasix /BIL    Family History  Problem Relation Age of Onset  . Stroke Mother   . Stroke Father   . Multiple sclerosis Sister     SOCIAL HX: Married.  Quit smoking 1995   Current Outpatient Medications:  .  albuterol (VENTOLIN HFA) 108 (90 Base) MCG/ACT inhaler, Inhale 2 puffs into the lungs every 6 (six) hours as needed for wheezing or shortness of breath., Disp: 8 g, Rfl: 12 .  aspirin 81 MG tablet, Take 81 mg by mouth daily., Disp: , Rfl:  .  atorvastatin (LIPITOR) 40 MG tablet, TAKE ONE TABLET BY MOUTH DAILY AT 6PM, Disp: 90 tablet, Rfl: 3 .  benazepril-hydrochlorthiazide (LOTENSIN HCT) 20-25 MG tablet, Take 0.5 tablets by mouth daily., Disp: 45 tablet, Rfl: 3 .  cetirizine (ZYRTEC) 10 MG tablet, Take 10 mg by mouth daily., Disp: , Rfl:  .  Cyanocobalamin (B-12 PO), Take 1 tablet by mouth daily., Disp: , Rfl:  .  Fluticasone-Umeclidin-Vilant (TRELEGY ELLIPTA) 100-62.5-25 MCG/INH AEPB, Inhale 1 puff into the lungs daily., Disp: 60 each, Rfl: 12 .  levothyroxine (SYNTHROID) 25 MCG tablet, Take 1 tablet (25 mcg total) by mouth daily before breakfast., Disp: 90 tablet, Rfl: 3 .  Multiple Vitamin (MULTIVITAMIN) tablet, Take 1 tablet by mouth daily., Disp: , Rfl:  .  omeprazole (PRILOSEC OTC) 20 MG tablet, Take 20 mg by mouth daily as needed (heartburn). , Disp: , Rfl:  .  predniSONE (DELTASONE) 10 MG tablet, Take 10 mg by mouth daily., Disp: , Rfl:  .  valACYclovir (VALTREX) 1000  MG tablet, 1 g., Disp: , Rfl:   EXAM:  VITALS per patient if applicable:  GENERAL: alert, oriented, appears well and in no acute distress  HEENT: atraumatic, conjunttiva clear, no obvious abnormalities on inspection of external nose and ears  NECK: normal movements of the head and neck  LUNGS: on inspection no signs of respiratory distress, breathing rate appears normal, no obvious gross SOB, gasping or wheezing  CV: no obvious cyanosis  MS: moves all visible extremities without noticeable  abnormality  PSYCH/NEURO: pleasant and cooperative, no obvious depression or anxiety, speech and thought processing grossly intact  ASSESSMENT AND PLAN:  Discussed the following assessment and plan:  #1 hypertension.  History of good control -Check basic metabolic panel  #2 hypothyroidism-on replacement -Future labs with TSH  #3 hyperlipidemia with goal LDL less than 70.  He has history of CAD -Set up future labs with lipid and hepatic panel  #4 COPD symptomatically stable     I discussed the assessment and treatment plan with the patient. The patient was provided an opportunity to ask questions and all were answered. The patient agreed with the plan and demonstrated an understanding of the instructions.   The patient was advised to call back or seek an in-person evaluation if the symptoms worsen or if the condition fails to improve as anticipated.     Carolann Littler, MD

## 2019-10-31 ENCOUNTER — Other Ambulatory Visit (INDEPENDENT_AMBULATORY_CARE_PROVIDER_SITE_OTHER): Payer: Medicare HMO

## 2019-10-31 DIAGNOSIS — E039 Hypothyroidism, unspecified: Secondary | ICD-10-CM | POA: Diagnosis not present

## 2019-10-31 DIAGNOSIS — I10 Essential (primary) hypertension: Secondary | ICD-10-CM | POA: Diagnosis not present

## 2019-10-31 DIAGNOSIS — E7800 Pure hypercholesterolemia, unspecified: Secondary | ICD-10-CM

## 2019-10-31 LAB — BASIC METABOLIC PANEL
BUN: 16 mg/dL (ref 6–23)
CO2: 29 mEq/L (ref 19–32)
Calcium: 9.3 mg/dL (ref 8.4–10.5)
Chloride: 102 mEq/L (ref 96–112)
Creatinine, Ser: 1.03 mg/dL (ref 0.40–1.50)
GFR: 70.82 mL/min (ref >60.00)
Glucose, Bld: 119 mg/dL — ABNORMAL HIGH (ref 70–99)
Potassium: 4 mEq/L (ref 3.5–5.1)
Sodium: 138 mEq/L (ref 135–145)

## 2019-10-31 LAB — LIPID PANEL
Cholesterol: 97 mg/dL (ref 0–200)
HDL: 24.9 mg/dL — ABNORMAL LOW (ref >39.00)
LDL Cholesterol: 52 mg/dL (ref 0–99)
NonHDL: 71.82
Total CHOL/HDL Ratio: 4
Triglycerides: 97 mg/dL (ref 0.0–149.0)
VLDL: 19.4 mg/dL (ref 0.0–40.0)

## 2019-10-31 LAB — HEPATIC FUNCTION PANEL
ALT: 30 U/L (ref 0–53)
AST: 26 U/L (ref 0–37)
Albumin: 4.1 g/dL (ref 3.5–5.2)
Alkaline Phosphatase: 89 U/L (ref 39–117)
Bilirubin, Direct: 0.1 mg/dL (ref 0.0–0.3)
Total Bilirubin: 0.5 mg/dL (ref 0.2–1.2)
Total Protein: 6.8 g/dL (ref 6.0–8.3)

## 2019-10-31 LAB — TSH: TSH: 0.66 u[IU]/mL (ref 0.35–4.50)

## 2020-03-19 DIAGNOSIS — H5210 Myopia, unspecified eye: Secondary | ICD-10-CM | POA: Diagnosis not present

## 2020-03-19 DIAGNOSIS — Z0100 Encounter for examination of eyes and vision without abnormal findings: Secondary | ICD-10-CM | POA: Diagnosis not present

## 2020-04-03 DIAGNOSIS — R69 Illness, unspecified: Secondary | ICD-10-CM | POA: Diagnosis not present

## 2020-04-15 ENCOUNTER — Ambulatory Visit (INDEPENDENT_AMBULATORY_CARE_PROVIDER_SITE_OTHER): Payer: Medicare HMO

## 2020-04-15 ENCOUNTER — Other Ambulatory Visit: Payer: Self-pay

## 2020-04-15 DIAGNOSIS — Z23 Encounter for immunization: Secondary | ICD-10-CM

## 2020-04-17 DIAGNOSIS — G4733 Obstructive sleep apnea (adult) (pediatric): Secondary | ICD-10-CM | POA: Diagnosis not present

## 2020-05-05 ENCOUNTER — Encounter: Payer: Self-pay | Admitting: Family Medicine

## 2020-05-09 ENCOUNTER — Telehealth: Payer: Self-pay | Admitting: Family Medicine

## 2020-05-09 NOTE — Progress Notes (Signed)
  Chronic Care Management   Outreach Note  05/09/2020 Name: Kyle [name] MRN: [id number] DOB: 1945-09-15  Referred by: Eulas Post, MD Reason for referral : No chief complaint on file.   An unsuccessful telephone outreach was attempted today. The patient was referred to the pharmacist for assistance with care management and care coordination.   Follow Up Plan:   Carley Perdue [employer] Scheduler

## 2020-05-15 ENCOUNTER — Telehealth: Payer: Self-pay | Admitting: Family Medicine

## 2020-05-15 NOTE — Progress Notes (Signed)
  Chronic Care Management   Note  05/15/2020 Name: Kyle [name] MRN: [id number] DOB: 1945-08-19  Kyle [name] is a 74 y.o. year old male who is a primary care patient of Burchette, Alinda Sierras, MD. I reached out to [name] by phone today in response to a referral sent by [name] PCP, Eulas Post, MD.   [name] was given information about Chronic Care Management services today including:  1. CCM service includes personalized support from designated clinical staff supervised by his physician, including individualized plan of care and coordination with other care providers 2. 24/7 contact phone numbers for assistance for urgent and routine care needs. 3. Service will only be billed when office clinical staff spend 20 minutes or more in a month to coordinate care. 4. Only one practitioner may furnish and bill the service in a calendar month. 5. The patient may stop CCM services at any time (effective at the end of the month) by phone call to the office staff.   Patient wishes to consider information provided and/or speak with a member of the care team before deciding about enrollment in care management services.   Follow up plan:   Carley Perdue [employer] Scheduler

## 2020-05-15 NOTE — Progress Notes (Signed)
  Chronic Care Management   Outreach Note  05/15/2020 Name: Kyle [name] MRN: [id number] DOB: January 30, 1946  Referred by: Eulas Post, MD Reason for referral : No chief complaint on file.   A second unsuccessful telephone outreach was attempted today. The patient was referred to pharmacist for assistance with care management and care coordination.  Follow Up Plan:   Carley Perdue [employer] Scheduler

## 2020-05-24 ENCOUNTER — Encounter: Payer: Self-pay | Admitting: Internal Medicine

## 2020-05-24 ENCOUNTER — Other Ambulatory Visit: Payer: Self-pay

## 2020-05-24 ENCOUNTER — Ambulatory Visit: Payer: Medicare HMO | Admitting: Internal Medicine

## 2020-05-24 VITALS — BP 144/82 | HR 72 | Temp 97.4°F | Ht 71.0 in | Wt 234.2 lb

## 2020-05-24 DIAGNOSIS — G4733 Obstructive sleep apnea (adult) (pediatric): Secondary | ICD-10-CM

## 2020-05-24 DIAGNOSIS — J449 Chronic obstructive pulmonary disease, unspecified: Secondary | ICD-10-CM | POA: Diagnosis not present

## 2020-05-24 MED ORDER — ALBUTEROL SULFATE HFA 108 (90 BASE) MCG/ACT IN AERS: 2.0000 | INHALATION_SPRAY | Freq: Four times a day (QID) | RESPIRATORY_TRACT | 12 refills | Status: DC | PRN

## 2020-05-24 MED ORDER — TRELEGY ELLIPTA 100-62.5-25 MCG/INH IN AEPB: 1.0000 | INHALATION_SPRAY | Freq: Every day | RESPIRATORY_TRACT | 12 refills | Status: DC

## 2020-05-24 NOTE — Progress Notes (Signed)
Subjective:    Patient ID: Kyle [name], male    DOB: 1946-03-26, 74 y.o.   MRN: [id number]  HPI   male former smoker followed for OSA, COPD GOLD III, Rhinitis Complicated by GERD, CAD/MI, HBP Unattended Home Sleep Test-01/05/2014, moderate OSA, AHI 19/hour, weight 223 pounds Office Spirometry 09/10/2016-severe obstructive airways disease: FVC 2.83/61%, FEV1 1.17/34%, ratio 0.41, FEF 25-75% 0.35/13% PFT 09/22/16-severe COPD, no response to dilator, diffusion moderately reduced, air trapping, hyperinflation. FVC 3.54/75%, FEV1 1.7 2/49%, ratio 0.49, TLC 123%, DLCO 56% -----------------------------------------------------------------   05/25/2019- 74 year old male former smoker followed for OSA, COPD, rhinitis complicated by GERD, CAD/MI, HBP BIPAP 15/11/ PS 3 ([facility])/ [facility] -----OSA on CPAP, DME: [facility]; pt recently received new machine, states it works well. Sleeping well. Body weight today 229 lbs Trelegy, albuterol hfa, prednisone 10 mg daily Feels breathing is stable. Little aware of wheeze. Need Download Covid careful- discussed. Had flu vax  05/24/20- 74 year old male former smoker followed for OSA, COPD, rhinitis complicated by GERD, CAD/MI, HBP BIPAP 15/11/ PS 3 ([facility])/ [place]- 3 Phizer Flu vax- had Body weight today- 234 -----using Bipap at night  Trelegy, albuterol hfa,  Use Trelegy daily. Rescue 1-2x/ week. Admits DOE a little worse over time- golf, exertion like lifting a chair. Little cough or wheeze and no acute episode. Comfortable with his BIPAP.  ROS-see HPI   + = positive Constitutional:    weight loss, night sweats, fevers, chills, fatigue, lassitude. HEENT:    headaches, difficulty swallowing, tooth/dental problems, sore throat,       sneezing, itching, ear ache, nasal congestion, post nasal drip, snoring CV:    chest pain, orthopnea, PND, swelling in lower extremities, anasarca,                                                            dizziness, palpitations Resp:   +shortness of breath with exertion or at rest.                productive cough,   non-productive cough, coughing up of blood.              change in color of mucus.  +wheezing.   Skin:    rash or lesions. GI:  No-   heartburn, indigestion, abdominal pain, nausea, vomiting, diarrhea,                 change in bowel habits, loss of appetite GU: dysuria, change in color of urine, no urgency or frequency.   flank pain. MS:   joint pain, stiffness, decreased range of motion, back pain. Neuro-     nothing unusual Psych:  change in mood or affect.  depression or anxiety.   memory loss.     Objective:  OBJ- Physical Exam General- Alert, Oriented, Affect-appropriate, Distress- none acute, + overweight Skin- rash-none, lesions- none, excoriation- none Lymphadenopathy- none Head- atraumatic            Eyes- Gross vision intact, PERRLA, conjunctivae and secretions clear            Ears- Hearing, canals-normal            Nose- Clear, no-Septal dev, mucus, polyps, erosion, perforation,  Throat- Mallampati III , mucosa clear , drainage- none, tonsils- atrophic Neck- flexible , trachea midline, no stridor , thyroid nl, carotid no bruit Chest - symmetrical excursion , unlabored           Heart/CV- RRR , no murmur , no gallop  , no rub, nl s1 s2                           - JVD- none , edema- none, stasis changes- none, varices- none           Lung- + unlabored, Clear/ diminished, wheeze- none, cough- none , dullness-none, rub- none           Chest wall-  Abd-  Br/ Gen/ Rectal- Not done, not indicated Extrem- cyanosis- none, clubbing, none, atrophy- none, strength- nl Neuro- grossly intact to observation  Assessment & Plan:

## 2020-05-24 NOTE — Assessment & Plan Note (Signed)
Benefits from BIPAP and describes good symptom control Plan- asking [facility] to place [facility]/ card, continue 15/11  PS 3

## 2020-05-24 NOTE — Patient Instructions (Addendum)
Order- DME [facility]- Please resume [facility]View/ card Continue BIPAP 15/11  PS 3, mask of choice, humidifier, supplies,   Refills sent for Trelegy and Venolin/ albuterol inhalers  Please call if we can help

## 2020-05-24 NOTE — Assessment & Plan Note (Signed)
Some worsening of exercise tolerance nonspecific for lung/ heart/ age Plan- refill Trelegy and Ventolin, anticipate update CXR next visit

## 2020-05-31 DIAGNOSIS — G4733 Obstructive sleep apnea (adult) (pediatric): Secondary | ICD-10-CM | POA: Diagnosis not present

## 2020-07-19 ENCOUNTER — Telehealth: Payer: Self-pay | Admitting: Family Medicine

## 2020-07-19 NOTE — Progress Notes (Signed)
  Chronic Care Management   Outreach Note  07/19/2020 Name: Kyle [name] MRN: [id number] DOB: 11/16/1945  Referred by: Eulas Post, MD Reason for referral : No chief complaint on file.   Third unsuccessful telephone outreach was attempted today. The patient was referred to the pharmacist for assistance with care management and care coordination.   Follow Up Plan:   Carley Perdue [employer] Scheduler

## 2020-07-24 ENCOUNTER — Encounter: Payer: Self-pay | Admitting: Family Medicine

## 2020-07-29 ENCOUNTER — Telehealth: Payer: Self-pay | Admitting: Family Medicine

## 2020-07-29 NOTE — Progress Notes (Signed)
°  Chronic Care Management   Outreach Note  07/29/2020 Name: Kyle [name] MRN: [id number] DOB: 12-13-1945  Referred by: Kristian Covey, MD Reason for referral : No chief complaint on file.   Third unsuccessful telephone outreach was attempted today. The patient was referred to the pharmacist for assistance with care management and care coordination.   Follow Up Plan:   Carley Perdue [employer] Scheduler

## 2020-08-01 ENCOUNTER — Other Ambulatory Visit: Payer: Self-pay

## 2020-08-01 MED ORDER — LEVOTHYROXINE SODIUM 25 MCG PO TABS: 25.0000 ug | ORAL_TABLET | Freq: Every day | ORAL | 0 refills | Status: DC

## 2020-08-08 ENCOUNTER — Other Ambulatory Visit: Payer: Self-pay

## 2020-08-09 ENCOUNTER — Encounter: Payer: Self-pay | Admitting: Family Medicine

## 2020-08-09 ENCOUNTER — Ambulatory Visit (INDEPENDENT_AMBULATORY_CARE_PROVIDER_SITE_OTHER): Payer: Medicare HMO | Admitting: Family Medicine

## 2020-08-09 VITALS — BP 134/64 | HR 69 | Ht 71.0 in | Wt 256.0 lb

## 2020-08-09 DIAGNOSIS — L821 Other seborrheic keratosis: Secondary | ICD-10-CM

## 2020-08-09 DIAGNOSIS — E039 Hypothyroidism, unspecified: Secondary | ICD-10-CM | POA: Diagnosis not present

## 2020-08-09 DIAGNOSIS — I10 Essential (primary) hypertension: Secondary | ICD-10-CM

## 2020-08-09 DIAGNOSIS — E7800 Pure hypercholesterolemia, unspecified: Secondary | ICD-10-CM | POA: Diagnosis not present

## 2020-08-09 DIAGNOSIS — R7303 Prediabetes: Secondary | ICD-10-CM | POA: Diagnosis not present

## 2020-08-09 LAB — BASIC METABOLIC PANEL
BUN: 18 mg/dL (ref 6–23)
CO2: 30 mEq/L (ref 19–32)
Calcium: 9.7 mg/dL (ref 8.4–10.5)
Chloride: 103 mEq/L (ref 96–112)
Creatinine, Ser: 1.08 mg/dL (ref 0.40–1.50)
GFR: 68.03 mL/min (ref >60.00)
Glucose, Bld: 97 mg/dL (ref 70–99)
Potassium: 4.2 mEq/L (ref 3.5–5.1)
Sodium: 139 mEq/L (ref 135–145)

## 2020-08-09 LAB — HEPATIC FUNCTION PANEL
ALT: 31 U/L (ref 0–53)
AST: 28 U/L (ref 0–37)
Albumin: 4.5 g/dL (ref 3.5–5.2)
Alkaline Phosphatase: 82 U/L (ref 39–117)
Bilirubin, Direct: 0.1 mg/dL (ref 0.0–0.3)
Total Bilirubin: 0.4 mg/dL (ref 0.2–1.2)
Total Protein: 7.2 g/dL (ref 6.0–8.3)

## 2020-08-09 LAB — LIPID PANEL
Cholesterol: 103 mg/dL (ref 0–200)
HDL: 23.3 mg/dL — ABNORMAL LOW (ref >39.00)
LDL Cholesterol: 43 mg/dL (ref 0–99)
NonHDL: 79.66
Total CHOL/HDL Ratio: 4
Triglycerides: 184 mg/dL — ABNORMAL HIGH (ref 0.0–149.0)
VLDL: 36.8 mg/dL (ref 0.0–40.0)

## 2020-08-09 LAB — TSH: TSH: 0.11 u[IU]/mL — ABNORMAL LOW (ref 0.35–4.50)

## 2020-08-09 LAB — HEMOGLOBIN A1C: Hgb A1c MFr Bld: 6.9 % — ABNORMAL HIGH (ref 4.6–6.5)

## 2020-08-09 NOTE — Patient Instructions (Signed)

## 2020-08-09 NOTE — Progress Notes (Signed)
Established Patient Office Visit  Subjective:  Patient ID: Kyle [name], male    DOB: 1945/10/12  Age: 75 y.o. MRN: [id number]  CC:  Chief Complaint  Patient presents with  . Medication Management    HPI HARVY MAGNANO presents for medical follow-up.  His chronic problems include history of CAD, COPD, hypothyroidism, hypertension, hyperlipidemia.  Medications include aspirin 81 mg, Lipitor 40 mg, benazepril HCTZ 20/25 mg 1 daily, Trelegy inhaler  He is requesting refills of levothyroxine though it appears 90 were sent into his pharmacy on 30 December.  He states he is compliant with all medications.  No recent chest pains.  He has history of prediabetes range blood sugars.  Does not monitor blood sugars at home.  No polyuria or polydipsia.  Brown scaly lesion right thigh.  Not itching or bleeding.  History of sebaceous cyst of the back.  He has one recently that his wife squeezes whitish substance out.  No recent infections.  No associated pain.  Past Medical History:  Diagnosis Date  . CAD (coronary artery disease) 2011  . Cardiac arrhythmia   . COPD (chronic obstructive pulmonary disease) ([place])   . GERD (gastroesophageal reflux disease)   . Hyperlipidemia   . Hypertension   . Myocardial infarction [facility]) 2011    Past Surgical History:  Procedure Laterality Date  . APPENDECTOMY    . CARDIAC CATHETERIZATION N/A 05/05/2016   Procedure: Left Heart Cath and Coronary Angiography;  Surgeon: Jerline Pain, MD;  Location: [place] [facility];  Service: Cardiovascular;  Laterality: N/A;  . CORONARY ANGIOPLASTY WITH STENT PLACEMENT    . EYE SURGERY     lasix /BIL    Family History  Problem Relation Age of Onset  . Stroke Mother   . Stroke Father   . Multiple sclerosis Sister     Social History   Socioeconomic History  . Marital status: Married    Spouse name: Not on file  . Number of children: Not on file  . Years of education: Not on file  . Highest education level:  Not on file  Occupational History  . Not on file  Tobacco Use  . Smoking status: Former Smoker    Packs/day: 4.00    Years: 28.00    Pack years: 112.00    Types: Cigarettes    Quit date: 12/10/1993    Years since quitting: 26.6  . Smokeless tobacco: Never Used  Vaping Use  . Vaping Use: Never used  Substance and Sexual Activity  . Alcohol use: Yes    Alcohol/week: 5.0 standard drinks    Types: 5 Standard drinks or equivalent per week  . Drug use: No  . Sexual activity: Not on file  Other Topics Concern  . Not on file  Social History Narrative  . Not on file   Social Determinants of Health   Financial Resource Strain: Not on file  Food Insecurity: Not on file  Transportation Needs: Not on file  Physical Activity: Not on file  Stress: Not on file  Social Connections: Not on file  Intimate Partner Violence: Not on file    Outpatient Medications Prior to Visit  Medication Sig Dispense Refill  . albuterol (VENTOLIN HFA) 108 (90 Base) MCG/ACT inhaler Inhale 2 puffs into the lungs every 6 (six) hours as needed for wheezing or shortness of breath. 8 g 12  . aspirin 81 MG tablet Take 81 mg by mouth daily.    Marland Kitchen atorvastatin (LIPITOR) 40 MG  tablet TAKE ONE TABLET BY MOUTH DAILY AT 6PM 90 tablet 3  . benazepril-hydrochlorthiazide (LOTENSIN HCT) 20-25 MG tablet Take 0.5 tablets by mouth daily. 45 tablet 3  . cetirizine (ZYRTEC) 10 MG tablet Take 10 mg by mouth daily.    . Cyanocobalamin (B-12 PO) Take 1 tablet by mouth daily.    . Fluticasone-Umeclidin-Vilant (TRELEGY ELLIPTA) 100-62.5-25 MCG/INH AEPB Inhale 1 puff into the lungs daily. 60 each 12  . levothyroxine (SYNTHROID) 25 MCG tablet Take 1 tablet (25 mcg total) by mouth daily before breakfast. 90 tablet 0  . Multiple Vitamin (MULTIVITAMIN) tablet Take 1 tablet by mouth daily.    Marland Kitchen omeprazole (PRILOSEC OTC) 20 MG tablet Take 20 mg by mouth daily as needed (heartburn).     . valACYclovir (VALTREX) 1000 MG tablet 1 g.     No  facility-administered medications prior to visit.    No Known Allergies  ROS Review of Systems  Constitutional: Negative for fatigue.  Eyes: Negative for visual disturbance.  Respiratory: Negative for cough, chest tightness and shortness of breath.   Cardiovascular: Negative for chest pain, palpitations and leg swelling.  Neurological: Negative for dizziness, syncope, weakness, light-headedness and headaches.      Objective:    Physical Exam Vitals reviewed.  Constitutional:      Appearance: Normal appearance.  Cardiovascular:     Rate and Rhythm: Normal rate and regular rhythm.  Pulmonary:     Effort: Pulmonary effort is normal.     Breath sounds: Normal breath sounds.  Musculoskeletal:     Right lower leg: No edema.     Left lower leg: No edema.  Skin:    Comments: Has a couple of sebaceous cysts on back- not inflamed  Right thigh- Homogenous, brown, symmetric, scaly lesion about 1 cm diameter.    Neurological:     Mental Status: He is alert.     BP 134/64   Pulse 69   Ht 5\' 11"  (1.803 m)   Wt 256 lb (116.1 kg)   SpO2 96%   BMI 35.70 kg/m  Wt Readings from Last 3 Encounters:  08/09/20 256 lb (116.1 kg)  05/24/20 234 lb 3.2 oz (106.2 kg)  10/26/19 239 lb 12.8 oz (108.8 kg)     There are no preventive care reminders to display for this patient.  There are no preventive care reminders to display for this patient.  Lab Results  Component Value Date   TSH 0.66 10/31/2019   Lab Results  Component Value Date   WBC 4.9 04/19/2018   HGB 10.5 (L) 04/19/2018   HCT 33.0 (L) 04/19/2018   MCV 82.0 04/19/2018   PLT 223.0 04/19/2018   Lab Results  Component Value Date   NA 138 10/31/2019   K 4.0 10/31/2019   CO2 29 10/31/2019   GLUCOSE 119 (H) 10/31/2019   BUN 16 10/31/2019   CREATININE 1.03 10/31/2019   BILITOT 0.5 10/31/2019   ALKPHOS 89 10/31/2019   AST 26 10/31/2019   ALT 30 10/31/2019   PROT 6.8 10/31/2019   ALBUMIN 4.1 10/31/2019   CALCIUM 9.3  10/31/2019   GFR 70.82 10/31/2019   Lab Results  Component Value Date   CHOL 97 10/31/2019   Lab Results  Component Value Date   HDL 24.90 (L) 10/31/2019   Lab Results  Component Value Date   LDLCALC 52 10/31/2019   Lab Results  Component Value Date   TRIG 97.0 10/31/2019   Lab Results  Component Value Date  CHOLHDL 4 10/31/2019   Lab Results  Component Value Date   HGBA1C 6.9 (H) 06/25/2014      Assessment & Plan:   #1 hypothyroidism  -Recheck TSH -Adjust medication if indicated  #2 hypertension stable -Continue current medications  #3 dyslipidemia.  Goal LDL less than 70 -Recheck lipid panel and hepatic panel  #4 history of prediabetes range blood sugars -Check A1c -Reduce sugars and starches  #5 benign seborrheic keratosis of right thigh  #6 sebaceous cysts of back.  No orders of the defined types were placed in this encounter.   Follow-up: Return in about 6 months (around 02/06/2021).    Carolann Littler, MD

## 2020-08-12 NOTE — Progress Notes (Signed)
Mychart message sent: Lipids stable.  Thyroid is over-replaced (only on 25 mcg Levothyroxine currently). A1C 6.9%.  Recommend:  -stop the Levothyroxine  -reduce sugars and starches.  -recommend 3 month office follow up and will repeat TSH and A1C then.

## 2020-09-24 ENCOUNTER — Encounter: Payer: Self-pay | Admitting: Cardiology

## 2020-10-16 ENCOUNTER — Ambulatory Visit (INDEPENDENT_AMBULATORY_CARE_PROVIDER_SITE_OTHER): Payer: Medicare HMO

## 2020-10-16 ENCOUNTER — Other Ambulatory Visit: Payer: Self-pay

## 2020-10-16 DIAGNOSIS — Z0000 Encounter for general adult medical examination without abnormal findings: Secondary | ICD-10-CM

## 2020-10-16 NOTE — Progress Notes (Addendum)
Virtual Visit via Telephone Note  I connected with  Kyle [name] on 10/16/20 at  8:45 AM EDT by telephone and verified that I am speaking with the correct person using two identifiers.  Location: Patient: Home Provider: Office Persons participating in the virtual visit: patient/Nurse Health Advisor   I discussed the limitations, risks, security and privacy concerns of performing an evaluation and management service by telephone and the availability of in person appointments. The patient expressed understanding and agreed to proceed.  Interactive audio and video telecommunications were attempted between this nurse and patient, however failed, due to patient having technical difficulties OR patient did not have access to video capability.  We continued and completed visit with audio only.  Some vital signs may be absent or patient reported.   Kyle Brace, LPN    Subjective:   Kyle [name] is a 75 y.o. male who presents for an Initial Medicare Annual Wellness Visit.  Review of Systems     Cardiac Risk Factors include: advanced age (>15men, >8 women);dyslipidemia;hypertension;male gender;obesity (BMI >30kg/m2)     Objective:    There were no vitals filed for this visit. There is no height or weight on file to calculate BMI.  Advanced Directives 10/16/2020 08/24/2018 03/18/2018 05/05/2016  Does Patient Have a Medical Advance Directive? Yes No Yes Yes  Type of Advance Directive [place] in Chart? No - copy requested - No - copy requested No - copy requested  Would patient like information on creating a medical advance directive? - No - Patient declined - -    Current Medications (verified) Outpatient Encounter Medications as of 10/16/2020  Medication Sig  . albuterol (VENTOLIN HFA) 108 (90 Base) MCG/ACT inhaler Inhale 2 puffs into the lungs every 6 (six) hours as needed  for wheezing or shortness of breath.  Marland Kitchen aspirin 81 MG tablet Take 81 mg by mouth daily.  . benazepril-hydrochlorthiazide (LOTENSIN HCT) 20-25 MG tablet Take 0.5 tablets by mouth daily.  . cetirizine (ZYRTEC) 10 MG tablet Take 10 mg by mouth daily.  . Cyanocobalamin (B-12 PO) Take 1 tablet by mouth daily.  . Fluticasone-Umeclidin-Vilant (TRELEGY ELLIPTA) 100-62.5-25 MCG/INH AEPB Inhale 1 puff into the lungs daily.  . Multiple Vitamin (MULTIVITAMIN) tablet Take 1 tablet by mouth daily.  Marland Kitchen omeprazole (PRILOSEC OTC) 20 MG tablet Take 20 mg by mouth daily as needed (heartburn).   . valACYclovir (VALTREX) 1000 MG tablet 1 g.  Marland Kitchen atorvastatin (LIPITOR) 40 MG tablet TAKE ONE TABLET BY MOUTH DAILY AT 6PM (Patient not taking: Reported on 10/16/2020)  . levothyroxine (SYNTHROID) 25 MCG tablet Take 1 tablet (25 mcg total) by mouth daily before breakfast. (Patient not taking: Reported on 10/16/2020)   No facility-administered encounter medications on file as of 10/16/2020.    Allergies (verified) Patient has no known allergies.   History: Past Medical History:  Diagnosis Date  . CAD (coronary artery disease) 2011  . Cardiac arrhythmia   . COPD (chronic obstructive pulmonary disease) ([place])   . GERD (gastroesophageal reflux disease)   . Hyperlipidemia   . Hypertension   . Myocardial infarction [facility]) 2011   Past Surgical History:  Procedure Laterality Date  . APPENDECTOMY    . CARDIAC CATHETERIZATION N/A 05/05/2016   Procedure: Left Heart Cath and Coronary Angiography;  Surgeon: Jerline Pain, MD;  Location: [place] [facility];  Service: Cardiovascular;  Laterality: N/A;  . CORONARY  ANGIOPLASTY WITH STENT PLACEMENT    . EYE SURGERY     lasix /BIL   Family History  Problem Relation Age of Onset  . Stroke Mother   . Stroke Father   . Multiple sclerosis Sister    Social History   Socioeconomic History  . Marital status: Married    Spouse name: Not on file  . Number of children: Not on file   . Years of education: Not on file  . Highest education level: Not on file  Occupational History  . Not on file  Tobacco Use  . Smoking status: Former Smoker    Packs/day: 4.00    Years: 28.00    Pack years: 112.00    Types: Cigarettes    Quit date: 12/10/1993    Years since quitting: 26.8  . Smokeless tobacco: Never Used  Vaping Use  . Vaping Use: Never used  Substance and Sexual Activity  . Alcohol use: Yes    Alcohol/week: 5.0 standard drinks    Types: 5 Standard drinks or equivalent per week  . Drug use: No  . Sexual activity: Not on file  Other Topics Concern  . Not on file  Social History Narrative  . Not on file   Social Determinants of Health   Financial Resource Strain: Low Risk   . Difficulty of Paying Living Expenses: Not hard at all  Food Insecurity: No Food Insecurity  . Worried About Charity fundraiser in the Last Year: Never true  . Ran Out of Food in the Last Year: Never true  Transportation Needs: No Transportation Needs  . Lack of Transportation (Medical): No  . Lack of Transportation (Non-Medical): No  Physical Activity: Insufficiently Active  . Days of Exercise per Week: 3 days  . Minutes of Exercise per Session: 30 min  Stress: No Stress Concern Present  . Feeling of Stress : Not at all  Social Connections: Socially Integrated  . Frequency of Communication with Friends and Family: Twice a week  . Frequency of Social Gatherings with Friends and Family: Twice a week  . Attends Religious Services: 1 to 4 times per year  . Active Member of Clubs or Organizations: Yes  . Attends Archivist Meetings: 1 to 4 times per year  . Marital Status: Married    Tobacco Counseling Counseling given: Not Answered   Clinical Intake:  Pre-visit preparation completed: Yes  Pain : No/denies pain     BMI - recorded: 35.72 Nutritional Status: BMI > 30  Obese Nutritional Risks: None Diabetes: No  How often do you need to have someone help you  when you read instructions, pamphlets, or other written materials from your Kyle or pharmacy?: 1 - Never  Diabetic?No  Interpreter Needed?: No  Information entered by :: Kyle Rakes, LPN   Activities of Daily Living In your present state of health, do you have any difficulty performing the following activities: 10/16/2020  Hearing? N  Vision? N  Difficulty concentrating or making decisions? N  Walking or climbing stairs? N  Dressing or bathing? N  Doing errands, shopping? N  Preparing Food and eating ? N  Using the Toilet? N  In the past six months, have you accidently leaked urine? N  Do you have problems with loss of bowel control? N  Managing your Medications? N  Managing your Finances? N  Housekeeping or managing your Housekeeping? N  Some recent data might be hidden    Patient Care Team: Eulas Post,  MD as PCP - General (Family Medicine) Jerline Pain, MD as PCP - Cardiology (Cardiology)  Indicate any recent Medical Services you may have received from other than [facility] providers in the past year (date may be approximate).     Assessment:   This is a routine wellness examination for [place].  Hearing/Vision screen  Hearing Screening   125Hz  250Hz  500Hz  1000Hz  2000Hz  3000Hz  4000Hz  6000Hz  8000Hz   Right ear:           Left ear:           Comments: Pt denies any hearing issues   Vision Screening Comments: Pt follows up with Dr Milford Cage for annual eye exams   Dietary issues and exercise activities discussed: Current Exercise Habits: Home exercise routine, Type of exercise: walking, Time (Minutes): 30, Frequency (Times/Week): 3, Weekly Exercise (Minutes/Week): 90  Goals    . Patient Stated     None at this time      Depression Screen PHQ 2/9 Scores 10/16/2020 04/19/2018 03/13/2016 06/25/2014 06/16/2013  PHQ - 2 Score 0 0 0 0 0    Fall Risk Fall Risk  10/16/2020 04/19/2018 03/13/2016 06/25/2014 06/16/2013  Falls in the past year? 0 No No No No  Number falls  in past yr: 0 - - - -  Injury with Fall? 0 - - - -  Risk for fall due to : Impaired vision;Impaired balance/gait;Impaired mobility - - - -  Follow up Falls prevention discussed - - - -    FALL RISK PREVENTION PERTAINING TO THE HOME:  Any stairs in or around the home? No  If so, are there any without handrails? No  Home free of loose throw rugs in walkways, pet beds, electrical cords, etc? Yes  Adequate lighting in your home to reduce risk of falls? Yes   ASSISTIVE DEVICES UTILIZED TO PREVENT FALLS:  Life alert? No  Use of a cane, walker or w/c? No  Grab bars in the bathroom? Yes  Shower chair or bench in shower? Yes  Elevated toilet seat or a handicapped toilet? No   TIMED UP AND GO:  Was the test performed? No     Cognitive Function:     6CIT Screen 10/16/2020  What Year? 0 points  What month? 0 points  Count back from 20 0 points  Months in reverse 0 points  Repeat phrase 0 points    Immunizations Immunization History  Administered Date(s) Administered  . Fluad Quad(high Dose 65+) 04/11/2019, 04/15/2020  . Influenza Split 05/17/2012, 05/10/2014, 05/03/2016, 04/03/2017  . Influenza, High Dose Seasonal PF 04/03/2016, 04/06/2017, 04/03/2018, 04/19/2018  . Influenza,inj,Quad PF,6+ Mos 04/13/2013, 04/29/2015  . Influenza-Unspecified 04/11/2019  . PFIZER(Purple Top)SARS-COV-2 Vaccination 09/10/2019, 10/04/2019, 05/03/2020  . Pneumococcal Conjugate-13 03/13/2016  . Pneumococcal Polysaccharide-23 05/17/2012  . Tdap 11/12/2011, 03/13/2016, 08/24/2018  . Zoster 06/16/2013  . Zoster Recombinat (Shingrix) 08/23/2018, 02/20/2019    TDAP status: Up to date  Flu Vaccine status: Up to date  Pneumococcal vaccine status: Up to date  Covid-19 vaccine status: Completed vaccines  Qualifies for Shingles Vaccine? Yes   Zostavax completed Yes   Shingrix Completed?: Yes  Screening Tests Health Maintenance  Topic Date Due  . COVID-19 Vaccine (4 - Booster for Pfizer series)  11/01/2020  . COLONOSCOPY (Pts 45-56yrs Insurance coverage will need to be confirmed)  09/21/2023  . TETANUS/TDAP  08/24/2028  . INFLUENZA VACCINE  Completed  . Hepatitis C Screening  Completed  . PNA vac Low Risk Adult  Completed  .  HPV VACCINES  Aged Out    Health Maintenance  There are no preventive care reminders to display for this patient.  Colorectal cancer screening: Type of screening: Colonoscopy. Completed 09/20/13. Repeat every 10 years  Additional Screening:  Hepatitis C Screening:  Completed 04/19/18  Vision Screening: Recommended annual ophthalmology exams for early detection of glaucoma and other disorders of the eye. Is the patient up to date with their annual eye exam?  Yes  Who is the provider or what is the name of the office in which the patient attends annual eye exams? Dr Milford Cage  If pt is not established with a provider, would they like to be referred to a provider to establish care? No .   Dental Screening: Recommended annual dental exams for proper oral hygiene  Community Resource Referral / Chronic Care Management: CRR required this visit?  No   CCM required this visit?  No      Plan:     I have personally reviewed and noted the following in the patient's chart:   . Medical and social history . Use of alcohol, tobacco or illicit drugs  . Current medications and supplements . Functional ability and status . Nutritional status . Physical activity . Advanced directives . List of other physicians . Hospitalizations, surgeries, and ER visits in previous 12 months . Vitals . Screenings to include cognitive, depression, and falls . Referrals and appointments  In addition, I have reviewed and discussed with patient certain preventive protocols, quality metrics, and best practice recommendations. A written personalized care plan for preventive services as well as general preventive health recommendations were provided to patient.     Kyle Brace, LPN   11/25/9561   Nurse Notes: None

## 2020-10-16 NOTE — Patient Instructions (Signed)
Kyle [name] , Thank you for taking time to come for your Medicare Wellness Visit. I appreciate your ongoing commitment to your health goals. Please review the following plan we discussed and let me know if I can assist you in the future.   Screening recommendations/referrals: Colonoscopy: Done 09/20/13 Recommended yearly ophthalmology/optometry visit for glaucoma screening and checkup Recommended yearly dental visit for hygiene and checkup  Vaccinations: Influenza vaccine: Done 04/15/20 Pneumococcal vaccine: Up to date Tdap vaccine: Up to date Shingles vaccine: Completed 1/21 & 02/20/20 Covid-19: Completed 2/7, 3/3, & 05/03/20  Advanced directives: Please bring a copy of your health care power of attorney and living will to the office at your convenience.  Conditions/risks identified: None at this time  Next appointment: Follow up in one year for your annual wellness visit.   Preventive Care 24 Years and Older, Male Preventive care refers to lifestyle choices and visits with your health care provider that can promote health and wellness. What does preventive care include?  A yearly physical exam. This is also called an annual well check.  Dental exams once or twice a year.  Routine eye exams. Ask your health care provider how often you should have your eyes checked.  Personal lifestyle choices, including:  Daily care of your teeth and gums.  Regular physical activity.  Eating a healthy diet.  Avoiding tobacco and drug use.  Limiting alcohol use.  Practicing safe sex.  Taking low doses of aspirin every day.  Taking vitamin and mineral supplements as recommended by your health care provider. What happens during an annual well check? The services and screenings done by your health care provider during your annual well check will depend on your age, overall health, lifestyle risk factors, and family history of disease. Counseling  Your health care provider may ask you  questions about your:  Alcohol use.  Tobacco use.  Drug use.  Emotional well-being.  Home and relationship well-being.  Sexual activity.  Eating habits.  History of falls.  Memory and ability to understand (cognition).  Work and work Statistician. Screening  You may have the following tests or measurements:  Height, weight, and BMI.  Blood pressure.  Lipid and cholesterol levels. These may be checked every 5 years, or more frequently if you are over 75 years old.  Skin check.  Lung cancer screening. You may have this screening every year starting at age 22 if you have a 30-pack-year history of smoking and currently smoke or have quit within the past 15 years.  Fecal occult blood test (FOBT) of the stool. You may have this test every year starting at age 78.  Flexible sigmoidoscopy or colonoscopy. You may have a sigmoidoscopy every 5 years or a colonoscopy every 10 years starting at age 57.  Prostate cancer screening. Recommendations will vary depending on your family history and other risks.  Hepatitis C blood test.  Hepatitis B blood test.  Sexually transmitted disease (STD) testing.  Diabetes screening. This is done by checking your blood sugar (glucose) after you have not eaten for a while (fasting). You may have this done every 1-3 years.  Abdominal aortic aneurysm (AAA) screening. You may need this if you are a current or former smoker.  Osteoporosis. You may be screened starting at age 15 if you are at high risk. Talk with your health care provider about your test results, treatment options, and if necessary, the need for more tests. Vaccines  Your health care provider may recommend certain vaccines, such  as:  Influenza vaccine. This is recommended every year.  Tetanus, diphtheria, and acellular pertussis (Tdap, Td) vaccine. You may need a Td booster every 10 years.  Zoster vaccine. You may need this after age 27.  Pneumococcal 13-valent conjugate  (PCV13) vaccine. One dose is recommended after age 19.  Pneumococcal polysaccharide (PPSV23) vaccine. One dose is recommended after age 36. Talk to your health care provider about which screenings and vaccines you need and how often you need them. This information is not intended to replace advice given to you by your health care provider. Make sure you discuss any questions you have with your health care provider. Document Released: 08/16/2015 Document Revised: 04/08/2016 Document Reviewed: 05/21/2015 Elsevier Interactive Patient Education  2017 [employer] Prevention in the Home Falls can cause injuries. They can happen to people of all ages. There are many things you can do to make your home safe and to help prevent falls. What can I do on the outside of my home?  Regularly fix the edges of walkways and driveways and fix any cracks.  Remove anything that might make you trip as you walk through a door, such as a raised step or threshold.  Trim any bushes or trees on the path to your home.  Use bright outdoor lighting.  Clear any walking paths of anything that might make someone trip, such as rocks or tools.  Regularly check to see if handrails are loose or broken. Make sure that both sides of any steps have handrails.  Any raised decks and porches should have guardrails on the edges.  Have any leaves, snow, or ice cleared regularly.  Use sand or salt on walking paths during winter.  Clean up any spills in your garage right away. This includes oil or grease spills. What can I do in the bathroom?  Use night lights.  Install grab bars by the toilet and in the tub and shower. Do not use towel bars as grab bars.  Use non-skid mats or decals in the tub or shower.  If you need to sit down in the shower, use a plastic, non-slip stool.  Keep the floor dry. Clean up any water that spills on the floor as soon as it happens.  Remove soap buildup in the tub or shower  regularly.  Attach bath mats securely with double-sided non-slip rug tape.  Do not have throw rugs and other things on the floor that can make you trip. What can I do in the bedroom?  Use night lights.  Make sure that you have a light by your bed that is easy to reach.  Do not use any sheets or blankets that are too big for your bed. They should not hang down onto the floor.  Have a firm chair that has side arms. You can use this for support while you get dressed.  Do not have throw rugs and other things on the floor that can make you trip. What can I do in the kitchen?  Clean up any spills right away.  Avoid walking on wet floors.  Keep items that you use a lot in easy-to-reach places.  If you need to reach something above you, use a strong step stool that has a grab bar.  Keep electrical cords out of the way.  Do not use floor polish or wax that makes floors slippery. If you must use wax, use non-skid floor wax.  Do not have throw rugs and other things on the  floor that can make you trip. What can I do with my stairs?  Do not leave any items on the stairs.  Make sure that there are handrails on both sides of the stairs and use them. Fix handrails that are broken or loose. Make sure that handrails are as long as the stairways.  Check any carpeting to make sure that it is firmly attached to the stairs. Fix any carpet that is loose or worn.  Avoid having throw rugs at the top or bottom of the stairs. If you do have throw rugs, attach them to the floor with carpet tape.  Make sure that you have a light switch at the top of the stairs and the bottom of the stairs. If you do not have them, ask someone to add them for you. What else can I do to help prevent falls?  Wear shoes that:  Do not have high heels.  Have rubber bottoms.  Are comfortable and fit you well.  Are closed at the toe. Do not wear sandals.  If you use a stepladder:  Make sure that it is fully  opened. Do not climb a closed stepladder.  Make sure that both sides of the stepladder are locked into place.  Ask someone to hold it for you, if possible.  Clearly mark and make sure that you can see:  Any grab bars or handrails.  First and last steps.  Where the edge of each step is.  Use tools that help you move around (mobility aids) if they are needed. These include:  Canes.  Walkers.  Scooters.  Crutches.  Turn on the lights when you go into a dark area. Replace any light bulbs as soon as they burn out.  Set up your furniture so you have a clear path. Avoid moving your furniture around.  If any of your floors are uneven, fix them.  If there are any pets around you, be aware of where they are.  Review your medicines with your doctor. Some medicines can make you feel dizzy. This can increase your chance of falling. Ask your doctor what other things that you can do to help prevent falls. This information is not intended to replace advice given to you by your health care provider. Make sure you discuss any questions you have with your health care provider. Document Released: 05/16/2009 Document Revised: 12/26/2015 Document Reviewed: 08/24/2014 Elsevier Interactive Patient Education  2017 [employer].

## 2020-11-02 ENCOUNTER — Other Ambulatory Visit: Payer: Self-pay | Admitting: Cardiology

## 2020-11-04 ENCOUNTER — Encounter: Payer: Self-pay | Admitting: Family Medicine

## 2020-11-04 ENCOUNTER — Other Ambulatory Visit: Payer: Self-pay

## 2020-11-04 ENCOUNTER — Ambulatory Visit (INDEPENDENT_AMBULATORY_CARE_PROVIDER_SITE_OTHER): Payer: Medicare HMO | Admitting: Family Medicine

## 2020-11-04 VITALS — BP 152/70 | HR 89 | Temp 97.5°F | Wt 242.4 lb

## 2020-11-04 DIAGNOSIS — I10 Essential (primary) hypertension: Secondary | ICD-10-CM

## 2020-11-04 DIAGNOSIS — R7303 Prediabetes: Secondary | ICD-10-CM

## 2020-11-04 DIAGNOSIS — E039 Hypothyroidism, unspecified: Secondary | ICD-10-CM | POA: Diagnosis not present

## 2020-11-04 LAB — TSH: TSH: 3.4 u[IU]/mL (ref 0.35–4.50)

## 2020-11-04 LAB — HEMOGLOBIN A1C: Hgb A1c MFr Bld: 7.1 % — ABNORMAL HIGH (ref 4.6–6.5)

## 2020-11-04 LAB — T4, FREE: Free T4: 0.69 ng/dL (ref 0.60–1.60)

## 2020-11-04 NOTE — Patient Instructions (Signed)
Monitor blood pressure at home and be in touch if consistently > 140/90  Lose some weight  Let's plan on 3 month follow up to reassess BP.

## 2020-11-04 NOTE — Progress Notes (Signed)
Established Patient Office Visit  Subjective:  Patient ID: Kyle [name], male    DOB: [birth year]  Age: 75 y.o. MRN: [id number]  CC:  Chief Complaint  Patient presents with  . Follow-up    HPI Kyle [name] presents for medical follow-up.  He has history of hypothyroidism.  He had labs back in January with TSH 0.11.  At that point, was taking levothyroxine 25 mcg daily.  We had him stop the levothyroxine and is here today for follow-up.  Does not have any symptoms of tachycardia, diarrhea, tremors  Hypertension history.  He is on benazepril HCTZ.  Compliant with therapy.  Not checking blood pressure regularly at home but does have cuff.  No peripheral edema issues.  He has gained some weight this year and understands that is adversely affecting his blood pressure  History of hyperglycemia.  Last A1c 6.9%.  We discussed rechecking this today.  Denies any polyuria or polydipsia.  Past Medical History:  Diagnosis Date  . CAD (coronary artery disease) 2011  . Cardiac arrhythmia   . COPD (chronic obstructive pulmonary disease) ([place])   . GERD (gastroesophageal reflux disease)   . Hyperlipidemia   . Hypertension   . Myocardial infarction [facility]) 2011    Past Surgical History:  Procedure Laterality Date  . APPENDECTOMY    . CARDIAC CATHETERIZATION N/A 05/05/2016   Procedure: Left Heart Cath and Coronary Angiography;  Surgeon: Jerline Pain, MD;  Location: [facility];  Service: Cardiovascular;  Laterality: N/A;  . CORONARY ANGIOPLASTY WITH STENT PLACEMENT    . EYE SURGERY     lasix /BIL    Family History  Problem Relation Age of Onset  . Stroke Mother   . Stroke Father   . Multiple sclerosis Sister     Social History   Socioeconomic History  . Marital status: Married    Spouse name: Not on file  . Number of children: Not on file  . Years of education: Not on file  . Highest education level: Not on file  Occupational History  . Not on file  Tobacco Use  .  Smoking status: Former Smoker    Packs/day: 4.00    Years: 28.00    Pack years: 112.00    Types: Cigarettes    Quit date: 12/10/1993    Years since quitting: 26.9  . Smokeless tobacco: Never Used  Vaping Use  . Vaping Use: Never used  Substance and Sexual Activity  . Alcohol use: Yes    Alcohol/week: 5.0 standard drinks    Types: 5 Standard drinks or equivalent per week  . Drug use: No  . Sexual activity: Not on file  Other Topics Concern  . Not on file  Social History Narrative  . Not on file   Social Determinants of Health   Financial Resource Strain: Low Risk   . Difficulty of Paying Living Expenses: Not hard at all  Food Insecurity: No Food Insecurity  . Worried About Charity fundraiser in the Last Year: Never true  . Ran Out of Food in the Last Year: Never true  Transportation Needs: No Transportation Needs  . Lack of Transportation (Medical): No  . Lack of Transportation (Non-Medical): No  Physical Activity: Insufficiently Active  . Days of Exercise per Week: 3 days  . Minutes of Exercise per Session: 30 min  Stress: No Stress Concern Present  . Feeling of Stress : Not at all  Social Connections: Socially Integrated  .  Frequency of Communication with Friends and Family: Twice a week  . Frequency of Social Gatherings with Friends and Family: Twice a week  . Attends Religious Services: 1 to 4 times per year  . Active Member of Clubs or Organizations: Yes  . Attends Archivist Meetings: 1 to 4 times per year  . Marital Status: Married  Human resources officer Violence: Not At Risk  . Fear of Current or Ex-Partner: No  . Emotionally Abused: No  . Physically Abused: No  . Sexually Abused: No    Outpatient Medications Prior to Visit  Medication Sig Dispense Refill  . albuterol (VENTOLIN HFA) 108 (90 Base) MCG/ACT inhaler Inhale 2 puffs into the lungs every 6 (six) hours as needed for wheezing or shortness of breath. 8 g 12  . aspirin 81 MG tablet Take 81 mg by  mouth daily.    Marland Kitchen atorvastatin (LIPITOR) 40 MG tablet TAKE ONE TABLET BY MOUTH DAILY AT 6PM 90 tablet 3  . benazepril-hydrochlorthiazide (LOTENSIN HCT) 20-25 MG tablet Take 0.5 tablets by mouth daily. 45 tablet 3  . cetirizine (ZYRTEC) 10 MG tablet Take 10 mg by mouth daily.    . Cyanocobalamin (B-12 PO) Take 1 tablet by mouth daily.    . Fluticasone-Umeclidin-Vilant (TRELEGY ELLIPTA) 100-62.5-25 MCG/INH AEPB Inhale 1 puff into the lungs daily. 60 each 12  . Multiple Vitamin (MULTIVITAMIN) tablet Take 1 tablet by mouth daily.    Marland Kitchen omeprazole (PRILOSEC OTC) 20 MG tablet Take 20 mg by mouth daily as needed (heartburn).     . valACYclovir (VALTREX) 1000 MG tablet 1 g.    . levothyroxine (SYNTHROID) 25 MCG tablet Take 1 tablet (25 mcg total) by mouth daily before breakfast. 90 tablet 0   No facility-administered medications prior to visit.    No Known Allergies  ROS Review of Systems  Constitutional: Negative for fatigue and unexpected weight change.  Eyes: Negative for visual disturbance.  Respiratory: Negative for cough, chest tightness and shortness of breath.   Cardiovascular: Negative for chest pain, palpitations and leg swelling.  Endocrine: Negative for polydipsia and polyuria.  Neurological: Negative for dizziness, syncope, weakness, light-headedness and headaches.      Objective:    Physical Exam Vitals reviewed.  Constitutional:      Appearance: Normal appearance.  Cardiovascular:     Rate and Rhythm: Normal rate and regular rhythm.  Pulmonary:     Effort: Pulmonary effort is normal.     Breath sounds: Normal breath sounds.  Musculoskeletal:     Right lower leg: No edema.  Neurological:     Mental Status: He is alert.     BP (!) 152/70 (BP Location: Left Arm, Patient Position: Sitting, Cuff Size: Normal)   Pulse 89   Temp (!) 97.5 F (36.4 C) (Oral)   Wt 242 lb 6.4 oz (110 kg)   SpO2 95%   BMI 33.81 kg/m  Wt Readings from Last 3 Encounters:  11/04/20 242 lb  6.4 oz (110 kg)  08/09/20 256 lb (116.1 kg)  05/24/20 234 lb 3.2 oz (106.2 kg)     Health Maintenance Due  Topic Date Due  . COVID-19 Vaccine (4 - Booster for Pfizer series) 11/01/2020    There are no preventive care reminders to display for this patient.  Lab Results  Component Value Date   TSH 0.11 (L) 08/09/2020   Lab Results  Component Value Date   WBC 4.9 04/19/2018   HGB 10.5 (L) 04/19/2018   HCT 33.0 (L)  04/19/2018   MCV 82.0 04/19/2018   PLT 223.0 04/19/2018   Lab Results  Component Value Date   NA 139 08/09/2020   K 4.2 08/09/2020   CO2 30 08/09/2020   GLUCOSE 97 08/09/2020   BUN 18 08/09/2020   CREATININE 1.08 08/09/2020   BILITOT 0.4 08/09/2020   ALKPHOS 82 08/09/2020   AST 28 08/09/2020   ALT 31 08/09/2020   PROT 7.2 08/09/2020   ALBUMIN 4.5 08/09/2020   CALCIUM 9.7 08/09/2020   GFR 68.03 08/09/2020   Lab Results  Component Value Date   CHOL 103 08/09/2020   Lab Results  Component Value Date   HDL 23.30 (L) 08/09/2020   Lab Results  Component Value Date   LDLCALC 43 08/09/2020   Lab Results  Component Value Date   TRIG 184.0 (H) 08/09/2020   Lab Results  Component Value Date   CHOLHDL 4 08/09/2020   Lab Results  Component Value Date   HGBA1C 6.9 (H) 08/09/2020      Assessment & Plan:   #1 hypothyroidism.  TSH recently over replaced.  Now off thyroid medication  -Recheck TSH and free T4 today  #2 hypertension-poorly controlled by today's reading.  Recent weight gain which is likely exacerbating  -We discussed additional medications such as calcium channel blocker versus 49-month trial of monitoring along with weight loss and stepping up exercise and he prefers the latter  #3 hyperglycemia -Recheck A1c today  No orders of the defined types were placed in this encounter.   Follow-up: Return in about 3 months (around 02/03/2021).    Carolann Littler, MD

## 2020-11-07 ENCOUNTER — Encounter: Payer: Self-pay | Admitting: Family Medicine

## 2020-11-11 ENCOUNTER — Ambulatory Visit: Payer: Medicare HMO | Admitting: Cardiology

## 2020-12-09 ENCOUNTER — Other Ambulatory Visit: Payer: Self-pay

## 2020-12-09 ENCOUNTER — Encounter: Payer: Self-pay | Admitting: Cardiology

## 2020-12-09 ENCOUNTER — Ambulatory Visit: Payer: Medicare HMO | Admitting: Cardiology

## 2020-12-09 VITALS — BP 124/70 | HR 55 | Ht 71.0 in | Wt 240.0 lb

## 2020-12-09 DIAGNOSIS — J449 Chronic obstructive pulmonary disease, unspecified: Secondary | ICD-10-CM

## 2020-12-09 DIAGNOSIS — R0602 Shortness of breath: Secondary | ICD-10-CM

## 2020-12-09 DIAGNOSIS — I10 Essential (primary) hypertension: Secondary | ICD-10-CM

## 2020-12-09 DIAGNOSIS — E7800 Pure hypercholesterolemia, unspecified: Secondary | ICD-10-CM | POA: Diagnosis not present

## 2020-12-09 DIAGNOSIS — I2510 Atherosclerotic heart disease of native coronary artery without angina pectoris: Secondary | ICD-10-CM

## 2020-12-09 NOTE — Patient Instructions (Addendum)
Medication Instructions:  The current medical regimen is effective;  continue present plan and medications.  *If you need a refill on your cardiac medications before your next appointment, please call your pharmacy*  Testing/Procedures: Your physician has requested that you have an echocardiogram. Echocardiography is a painless test that uses sound waves to create images of your heart. It provides your doctor with information about the size and shape of your heart and how well your heart's chambers and valves are working. This procedure takes approximately one hour. There are no restrictions for this procedure.  Follow-Up: At [facility], you and your health needs are our priority.  As part of our continuing mission to provide you with exceptional heart care, we have created designated Provider Care Teams.  These Care Teams include your primary Cardiologist (physician) and Advanced Practice Providers (APPs -  Physician Assistants and Nurse Practitioners) who all work together to provide you with the care you need, when you need it.  We recommend signing up for the patient portal called "MyChart".  Sign up information is provided on this After Visit Summary.  MyChart is used to connect with patients for Virtual Visits (Telemedicine).  Patients are able to view lab/test results, encounter notes, upcoming appointments, etc.  Non-urgent messages can be sent to your provider as well.   To learn more about what you can do with MyChart, go to [email or web address].    Your next appointment:   12 month(s)  The format for your next appointment:   In Person  Provider:   Mark Skains, MD   Thank you for choosing [facility]!!      

## 2020-12-09 NOTE — Progress Notes (Signed)
Cardiology Office Note:    Date:  12/09/2020   ID:  Kyle [name], DOB [birth year], MRN [id number]  PCP:  Eulas Post, MD   [facility] Providers Cardiologist:  Candee Furbish, MD     Referring MD: Jerline Pain, MD     History of Present Illness:    Kyle [name] is a 75 y.o. male here for the follow-up of coronary artery disease, prior inferior, RCA ST elevation myocardial infarction in 2011.  Prior cardiac catheterization in 05/05/2016 showed patent stent in the right coronary artery.  Battles with COPD, difficult with mobility.  Plays golf less often.  Enjoys his his wife's cooking he is stated in the past, different soups during the wintertime including "swamp soup "which has collards 3 types of beans and a thin sausage.  Today, he is accompanied by his wife. He reports that he continues to have shortness of breath. He uses an inhaler as needed. He does complete physical activity(walking), but no formal exercise. He is currently working towards walking one mile three times per week. He has some exertional shortness of breath but he suspects that this is secondary to his COPD and being out of shape. He notes that COVID-19 has impeded his process of working out. His wife reports that they eat healthy, they he eat more grilled foods. He denies any exertional chest pain, tightness, or pressure. He denies any lightheadedness, PND, orthopnea, or headaches. He has no LE edema or blood in stool.   Past Medical History:  Diagnosis Date  . CAD (coronary artery disease) 2011  . Cardiac arrhythmia   . COPD (chronic obstructive pulmonary disease) ([place])   . GERD (gastroesophageal reflux disease)   . Hyperlipidemia   . Hypertension   . Myocardial infarction [facility]) 2011    Past Surgical History:  Procedure Laterality Date  . APPENDECTOMY    . CARDIAC CATHETERIZATION N/A 05/05/2016   Procedure: Left Heart Cath and Coronary Angiography;  Surgeon: Jerline Pain, MD;  Location: [place] CV [facility];  Service: Cardiovascular;  Laterality: N/A;  . CORONARY ANGIOPLASTY WITH STENT PLACEMENT    . EYE SURGERY     lasix /BIL    Current Medications: Current Meds  Medication Sig  . albuterol (VENTOLIN HFA) 108 (90 Base) MCG/ACT inhaler Inhale 2 puffs into the lungs every 6 (six) hours as needed for wheezing or shortness of breath.  Marland Kitchen aspirin 81 MG tablet Take 81 mg by mouth daily.  Marland Kitchen atorvastatin (LIPITOR) 40 MG tablet TAKE ONE TABLET BY MOUTH ONE TIME DAILY AT 6 IN THE EVENING  . benazepril-hydrochlorthiazide (LOTENSIN HCT) 20-25 MG tablet Take 0.5 tablets by mouth daily.  . cetirizine (ZYRTEC) 10 MG tablet Take 10 mg by mouth daily.  . Cyanocobalamin (B-12 PO) Take 1 tablet by mouth daily.  . Fluticasone-Umeclidin-Vilant (TRELEGY ELLIPTA) 100-62.5-25 MCG/INH AEPB Inhale 1 puff into the lungs daily.  . Multiple Vitamin (MULTIVITAMIN) tablet Take 1 tablet by mouth daily.  Marland Kitchen omeprazole (PRILOSEC OTC) 20 MG tablet Take 20 mg by mouth daily as needed (heartburn).   . valACYclovir (VALTREX) 1000 MG tablet 1 g.     Allergies:   Patient has no known allergies.   Social History   Socioeconomic History  . Marital status: Married    Spouse name: Not on file  . Number of children: Not on file  . Years of education: Not on file  . Highest education level: Not on file  Occupational History  . Not  on file  Tobacco Use  . Smoking status: Former Smoker    Packs/day: 4.00    Years: 28.00    Pack years: 112.00    Types: Cigarettes    Quit date: 12/10/1993    Years since quitting: 27.0  . Smokeless tobacco: Never Used  Vaping Use  . Vaping Use: Never used  Substance and Sexual Activity  . Alcohol use: Yes    Alcohol/week: 5.0 standard drinks    Types: 5 Standard drinks or equivalent per week  . Drug use: No  . Sexual activity: Not on file  Other Topics Concern  . Not on file  Social History Narrative  . Not on file   Social Determinants of Health   Financial  Resource Strain: Low Risk   . Difficulty of Paying Living Expenses: Not hard at all  Food Insecurity: No Food Insecurity  . Worried About Charity fundraiser in the Last Year: Never true  . Ran Out of Food in the Last Year: Never true  Transportation Needs: No Transportation Needs  . Lack of Transportation (Medical): No  . Lack of Transportation (Non-Medical): No  Physical Activity: Insufficiently Active  . Days of Exercise per Week: 3 days  . Minutes of Exercise per Session: 30 min  Stress: No Stress Concern Present  . Feeling of Stress : Not at all  Social Connections: Socially Integrated  . Frequency of Communication with Friends and Family: Twice a week  . Frequency of Social Gatherings with Friends and Family: Twice a week  . Attends Religious Services: 1 to 4 times per year  . Active Member of Clubs or Organizations: Yes  . Attends Archivist Meetings: 1 to 4 times per year  . Marital Status: Married     Family History: The patient's family history includes Multiple sclerosis in his sister; Stroke in his father and mother.  ROS:   Please see the history of present illness. All other systems reviewed and are negative.  EKGs/Labs/Other Studies Reviewed:    The following studies were reviewed today: 05/05/16 LEFT HEART CATH AND CORONARY ANGIO- The left ventricular systolic function is normal. LV end diastolic pressure is normal. There is no aortic valve stenosis. There is no mitral valve regurgitation. Previously placed right coronary artery mid stent widely patent. 30-40% distal RCA stenosis Minor luminal irregularities in left system.   EKG:   12/09/20-sinus bradycardia. Rate:55  Recent Labs: 08/09/2020: ALT 31; BUN 18; Creatinine, Ser 1.08; Potassium 4.2; Sodium 139 11/04/2020: TSH 3.40  Recent Lipid Panel    Component Value Date/Time   CHOL 103 08/09/2020 1447   TRIG 184.0 (H) 08/09/2020 1447   HDL 23.30 (L) 08/09/2020 1447   CHOLHDL 4 08/09/2020 1447    VLDL 36.8 08/09/2020 1447   LDLCALC 43 08/09/2020 1447     Risk Assessment/Calculations:      Physical Exam:    VS:  BP 124/70 (BP Location: Left Arm, Patient Position: Sitting, Cuff Size: Normal)   Pulse (!) 55   Ht 5\' 11"  (1.803 m)   Wt 240 lb (108.9 kg)   SpO2 95%   BMI 33.47 kg/m     Wt Readings from Last 3 Encounters:  12/09/20 240 lb (108.9 kg)  11/04/20 242 lb 6.4 oz (110 kg)  08/09/20 256 lb (116.1 kg)     GEN: Well nourished, well developed in no acute distress HEENT: Normal NECK: No JVD; No carotid bruits LYMPHATICS: No lymphadenopathy CARDIAC: RRR, no murmurs, rubs, gallops RESPIRATORY:  Clear to auscultation without rales, wheezing or rhonchi  ABDOMEN: Soft, non-tender, non-distended MUSCULOSKELETAL:  No edema; No deformity  SKIN: Warm and dry NEUROLOGIC:  Alert and oriented x 3 PSYCHIATRIC:  Normal affect   ASSESSMENT:    1. Coronary artery disease involving native coronary artery of native heart without angina pectoris   2. Essential hypertension   3. COPD mixed type ([place])   4. Primary hypertension   5. Pure hypercholesterolemia   6. Shortness of breath    PLAN:    In order of problems listed above:  Coronary artery disease - Cardiac catheterization 2017 showed patent right coronary artery stent.  No changes made.  Continue with goal-directed therapy, aspirin, high intensity statin.  Essential hypertension - Occasionally elevated mildly but overall well controlled.  Medications reviewed  Hyperlipidemia - On atorvastatin 40 mg.  LDL previously 56, no myalgias.  Current LDL 43.  Excellent.  COPD/obstructive sleep apnea - Has seen Dr. Annamaria Boots in the past with pulmonary.  Continuing to monitor activity. -Encouraged for continued exercise.  Conditioning efforts.  Weight loss.  Encouraged him to ask questions to Dr. Annamaria Boots about treatment strategies.  He also sees the [place] [facility].  He is trying to get back to the gym and walks the track.  Excellent.   This is challenging for him however.  1 year follow-up    Medication Adjustments/Labs and Tests Ordered: Current medicines are reviewed at length with the patient today.  Concerns regarding medicines are outlined above.  Orders Placed This Encounter  Procedures  . EKG 12-Lead  . ECHOCARDIOGRAM COMPLETE   No orders of the defined types were placed in this encounter.   Patient Instructions  Medication Instructions:  The current medical regimen is effective;  continue present plan and medications.  *If you need a refill on your cardiac medications before your next appointment, please call your pharmacy*  Testing/Procedures: Your physician has requested that you have an echocardiogram. Echocardiography is a painless test that uses sound waves to create images of your heart. It provides your doctor with information about the size and shape of your heart and how well your heart's chambers and valves are working. This procedure takes approximately one hour. There are no restrictions for this procedure.  Follow-Up: At [facility], you and your health needs are our priority.  As part of our continuing mission to provide you with exceptional heart care, we have created designated Provider Care Teams.  These Care Teams include your primary Cardiologist (physician) and Advanced Practice Providers (APPs -  Physician Assistants and Nurse Practitioners) who all work together to provide you with the care you need, when you need it.  We recommend signing up for the patient portal called "MyChart".  Sign up information is provided on this After Visit Summary.  MyChart is used to connect with patients for Virtual Visits (Telemedicine).  Patients are able to view lab/test results, encounter notes, upcoming appointments, etc.  Non-urgent messages can be sent to your provider as well.   To learn more about what you can do with MyChart, go to [email or web address].    Your next appointment:   12  month(s)  The format for your next appointment:   In Person  Provider:   Candee Furbish, MD   Thank you for choosing [facility]!!          I,Alexis Bryant,acting as a scribe for Candee Furbish, MD.,have documented all relevant documentation on the behalf of Candee Furbish, MD,as directed by  Candee Furbish, MD while in the presence of Candee Furbish, MD.  I, Candee Furbish, MD, have reviewed all documentation for this visit. The documentation on 12/09/20 for the exam, diagnosis, procedures, and orders are all accurate and complete.  Signed, Candee Furbish, MD  12/09/2020 8:45 AM    [place]

## 2020-12-09 NOTE — Progress Notes (Deleted)
Cardiology Office Note:    Date:  12/09/2020   ID:  [name], DOB 06-Mar-1946, MRN [id number]  PCP:  Eulas Post, MD   [facility] Providers Cardiologist:  Candee Furbish, MD     Referring MD: Eulas Post, MD     History of Present Illness:    Kyle [name] is a 75 y.o. male here for the follow-up of coronary artery disease, prior inferior, RCA ST elevation myocardial infarction in 2011.  Prior cardiac catheterization in 05/05/2016 showed patent stent in the right coronary artery.  Battles with COPD, difficult with mobility.  Plays golf less often.  Enjoys his his wife's cooking he is stated in the past, different soups during the wintertime including "swamp soup "which has collards 3 types of beans and a thin sausage.  Past Medical History:  Diagnosis Date  . CAD (coronary artery disease) 2011  . Cardiac arrhythmia   . COPD (chronic obstructive pulmonary disease) ([place])   . GERD (gastroesophageal reflux disease)   . Hyperlipidemia   . Hypertension   . Myocardial infarction [facility]) 2011    Past Surgical History:  Procedure Laterality Date  . APPENDECTOMY    . CARDIAC CATHETERIZATION N/A 05/05/2016   Procedure: Left Heart Cath and Coronary Angiography;  Surgeon: Jerline Pain, MD;  Location: [place] CV LAB;  Service: Cardiovascular;  Laterality: N/A;  . CORONARY ANGIOPLASTY WITH STENT PLACEMENT    . EYE SURGERY     lasix /BIL    Current Medications: No outpatient medications have been marked as taking for the 12/09/20 encounter (Appointment) with Jerline Pain, MD.     Allergies:   Patient has no known allergies.   Social History   Socioeconomic History  . Marital status: Married    Spouse name: Not on file  . Number of children: Not on file  . Years of education: Not on file  . Highest education level: Not on file  Occupational History  . Not on file  Tobacco Use  . Smoking status: Former Smoker    Packs/day: 4.00    Years: 28.00     Pack years: 112.00    Types: Cigarettes    Quit date: 12/10/1993    Years since quitting: 27.0  . Smokeless tobacco: Never Used  Vaping Use  . Vaping Use: Never used  Substance and Sexual Activity  . Alcohol use: Yes    Alcohol/week: 5.0 standard drinks    Types: 5 Standard drinks or equivalent per week  . Drug use: No  . Sexual activity: Not on file  Other Topics Concern  . Not on file  Social History Narrative  . Not on file   Social Determinants of Health   Financial Resource Strain: Low Risk   . Difficulty of Paying Living Expenses: Not hard at all  Food Insecurity: No Food Insecurity  . Worried About Charity fundraiser in the Last Year: Never true  . Ran Out of Food in the Last Year: Never true  Transportation Needs: No Transportation Needs  . Lack of Transportation (Medical): No  . Lack of Transportation (Non-Medical): No  Physical Activity: Insufficiently Active  . Days of Exercise per Week: 3 days  . Minutes of Exercise per Session: 30 min  Stress: No Stress Concern Present  . Feeling of Stress : Not at all  Social Connections: Socially Integrated  . Frequency of Communication with Friends and Family: Twice a week  . Frequency of Social Gatherings with  Friends and Family: Twice a week  . Attends Religious Services: 1 to 4 times per year  . Active Member of Clubs or Organizations: Yes  . Attends Archivist Meetings: 1 to 4 times per year  . Marital Status: Married     Family History: The patient's family history includes Multiple sclerosis in his sister; Stroke in his father and mother.  ROS:   Please see the history of present illness.     All other systems reviewed and are negative.  EKGs/Labs/Other Studies Reviewed:    The following studies were reviewed today: ***  EKG:  EKG is *** ordered today.  The ekg ordered today demonstrates ***  Recent Labs: 08/09/2020: ALT 31; BUN 18; Creatinine, Ser 1.08; Potassium 4.2; Sodium 139 11/04/2020: TSH  3.40  Recent Lipid Panel    Component Value Date/Time   CHOL 103 08/09/2020 1447   TRIG 184.0 (H) 08/09/2020 1447   HDL 23.30 (L) 08/09/2020 1447   CHOLHDL 4 08/09/2020 1447   VLDL 36.8 08/09/2020 1447   LDLCALC 43 08/09/2020 1447     Risk Assessment/Calculations:   {Does this patient have ATRIAL FIBRILLATION?:[phone]}   Physical Exam:    VS:  There were no vitals taken for this visit.    Wt Readings from Last 3 Encounters:  11/04/20 242 lb 6.4 oz (110 kg)  08/09/20 256 lb (116.1 kg)  05/24/20 234 lb 3.2 oz (106.2 kg)     GEN: *** Well nourished, well developed in no acute distress HEENT: Normal NECK: No JVD; No carotid bruits LYMPHATICS: No lymphadenopathy CARDIAC: ***RRR, no murmurs, rubs, gallops RESPIRATORY:  Clear to auscultation without rales, wheezing or rhonchi  ABDOMEN: Soft, non-tender, non-distended MUSCULOSKELETAL:  No edema; No deformity  SKIN: Warm and dry NEUROLOGIC:  Alert and oriented x 3 PSYCHIATRIC:  Normal affect   ASSESSMENT:    No diagnosis found. PLAN:    In order of problems listed above:  Coronary artery disease - Cardiac catheterization 2017 showed patent right coronary artery stent.  No changes made.  Continue with goal-directed therapy, aspirin, high intensity statin.  Essential hypertension - Occasionally elevated mildly but overall well controlled.  Medications reviewed  Hyperlipidemia - On atorvastatin 40 mg.  LDL previously 56, no myalgias.  COPD/obstructive sleep apnea - Has seen Dr. Annamaria Boots in the past with pulmonary.  Continuing to monitor activity.   {Are you ordering a CV Procedure (e.g. stress test, cath, DCCV, TEE, etc)?   Press F2        :[id number]}    Medication Adjustments/Labs and Tests Ordered: Current medicines are reviewed at length with the patient today.  Concerns regarding medicines are outlined above.  No orders of the defined types were placed in this encounter.  No orders of the defined types were  placed in this encounter.   There are no Patient Instructions on file for this visit.   Signed, Candee Furbish, MD  12/09/2020 6:43 AM    [facility]

## 2020-12-12 ENCOUNTER — Encounter: Payer: Self-pay | Admitting: Family Medicine

## 2020-12-12 ENCOUNTER — Telehealth: Payer: Self-pay | Admitting: Family Medicine

## 2020-12-12 NOTE — Telephone Encounter (Signed)
The triage nurse called and wanted to inform  Dr Elease Hashimoto that the patient has  a risk of COPD which may cause him to have complication of  covid.

## 2020-12-13 ENCOUNTER — Telehealth: Payer: Medicare HMO | Admitting: Family Medicine

## 2020-12-13 ENCOUNTER — Encounter: Payer: Self-pay | Admitting: Family Medicine

## 2020-12-13 ENCOUNTER — Telehealth: Payer: Self-pay

## 2020-12-13 ENCOUNTER — Telehealth (INDEPENDENT_AMBULATORY_CARE_PROVIDER_SITE_OTHER): Payer: Medicare HMO | Admitting: Family Medicine

## 2020-12-13 ENCOUNTER — Other Ambulatory Visit: Payer: Self-pay

## 2020-12-13 ENCOUNTER — Other Ambulatory Visit (HOSPITAL_BASED_OUTPATIENT_CLINIC_OR_DEPARTMENT_OTHER): Payer: Self-pay

## 2020-12-13 DIAGNOSIS — J449 Chronic obstructive pulmonary disease, unspecified: Secondary | ICD-10-CM

## 2020-12-13 DIAGNOSIS — U071 COVID-19: Secondary | ICD-10-CM | POA: Diagnosis not present

## 2020-12-13 MED ORDER — PREDNISONE 10 MG PO TABS
ORAL_TABLET | ORAL | 0 refills | Status: DC
  Filled 2020-12-13: qty 20, 12d supply, fill #0

## 2020-12-13 MED ORDER — PAXLOVID 20 X 150 MG & 10 X 100MG PO TBPK
3.0000 | ORAL_TABLET | Freq: Two times a day (BID) | ORAL | 0 refills | Status: DC
  Filled 2020-12-13: qty 30, 5d supply, fill #0

## 2020-12-13 NOTE — Assessment & Plan Note (Signed)
pred taper  con't inhalers  Call back if symptoms worsen

## 2020-12-13 NOTE — Progress Notes (Signed)
Virtual telephone visit    Virtual Visit via Telephone Note   This visit type was conducted due to national recommendations for restrictions regarding the COVID-19 Pandemic (e.g. social distancing) in an effort to limit this patient's exposure and mitigate transmission in our community. Due to his co-morbid illnesses, this patient is at least at moderate risk for complications without adequate follow up. This format is felt to be most appropriate for this patient at this time. The patient did not have access to video technology or had technical difficulties with video requiring transitioning to audio format only (telephone). Physical exam was limited to content and character of the telephone converstion. Rogene HoustonHeather Stevenaon was able to get the patient set up on a telephone visit.  Video connection was lost at <50% of the duration of the visit, at which time the remainder of the visit was completed via audio only.  Patient location: home with wife Patient and provider in visit Provider location: home   I discussed the limitations of evaluation and management by telemedicine and the availability of in person appointments. The patient expressed understanding and agreed to proceed.   Visit Date: 12/13/2020  Today's healthcare provider: Donato SchultzYvonne R Lowne Chase, DO     Subjective:    Patient ID: Chucky MayAlvin L Bourgoin, male    DOB: [birth year], 75 y.o.   MRN: [id number]  No chief complaint on file.   HPI Patient is in today for + covid test Thursday---- pt c/o sore throat , tired , no energy and head  Is full --- all the symptoms started yesterday    Hx of copd so he is concerned he will get worse over the weekend  Past Medical History:  Diagnosis Date  . CAD (coronary artery disease) 2011  . Cardiac arrhythmia   . COPD (chronic obstructive pulmonary disease) (HCC)   . GERD (gastroesophageal reflux disease)   . Hyperlipidemia   . Hypertension   . Myocardial infarction [facility](HCC) 2011    Past  Surgical History:  Procedure Laterality Date  . APPENDECTOMY    . CARDIAC CATHETERIZATION N/A 05/05/2016   Procedure: Left Heart Cath and Coronary Angiography;  Surgeon: Jake BatheMark C Skains, MD;  Location: [facility] INVASIVE CV [facility];  Service: Cardiovascular;  Laterality: N/A;  . CORONARY ANGIOPLASTY WITH STENT PLACEMENT    . EYE SURGERY     lasix /BIL    Family History  Problem Relation Age of Onset  . Stroke Mother   . Stroke Father   . Multiple sclerosis Sister     Social History   Socioeconomic History  . Marital status: Married    Spouse name: Not on file  . Number of children: Not on file  . Years of education: Not on file  . Highest education level: Not on file  Occupational History  . Not on file  Tobacco Use  . Smoking status: Former Smoker    Packs/day: 4.00    Years: 28.00    Pack years: 112.00    Types: Cigarettes    Quit date: 12/10/1993    Years since quitting: 27.0  . Smokeless tobacco: Never Used  Vaping Use  . Vaping Use: Never used  Substance and Sexual Activity  . Alcohol use: Yes    Alcohol/week: 5.0 standard drinks    Types: 5 Standard drinks or equivalent per week  . Drug use: No  . Sexual activity: Not on file  Other Topics Concern  . Not on file  Social History Narrative  .  Not on file   Social Determinants of Health   Financial Resource Strain: Low Risk   . Difficulty of Paying Living Expenses: Not hard at all  Food Insecurity: No Food Insecurity  . Worried About Charity fundraiser in the Last Year: Never true  . Ran Out of Food in the Last Year: Never true  Transportation Needs: No Transportation Needs  . Lack of Transportation (Medical): No  . Lack of Transportation (Non-Medical): No  Physical Activity: Insufficiently Active  . Days of Exercise per Week: 3 days  . Minutes of Exercise per Session: 30 min  Stress: No Stress Concern Present  . Feeling of Stress : Not at all  Social Connections: Socially Integrated  . Frequency of Communication  with Friends and Family: Twice a week  . Frequency of Social Gatherings with Friends and Family: Twice a week  . Attends Religious Services: 1 to 4 times per year  . Active Member of Clubs or Organizations: Yes  . Attends Archivist Meetings: 1 to 4 times per year  . Marital Status: Married  Human resources officer Violence: Not At Risk  . Fear of Current or Ex-Partner: No  . Emotionally Abused: No  . Physically Abused: No  . Sexually Abused: No    Outpatient Medications Prior to Visit  Medication Sig Dispense Refill  . albuterol (VENTOLIN HFA) 108 (90 Base) MCG/ACT inhaler Inhale 2 puffs into the lungs every 6 (six) hours as needed for wheezing or shortness of breath. 8 g 12  . aspirin 81 MG tablet Take 81 mg by mouth daily.    Marland Kitchen atorvastatin (LIPITOR) 40 MG tablet TAKE ONE TABLET BY MOUTH ONE TIME DAILY AT 6 IN THE EVENING 90 tablet 2  . benazepril-hydrochlorthiazide (LOTENSIN HCT) 20-25 MG tablet Take 0.5 tablets by mouth daily. 45 tablet 3  . cetirizine (ZYRTEC) 10 MG tablet Take 10 mg by mouth daily.    . Cyanocobalamin (B-12 PO) Take 1 tablet by mouth daily.    . Fluticasone-Umeclidin-Vilant (TRELEGY ELLIPTA) 100-62.5-25 MCG/INH AEPB Inhale 1 puff into the lungs daily. 60 each 12  . Multiple Vitamin (MULTIVITAMIN) tablet Take 1 tablet by mouth daily.    Marland Kitchen omeprazole (PRILOSEC OTC) 20 MG tablet Take 20 mg by mouth daily as needed (heartburn).     . valACYclovir (VALTREX) 1000 MG tablet 1 g.     No facility-administered medications prior to visit.    No Known Allergies  Review of Systems  Constitutional: Negative for chills, fever and malaise/fatigue.  HENT: Positive for congestion, ear pain and sore throat. Negative for hearing loss.   Eyes: Negative for discharge.  Respiratory: Positive for cough and shortness of breath. Negative for sputum production.   Cardiovascular: Negative for chest pain, palpitations and leg swelling.  Gastrointestinal: Negative for abdominal  pain, blood in stool, constipation, diarrhea, heartburn, nausea and vomiting.  Genitourinary: Negative for dysuria, frequency, hematuria and urgency.  Musculoskeletal: Negative for back pain, falls and myalgias.  Skin: Negative for rash.  Neurological: Negative for dizziness, sensory change, loss of consciousness, weakness and headaches.  Endo/Heme/Allergies: Negative for environmental allergies. Does not bruise/bleed easily.  Psychiatric/Behavioral: Negative for depression and suicidal ideas. The patient is not nervous/anxious and does not have insomnia.        Objective:    Physical Exam Vitals and nursing note reviewed.  Pulmonary:     Effort: Pulmonary effort is normal.  Psychiatric:        Mood and Affect: Mood normal.  Behavior: Behavior normal.        Thought Content: Thought content normal.        Judgment: Judgment normal.     There were no vitals taken for this visit. Wt Readings from Last 3 Encounters:  12/09/20 240 lb (108.9 kg)  11/04/20 242 lb 6.4 oz (110 kg)  08/09/20 256 lb (116.1 kg)    Diabetic Foot Exam - Simple   No data filed    Lab Results  Component Value Date   WBC 4.9 04/19/2018   HGB 10.5 (L) 04/19/2018   HCT 33.0 (L) 04/19/2018   PLT 223.0 04/19/2018   GLUCOSE 97 08/09/2020   CHOL 103 08/09/2020   TRIG 184.0 (H) 08/09/2020   HDL 23.30 (L) 08/09/2020   LDLCALC 43 08/09/2020   ALT 31 08/09/2020   AST 28 08/09/2020   NA 139 08/09/2020   K 4.2 08/09/2020   CL 103 08/09/2020   CREATININE 1.08 08/09/2020   BUN 18 08/09/2020   CO2 30 08/09/2020   TSH 3.40 11/04/2020   PSA 0.44 04/19/2018   INR 1.1 04/27/2016   HGBA1C 7.1 (H) 11/04/2020    Lab Results  Component Value Date   TSH 3.40 11/04/2020   Lab Results  Component Value Date   WBC 4.9 04/19/2018   HGB 10.5 (L) 04/19/2018   HCT 33.0 (L) 04/19/2018   MCV 82.0 04/19/2018   PLT 223.0 04/19/2018   Lab Results  Component Value Date   NA 139 08/09/2020   K 4.2  08/09/2020   CO2 30 08/09/2020   GLUCOSE 97 08/09/2020   BUN 18 08/09/2020   CREATININE 1.08 08/09/2020   BILITOT 0.4 08/09/2020   ALKPHOS 82 08/09/2020   AST 28 08/09/2020   ALT 31 08/09/2020   PROT 7.2 08/09/2020   ALBUMIN 4.5 08/09/2020   CALCIUM 9.7 08/09/2020   GFR 68.03 08/09/2020   Lab Results  Component Value Date   CHOL 103 08/09/2020   Lab Results  Component Value Date   HDL 23.30 (L) 08/09/2020   Lab Results  Component Value Date   LDLCALC 43 08/09/2020   Lab Results  Component Value Date   TRIG 184.0 (H) 08/09/2020   Lab Results  Component Value Date   CHOLHDL 4 08/09/2020   Lab Results  Component Value Date   HGBA1C 7.1 (H) 11/04/2020       Assessment & Plan:   Problem List Items Addressed This Visit      Unprioritized   COPD mixed type ([place])    pred taper  con't inhalers  Call back if symptoms worsen       Relevant Medications   predniSONE (DELTASONE) 10 MG tablet   COVID-19 - Primary    Rest  Antiviral called in Call back if symptoms worsen or do not improve  Quarantine 5 days in house -- may leave after that with mask       Relevant Medications   predniSONE (DELTASONE) 10 MG tablet   Nirmatrelvir & Ritonavir (PAXLOVID) 20 x 150 MG & 10 x 100MG  TBPK      I am having Verdun L. Maia Breslow" start on predniSONE and Paxlovid. I am also having him maintain his omeprazole, aspirin, cetirizine, multivitamin, Cyanocobalamin (B-12 PO), valACYclovir, benazepril-hydrochlorthiazide, Trelegy Ellipta, albuterol, and atorvastatin.  Meds ordered this encounter  Medications  . predniSONE (DELTASONE) 10 MG tablet    Sig: TAKE 3 TABLETS BY MOUTH ONCE DAILY FOR 3 DAYS THEN TAKE 2 TABLETS FOR 3 DAYS  THEN 1 TABLET FOR 3 DAYS THEN 1/2 TABLET FOR 3 DAYS    Dispense:  20 tablet    Refill:  0  . Nirmatrelvir & Ritonavir (PAXLOVID) 20 x 150 MG & 10 x 100MG  TBPK    Sig: Take 3 tablets by mouth in the morning and at bedtime.    Dispense:  30 tablet     Refill:  0     I discussed the assessment and treatment plan with the patient. The patient was provided an opportunity to ask questions and all were answered. The patient agreed with the plan and demonstrated an understanding of the instructions.   The patient was advised to call back or seek an in-person evaluation if the symptoms worsen or if the condition fails to improve as anticipated.  I provided 25 minutes of non-face-to-face time during this encounter.   Ann Held, DO [place] at [employer] [phone] (phone) [phone] (fax)  [place]

## 2020-12-13 NOTE — Assessment & Plan Note (Signed)
Rest  Antiviral called in Call back if symptoms worsen or do not improve  Quarantine 5 days in house -- may leave after that with mask

## 2020-12-14 ENCOUNTER — Other Ambulatory Visit: Payer: Self-pay | Admitting: Cardiology

## 2020-12-14 NOTE — Telephone Encounter (Signed)
Looks like this patient was seen by virtual visit yesterday.   I guess this was a "FYI" note?

## 2020-12-19 DIAGNOSIS — G4733 Obstructive sleep apnea (adult) (pediatric): Secondary | ICD-10-CM | POA: Diagnosis not present

## 2021-01-01 DIAGNOSIS — B0233 Zoster keratitis: Secondary | ICD-10-CM | POA: Diagnosis not present

## 2021-01-01 DIAGNOSIS — Z961 Presence of intraocular lens: Secondary | ICD-10-CM | POA: Diagnosis not present

## 2021-01-01 DIAGNOSIS — H18452 Nodular corneal degeneration, left eye: Secondary | ICD-10-CM | POA: Diagnosis not present

## 2021-01-01 DIAGNOSIS — Z9889 Other specified postprocedural states: Secondary | ICD-10-CM | POA: Diagnosis not present

## 2021-01-06 ENCOUNTER — Encounter: Payer: Self-pay | Admitting: Family Medicine

## 2021-01-06 DIAGNOSIS — I714 Abdominal aortic aneurysm, without rupture, unspecified: Secondary | ICD-10-CM

## 2021-01-07 ENCOUNTER — Other Ambulatory Visit: Payer: Self-pay

## 2021-01-07 ENCOUNTER — Ambulatory Visit (HOSPITAL_COMMUNITY): Payer: Medicare HMO | Attending: Cardiology

## 2021-01-07 DIAGNOSIS — I10 Essential (primary) hypertension: Secondary | ICD-10-CM | POA: Diagnosis not present

## 2021-01-07 DIAGNOSIS — I2510 Atherosclerotic heart disease of native coronary artery without angina pectoris: Secondary | ICD-10-CM | POA: Diagnosis not present

## 2021-01-07 DIAGNOSIS — R0602 Shortness of breath: Secondary | ICD-10-CM | POA: Insufficient documentation

## 2021-01-07 LAB — ECHOCARDIOGRAM COMPLETE
Area-P 1/2: 2.39 cm2
S' Lateral: 4.7 cm

## 2021-01-07 MED ORDER — PERFLUTREN LIPID MICROSPHERE
1.0000 mL | INTRAVENOUS | Status: AC | PRN
  Administered 2021-01-07: 2 mL via INTRAVENOUS

## 2021-01-09 ENCOUNTER — Telehealth: Payer: Self-pay | Admitting: *Deleted

## 2021-01-09 DIAGNOSIS — I714 Abdominal aortic aneurysm, without rupture, unspecified: Secondary | ICD-10-CM

## 2021-01-09 NOTE — Telephone Encounter (Signed)
Jerline Pain, MD to Kyle Bigness "Laurey Arrow" and proxy Kyle [name])    7:20 AM In this case, since you would want to have surgery here when it is needed,  I would recommend that I refer you to our vascular surgeons here at [facility] to monitor and discuss your care. My team can help facilitate this. We do not have access to images.   Candee Furbish, MD   Kyle Bigness "Laurey Arrow" to Jerline Pain, MD      1:28 PM Dr. Marlou Porch, Thanks for your response. I will go ahead with the phone call with the [place].  However, if surgery is required, I will need a referral from you to a vascular surgeon within  [facility].   [facility]  [place], and I do not want to do that.  I prefer to stay with my [facility] doctors.  Do you have access to the films from the [place]?   Thank you.  Jerline Pain, MD to Kyle Bigness "Laurey Arrow" and proxy Kyle [name])      11:34 AM Thanks for the update.   Agree with vascular surgery referral.   Candee Furbish, MD  Last read by Kyle Bigness at 4:08 PM on 01/07/2021.  Last read by Kyle [name] at 10:49 AM on 01/08/2021. January 06, 2021  Kyle Bigness "[place]" to Jerline Pain, MD       3:14 PM Dr. Marlou Porch, The last time I was in your office we discussed the [place] suggesting I get a routine abdominal ultrasound.    I had the ultrasound on 6.2.22, and they found an aneurysm.  I am sending the results to you and Dr. Elease Hashimoto, my primary physician.  I am also scheduled for an Echocardiogram tomorrow at your office.   [facility] between me and their [facility] surgeon on 7.5.22.  I do not plan on doing anything until I get a second opinion from you.   I will bring a hard copy of the report with me tomorrow.   Attached is a copy of their report.   Thank you.  Kyle [name]  Attachments  Ultrasound Results 6.2.22.pdf         order placed to refer pt to [facility] per Dr Candee Furbish.

## 2021-01-10 ENCOUNTER — Other Ambulatory Visit: Payer: Self-pay | Admitting: *Deleted

## 2021-01-10 DIAGNOSIS — I714 Abdominal aortic aneurysm, without rupture, unspecified: Secondary | ICD-10-CM

## 2021-01-16 NOTE — Progress Notes (Signed)
Subjective:    Patient ID: Kyle [name], male    DOB: 1945/11/26, 75 y.o.   MRN: [id number]  HPI   male former smoker followed for OSA, COPD GOLD III, Rhinitis Complicated by GERD, CAD/MI, HBP Unattended Home Sleep Test-01/05/2014, moderate OSA, AHI 19/hour, weight 223 pounds BIPAP titration 03/18/18- Office Spirometry 09/10/2016-severe obstructive airways disease: FVC 2.83/61%, FEV1 1.17/34%, ratio 0.41, FEF 25-75% 0.35/13% PFT 09/22/16-severe COPD, no response to dilator, diffusion moderately reduced, air trapping, hyperinflation. FVC 3.54/75%, FEV1 1.7 2/49%, ratio 0.49, TLC 123%, DLCO 56% O2 Qualifying Walk Test 01/17/21- 3 laps x 250 ft, lowest sat 94%, max HR 83. Nurse reports "good steady pace, able to talk as well". -----------------------------------------------------------------   05/24/20- 75 year old male former smoker followed for OSA, COPD, rhinitis complicated by GERD, CAD/MI, HBP, Covid infection May 2022,  BIPAP 15/11/ PS 3 ([place])/ [place] 3 Phizer Flu vax- had Body weight today- 234 -----using Bipap at night  Trelegy, albuterol hfa,  Use Trelegy daily. Rescue 1-2x/ week. Admits DOE a little worse over time- golf, exertion like lifting a chair. Little cough or wheeze and no acute episode. Comfortable with his BIPAP.  01/17/21- 75 year old male former smoker followed for OSA, COPD, rhinitis complicated by GERD, CAD/MI, HBP, Covid infection/ Paxlovid May 2022,  BIPAP 15/11/ PS 3 ([employer])/ [employer] Covid vax- 3 Phizer Body weight today-239 lbs Download-  Meds include prednisone taper started 6/17, Trelegy 100, Ventolin HFA,  Wife asked he be seen today to evaluate breathing, reporting his Cardiologist suggested Pulmonary Rehab. Gradually aware of more limiting DOE. Not abruptly worse after Covid last month. DOE limits golf to 9 holes max, short walks esp if carrying anything. Some cough productive white, no  wheeze. [facility] noted AAA on abd ultrasound. O2 Qualifying Walk Test 01/17/21- 3 laps x 250 ft, lowest sat 94%, max HR 83. Nurse reports "good steady pace, able to talk as well".  ROS-see HPI   + = positive Constitutional:    weight loss, night sweats, fevers, chills, fatigue, lassitude. HEENT:    headaches, difficulty swallowing, tooth/dental problems, sore throat,       sneezing, itching, ear ache, nasal congestion, post nasal drip, snoring CV:    chest pain, orthopnea, PND, swelling in lower extremities, anasarca,                                                           dizziness, palpitations Resp:   +shortness of breath with exertion or at rest.                +productive cough,   non-productive cough, coughing up of blood.              change in color of mucus.  +wheezing.   Skin:    rash or lesions. GI:  No-   heartburn, indigestion, abdominal pain, nausea, vomiting, diarrhea,                 change in bowel habits, loss of appetite GU: dysuria, change in color of urine, no urgency or frequency.   flank pain. MS:   joint pain, stiffness, decreased range of motion, back pain. Neuro-     nothing unusual Psych:  change in mood or affect.  depression or anxiety.  memory loss.     Objective:  OBJ- Physical Exam General- Alert, Oriented, Affect-appropriate, Distress- none acute, + overweight Skin- rash-none, lesions- none, excoriation- none Lymphadenopathy- none Head- atraumatic            Eyes- Gross vision intact, PERRLA, conjunctivae and secretions clear            Ears- Hearing, canals-normal            Nose- Clear, no-Septal dev, mucus, polyps, erosion, perforation,              Throat- Mallampati III , mucosa clear , drainage- none, tonsils- atrophic Neck- flexible , trachea midline, no stridor , thyroid nl, carotid no bruit Chest - symmetrical excursion , unlabored           Heart/CV- RRR , no murmur , no gallop  , no rub, nl s1 s2                           - JVD- none ,  edema- none, stasis changes- none, varices- none           Lung- + unlabored, +Clear/ diminished, wheeze- none, cough- none , dullness-none, rub- none           Chest wall-  Abd-  Br/ Gen/ Rectal- Not done, not indicated Extrem- cyanosis- none, clubbing, none, atrophy- none, strength- nl Neuro- grossly intact to observation  Assessment & Plan:

## 2021-01-17 ENCOUNTER — Ambulatory Visit: Payer: Medicare HMO | Admitting: Internal Medicine

## 2021-01-17 ENCOUNTER — Other Ambulatory Visit: Payer: Self-pay

## 2021-01-17 ENCOUNTER — Encounter: Payer: Self-pay | Admitting: Internal Medicine

## 2021-01-17 ENCOUNTER — Ambulatory Visit (INDEPENDENT_AMBULATORY_CARE_PROVIDER_SITE_OTHER): Payer: Medicare HMO

## 2021-01-17 VITALS — BP 130/70 | HR 66 | Ht 71.0 in | Wt 239.0 lb

## 2021-01-17 DIAGNOSIS — G4733 Obstructive sleep apnea (adult) (pediatric): Secondary | ICD-10-CM

## 2021-01-17 DIAGNOSIS — I714 Abdominal aortic aneurysm, without rupture, unspecified: Secondary | ICD-10-CM | POA: Insufficient documentation

## 2021-01-17 DIAGNOSIS — J449 Chronic obstructive pulmonary disease, unspecified: Secondary | ICD-10-CM

## 2021-01-17 LAB — CBC WITH DIFFERENTIAL/PLATELET
Basophils Absolute: 0.1 10*3/uL (ref 0.0–0.1)
Basophils Relative: 1.3 % (ref 0.0–3.0)
Eosinophils Absolute: 0.2 10*3/uL (ref 0.0–0.7)
Eosinophils Relative: 5 % (ref 0.0–5.0)
HCT: 32 % — ABNORMAL LOW (ref 39.0–52.0)
Hemoglobin: 10.1 g/dL — ABNORMAL LOW (ref 13.0–17.0)
Lymphocytes Relative: 23.6 % (ref 12.0–46.0)
Lymphs Abs: 1.1 10*3/uL (ref 0.7–4.0)
MCHC: 31.6 g/dL (ref 30.0–36.0)
MCV: 80.8 fl (ref 78.0–100.0)
Monocytes Absolute: 0.6 10*3/uL (ref 0.1–1.0)
Monocytes Relative: 13.1 % — ABNORMAL HIGH (ref 3.0–12.0)
Neutro Abs: 2.5 10*3/uL (ref 1.4–7.7)
Neutrophils Relative %: 57 % (ref 43.0–77.0)
Platelets: 254 10*3/uL (ref 150.0–400.0)
RBC: 3.96 Mil/uL — ABNORMAL LOW (ref 4.22–5.81)
RDW: 18.8 % — ABNORMAL HIGH (ref 11.5–15.5)
WBC: 4.5 10*3/uL (ref 4.0–10.5)

## 2021-01-17 MED ORDER — BREZTRI AEROSPHERE 160-9-4.8 MCG/ACT IN AERO: 2.0000 | INHALATION_SPRAY | Freq: Two times a day (BID) | RESPIRATORY_TRACT | 0 refills | Status: DC

## 2021-01-17 NOTE — Assessment & Plan Note (Signed)
Reported by wife- [facility] ultrasound

## 2021-01-17 NOTE — Assessment & Plan Note (Signed)
Continues to benefit from BIPAP

## 2021-01-17 NOTE — Patient Instructions (Addendum)
Order- Sample Breztri inhaler   inhale 2 puffs then rinse mouth, twice daily When this is used up, go back to Trelegy and see if you have a preference.  Order- CXR    DX COPD mixed type  Order- lab  CBC w diff    Dx COPD mixed type  Order- O2 qualifying walk test    dx COPD mixed type-done in office, did not qualify see report  Order- schedule overnight oximetry, "on BIPAP, on room air"     Order- refer for Pulmonary Rehab    dx COPD mixed type  Please call if we can help

## 2021-01-17 NOTE — Assessment & Plan Note (Signed)
This may be natural progression of his COPD. Not clear Covid changed much. Cardiac status followed by cardiology. Plan- CXR, lab to exclude anemia, O2 qual walk test, overnight oximetry on BIPAP/ room air, try sample Breztri instead of Trelegy, refer for Pulmonary Rehab.

## 2021-01-20 ENCOUNTER — Encounter (HOSPITAL_COMMUNITY): Payer: Self-pay | Admitting: *Deleted

## 2021-01-20 NOTE — Progress Notes (Signed)
Received referral from Dr. Annamaria Boots for this pt to participate in pulmonary rehab with the the diagnosis of COPD Stage III. Clinical review of pt follow up appt on 6/17  Pulmonary office note. Also reviewed follow up visits with cardiology - Dr. Marlou Porch and PCP Dr. Royetta Crochet Pt with Covid Risk Score - 7. Pt appropriate for scheduling for Pulmonary rehab.  Will forward to support staff for scheduling  when able as there is a long wait list and verification of insurance eligibility/benefits with pt consent. Cherre Huger, BSN Cardiac and Training and development officer

## 2021-01-21 ENCOUNTER — Ambulatory Visit (INDEPENDENT_AMBULATORY_CARE_PROVIDER_SITE_OTHER): Payer: Medicare HMO | Admitting: Family Medicine

## 2021-01-21 ENCOUNTER — Ambulatory Visit: Payer: Medicare HMO | Admitting: Family Medicine

## 2021-01-21 ENCOUNTER — Other Ambulatory Visit: Payer: Self-pay

## 2021-01-21 VITALS — BP 128/60 | HR 66 | Temp 97.3°F | Wt 235.1 lb

## 2021-01-21 DIAGNOSIS — R39198 Other difficulties with micturition: Secondary | ICD-10-CM | POA: Diagnosis not present

## 2021-01-21 DIAGNOSIS — D509 Iron deficiency anemia, unspecified: Secondary | ICD-10-CM | POA: Diagnosis not present

## 2021-01-21 NOTE — Progress Notes (Signed)
Established Patient Office Visit  Subjective:  Patient ID: Kyle [name], male    DOB: [birth year]  Age: 75 y.o. MRN: [id number]  CC:  Chief Complaint  Patient presents with   Follow-up    Follow up on anemia    HPI Kyle [name] presents for follow-up to discuss anemia noted on recent labs.  He had hemoglobin recent CBC of 10.1 with MCV of 80.8.  Looking back over records he had hemoglobin in 2019 which had dropped from around 13-10.5.  We had recommended further labs with ferritin, TIBC, serum iron but patient never came back for those.  He is followed by the [place].  He reportedly gets labs there yearly.  He also had increased RDW of 18.8 on recent labs.  Currently does not take any iron supplementation.  His last colonoscopy was 2015.  He had benign hyperplastic polyp.  He has good appetite.  Some weight gain but no weight loss issues recently.  Denies any melena.  No chronic nonsteroidal use.  He had incidental pickup of abdominal aortic aneurysm recently through the [place] on renal ultrasound.  He has pending follow-up with vascular surgery.  Occasionally has some slow stream and question of incomplete emptying of bladder.  Usually only gets up about once at night though.  No recent anticholinergic medication use.  Does drink a lot of fluids late in the day but generally no caffeine  Past Medical History:  Diagnosis Date   CAD (coronary artery disease) 2011   Cardiac arrhythmia    COPD (chronic obstructive pulmonary disease) (HCC)    GERD (gastroesophageal reflux disease)    Hyperlipidemia    Hypertension    Myocardial infarction ([place]) 2011    Past Surgical History:  Procedure Laterality Date   APPENDECTOMY     CARDIAC CATHETERIZATION N/A 05/05/2016   Procedure: Left Heart Cath and Coronary Angiography;  Surgeon: Jerline Pain, MD;  Location: [facility];  Service: Cardiovascular;  Laterality: N/A;   CORONARY ANGIOPLASTY WITH STENT PLACEMENT     EYE SURGERY     lasix  /BIL    Family History  Problem Relation Age of Onset   Stroke Mother    Stroke Father    Multiple sclerosis Sister     Social History   Socioeconomic History   Marital status: Married    Spouse name: Not on file   Number of children: Not on file   Years of education: Not on file   Highest education level: Not on file  Occupational History   Not on file  Tobacco Use   Smoking status: Former    Packs/day: 4.00    Years: 28.00    Pack years: 112.00    Types: Cigarettes    Quit date: 12/10/1993    Years since quitting: 27.1   Smokeless tobacco: Never  Vaping Use   Vaping Use: Never used  Substance and Sexual Activity   Alcohol use: Yes    Alcohol/week: 5.0 standard drinks    Types: 5 Standard drinks or equivalent per week   Drug use: No   Sexual activity: Not on file  Other Topics Concern   Not on file  Social History Narrative   Not on file   Social Determinants of Health   Financial Resource Strain: Low Risk    Difficulty of Paying Living Expenses: Not hard at all  Food Insecurity: No Food Insecurity   Worried About [place] in the Last Year: Never  true   Ran Out of Food in the Last Year: Never true  Transportation Needs: No Transportation Needs   Lack of Transportation (Medical): No   Lack of Transportation (Non-Medical): No  Physical Activity: Insufficiently Active   Days of Exercise per Week: 3 days   Minutes of Exercise per Session: 30 min  Stress: No Stress Concern Present   Feeling of Stress : Not at all  Social Connections: Socially Integrated   Frequency of Communication with Friends and Family: Twice a week   Frequency of Social Gatherings with Friends and Family: Twice a week   Attends Religious Services: 1 to 4 times per year   Active Member of [employer] or Organizations: Yes   Attends Archivist Meetings: 1 to 4 times per year   Marital Status: Married  Human resources officer Violence: Not At Risk   Fear of Current or Ex-Partner:  No   Emotionally Abused: No   Physically Abused: No   Sexually Abused: No    Outpatient Medications Prior to Visit  Medication Sig Dispense Refill   albuterol (VENTOLIN HFA) 108 (90 Base) MCG/ACT inhaler Inhale 2 puffs into the lungs every 6 (six) hours as needed for wheezing or shortness of breath. 8 g 12   aspirin 81 MG tablet Take 81 mg by mouth daily.     atorvastatin (LIPITOR) 40 MG tablet TAKE ONE TABLET BY MOUTH ONE TIME DAILY AT 6 IN THE EVENING 90 tablet 2   benazepril-hydrochlorthiazide (LOTENSIN HCT) 20-25 MG tablet TAKE ONE-HALF TABLET BY MOUTH ONE TIME DAILY 45 tablet 3   Budeson-Glycopyrrol-Formoterol (BREZTRI AEROSPHERE) 160-9-4.8 MCG/ACT AERO Inhale 2 puffs into the lungs in the morning and at bedtime. 10.7 g 0   cetirizine (ZYRTEC) 10 MG tablet Take 10 mg by mouth daily.     Cyanocobalamin (B-12 PO) Take 1 tablet by mouth daily.     Fluticasone-Umeclidin-Vilant (TRELEGY ELLIPTA) 100-62.5-25 MCG/INH AEPB Inhale 1 puff into the lungs daily. 60 each 12   Multiple Vitamin (MULTIVITAMIN) tablet Take 1 tablet by mouth daily.     omeprazole (PRILOSEC OTC) 20 MG tablet Take 20 mg by mouth daily as needed (heartburn).      valACYclovir (VALTREX) 1000 MG tablet 1 g.     Nirmatrelvir & Ritonavir (PAXLOVID) 20 x 150 MG & 10 x 100MG  TBPK Take 3 tablets by mouth in the morning and at bedtime. 30 tablet 0   predniSONE (DELTASONE) 10 MG tablet TAKE 3 TABLETS BY MOUTH ONCE DAILY FOR 3 DAYS THEN TAKE 2 TABLETS FOR 3 DAYS THEN 1 TABLET FOR 3 DAYS THEN 1/2 TABLET FOR 3 DAYS 20 tablet 0   No facility-administered medications prior to visit.    No Known Allergies  ROS Review of Systems  Constitutional:  Negative for appetite change, fatigue and unexpected weight change.  Eyes:  Negative for visual disturbance.  Respiratory:  Negative for cough, chest tightness and shortness of breath.   Cardiovascular:  Negative for chest pain, palpitations and leg swelling.  Gastrointestinal:  Negative  for abdominal pain and blood in stool.  Neurological:  Negative for dizziness, syncope, weakness, light-headedness and headaches.     Objective:    Physical Exam Vitals reviewed.  Constitutional:      Appearance: Normal appearance.  Cardiovascular:     Rate and Rhythm: Normal rate and regular rhythm.  Pulmonary:     Effort: Pulmonary effort is normal.     Breath sounds: Normal breath sounds.  Abdominal:     General:  Bowel sounds are normal.     Palpations: Abdomen is soft. There is no mass.     Tenderness: There is no abdominal tenderness. There is no guarding or rebound.  Neurological:     Mental Status: He is alert.    BP 128/60 (BP Location: Left Arm, Patient Position: Sitting, Cuff Size: Normal)   Pulse 66   Temp (!) 97.3 F (36.3 C) (Oral)   Wt 235 lb 1.6 oz (106.6 kg)   SpO2 96%   BMI 32.79 kg/m  Wt Readings from Last 3 Encounters:  01/21/21 235 lb 1.6 oz (106.6 kg)  01/17/21 239 lb (108.4 kg)  12/09/20 240 lb (108.9 kg)     There are no preventive care reminders to display for this patient.  There are no preventive care reminders to display for this patient.  Lab Results  Component Value Date   TSH 3.40 11/04/2020   Lab Results  Component Value Date   WBC 4.5 01/17/2021   HGB 10.1 (L) 01/17/2021   HCT 32.0 (L) 01/17/2021   MCV 80.8 01/17/2021   PLT 254.0 01/17/2021   Lab Results  Component Value Date   NA 139 08/09/2020   K 4.2 08/09/2020   CO2 30 08/09/2020   GLUCOSE 97 08/09/2020   BUN 18 08/09/2020   CREATININE 1.08 08/09/2020   BILITOT 0.4 08/09/2020   ALKPHOS 82 08/09/2020   AST 28 08/09/2020   ALT 31 08/09/2020   PROT 7.2 08/09/2020   ALBUMIN 4.5 08/09/2020   CALCIUM 9.7 08/09/2020   GFR 68.03 08/09/2020   Lab Results  Component Value Date   CHOL 103 08/09/2020   Lab Results  Component Value Date   HDL 23.30 (L) 08/09/2020   Lab Results  Component Value Date   LDLCALC 43 08/09/2020   Lab Results  Component Value Date    TRIG 184.0 (H) 08/09/2020   Lab Results  Component Value Date   CHOLHDL 4 08/09/2020   Lab Results  Component Value Date   HGBA1C 7.1 (H) 11/04/2020      Assessment & Plan:   #1 anemia with borderline microcytic indices.  Patient has some chronic dyspnea and this (anemia) certainly could be exacerbating.  Needs further evaluation.  He had recommended iron studies with ferritin and serum iron back in 2019 but he never returned.  Last colonoscopy 2015.  Suspect labs will confirm iron deficiency especially in view of high RDW  -Check ferritin, TIBC, serum iron -If labs confirm iron deficiency will recommend GI follow-up. -If no obvious source of blood loss found on GI evaluation consider possible iron infusion if patient not responding to oral replacement  #2 intermittent slow urinary stream.  Symptoms are relatively mild at this time.  No significant nocturia.  -Avoid anticholinergic medications -We discussed possible trial of Flomax but at this point he would like to observe.   No orders of the defined types were placed in this encounter.   Follow-up: No follow-ups on file.    Carolann Littler, MD

## 2021-01-22 ENCOUNTER — Ambulatory Visit: Payer: Medicare HMO | Admitting: Vascular Surgery

## 2021-01-22 ENCOUNTER — Encounter: Payer: Self-pay | Admitting: Vascular Surgery

## 2021-01-22 ENCOUNTER — Other Ambulatory Visit: Payer: Self-pay

## 2021-01-22 VITALS — BP 131/75 | HR 60 | Ht 71.0 in | Wt 234.5 lb

## 2021-01-22 DIAGNOSIS — I714 Abdominal aortic aneurysm, without rupture, unspecified: Secondary | ICD-10-CM

## 2021-01-22 LAB — FERRITIN: Ferritin: 11.5 ng/mL — ABNORMAL LOW (ref 22.0–322.0)

## 2021-01-22 LAB — IRON AND TIBC
Iron Saturation: 10 % — ABNORMAL LOW (ref 15–55)
Iron: 42 ug/dL (ref 38–169)
Total Iron Binding Capacity: 416 ug/dL (ref 250–450)
UIBC: 374 ug/dL — ABNORMAL HIGH (ref 111–343)

## 2021-01-22 LAB — VITAMIN B12: Vitamin B-12: >1550 pg/mL — ABNORMAL HIGH (ref 211–911)

## 2021-01-22 NOTE — Progress Notes (Signed)
Referring Physician: Dr. Marlou Porch  Patient name: Kyle [name] MRN: [id number] DOB: [birth year] Sex: male  REASON FOR CONSULT: Abdominal aortic aneurysm  HPI: Kyle [name] is a 75 y.o. male, found to have a 4.9 cm abdominal aortic aneurysm on screening ultrasound at the [facility] hospital in [place].  He has a family Struve aneurysms in his sister.  He does have a history of tobacco abuse and smoked from age 58 until age 24.  He does not have any abdominal or back pain.  Other medical problems include COPD hyperlipidemia hypertension.  He is followed by Dr. Annamaria Boots from pulmonary for his COPD.  He currently is able to go on incline of 15 steps before getting short of breath.  He was also recently noted to have anemia and currently is undergoing work-up for this.  He is on aspirin and a statin.  Past Medical History:  Diagnosis Date   CAD (coronary artery disease) 2011   Cardiac arrhythmia    COPD (chronic obstructive pulmonary disease) (HCC)    GERD (gastroesophageal reflux disease)    Hyperlipidemia    Hypertension    Myocardial infarction [facility]) 2011   Past Surgical History:  Procedure Laterality Date   APPENDECTOMY     CARDIAC CATHETERIZATION N/A 05/05/2016   Procedure: Left Heart Cath and Coronary Angiography;  Surgeon: Jerline Pain, MD;  Location: [place] [facility];  Service: Cardiovascular;  Laterality: N/A;   CORONARY ANGIOPLASTY WITH STENT PLACEMENT     EYE SURGERY     lasix /BIL    Family History  Problem Relation Age of Onset   Stroke Mother    Stroke Father    Multiple sclerosis Sister     SOCIAL HISTORY: Social History   Socioeconomic History   Marital status: Married    Spouse name: Not on file   Number of children: Not on file   Years of education: Not on file   Highest education level: Not on file  Occupational History   Not on file  Tobacco Use   Smoking status: Former    Packs/day: 4.00    Years: 28.00    Pack years: 112.00    Types: Cigarettes     Quit date: 12/10/1993    Years since quitting: 27.1   Smokeless tobacco: Never  Vaping Use   Vaping Use: Never used  Substance and Sexual Activity   Alcohol use: Yes    Alcohol/week: 5.0 standard drinks    Types: 5 Standard drinks or equivalent per week   Drug use: No   Sexual activity: Not on file  Other Topics Concern   Not on file  Social History Narrative   Not on file   Social Determinants of Health   Financial Resource Strain: Low Risk    Difficulty of Paying Living Expenses: Not hard at all  Food Insecurity: No Food Insecurity   Worried About Charity fundraiser in the Last Year: Never true   [place] in the Last Year: Never true  Transportation Needs: No Transportation Needs   Lack of Transportation (Medical): No   Lack of Transportation (Non-Medical): No  Physical Activity: Insufficiently Active   Days of Exercise per Week: 3 days   Minutes of Exercise per Session: 30 min  Stress: No Stress Concern Present   Feeling of Stress : Not at all  Social Connections: Socially Integrated   Frequency of Communication with Friends and Family: Twice a week   Frequency of  Social Gatherings with Friends and Family: Twice a week   Attends Religious Services: 1 to 4 times per year   Active Member of [employer] or Organizations: Yes   Attends Music therapist: 1 to 4 times per year   Marital Status: Married  Human resources officer Violence: Not At Risk   Fear of Current or Ex-Partner: No   Emotionally Abused: No   Physically Abused: No   Sexually Abused: No    No Known Allergies  Current Outpatient Medications  Medication Sig Dispense Refill   albuterol (VENTOLIN HFA) 108 (90 Base) MCG/ACT inhaler Inhale 2 puffs into the lungs every 6 (six) hours as needed for wheezing or shortness of breath. 8 g 12   aspirin 81 MG tablet Take 81 mg by mouth daily.     atorvastatin (LIPITOR) 40 MG tablet TAKE ONE TABLET BY MOUTH ONE TIME DAILY AT 6 IN THE EVENING 90 tablet 2    benazepril-hydrochlorthiazide (LOTENSIN HCT) 20-25 MG tablet TAKE ONE-HALF TABLET BY MOUTH ONE TIME DAILY 45 tablet 3   Budeson-Glycopyrrol-Formoterol (BREZTRI AEROSPHERE) 160-9-4.8 MCG/ACT AERO Inhale 2 puffs into the lungs in the morning and at bedtime. 10.7 g 0   cetirizine (ZYRTEC) 10 MG tablet Take 10 mg by mouth daily.     Cyanocobalamin (B-12 PO) Take 1 tablet by mouth daily.     Fluticasone-Umeclidin-Vilant (TRELEGY ELLIPTA) 100-62.5-25 MCG/INH AEPB Inhale 1 puff into the lungs daily. 60 each 12   Multiple Vitamin (MULTIVITAMIN) tablet Take 1 tablet by mouth daily.     omeprazole (PRILOSEC OTC) 20 MG tablet Take 20 mg by mouth daily as needed (heartburn).      valACYclovir (VALTREX) 1000 MG tablet 1 g.     No current facility-administered medications for this visit.    ROS:   General:  No weight loss, Fever, chills  HEENT: No recent headaches, no nasal bleeding, no visual changes, no sore throat  Neurologic: No dizziness, blackouts, seizures. No recent symptoms of stroke or mini- stroke. No recent episodes of slurred speech, or temporary blindness.  Cardiac: No recent episodes of chest pain/pressure, no shortness of breath at rest.  + shortness of breath with exertion.  Denies history of atrial fibrillation or irregular heartbeat  Vascular: No history of rest pain in feet.  No history of claudication.  No history of non-healing ulcer, No history of DVT   Pulmonary: No home oxygen, no productive cough, no hemoptysis,  No asthma or wheezing  Musculoskeletal:  [ ]  Arthritis, [ ]  Low back pain,  [ ]  Joint pain  Hematologic:No history of hypercoagulable state.  No history of easy bleeding.  +history of anemia  Gastrointestinal: No hematochezia or melena,  No gastroesophageal reflux, no trouble swallowing  Urinary: [ ]  chronic Kidney disease, [ ]  on HD - [ ]  MWF or [ ]  TTHS, [ ]  Burning with urination, [ ]  Frequent urination, [ ]  Difficulty urinating;   Skin: No  rashes  Psychological: No history of anxiety,  No history of depression   Physical Examination  Vitals:   01/22/21 1025  BP: 131/75  Pulse: 60  SpO2: 95%  Weight: 234 lb 8 oz (106.4 kg)  Height: 5\' 11"  (1.803 m)    Body mass index is 32.71 kg/m.  General:  Alert and oriented, no acute distress HEENT: Normal Neck: No JVD Cardiac: Regular Rate and Rhythm Abdomen: Soft, non-tender, non-distended, no mass Skin: No rash Extremity Pulses:  2+ radial, brachial, femoral, dorsalis pedis, posterior tibial pulses bilaterally  Musculoskeletal: No deformity or edema  Neurologic: Upper and lower extremity motor 5/5 and symmetric  DATA:  I reviewed report from the [facility] hospital which shows a 4.9 cm abdominal aortic aneurysm.  Images were not available for review.  ASSESSMENT: 4.9 cm infrarenal abdominal aortic aneurysm currently asymptomatic   PLAN: We will obtain a CT angiogram of the chest abdomen and pelvis to further define the anatomy and precise measurement of his aneurysm diameter.  We will schedule him for a virtual visit to go over the results of this in the next couple of weeks.  Patient was informed that if he ever goes to the emergency room with severe back or abdominal pain to let them know that he has a history of abdominal aortic aneurysm.  Pathophysiology of abdominal aortic aneurysms risk of rupture all discussed with the patient and his wife today.   Ruta Hinds, MD [facility] Office: [phone]

## 2021-01-23 ENCOUNTER — Telehealth: Payer: Self-pay | Admitting: Family Medicine

## 2021-01-23 ENCOUNTER — Encounter: Payer: Self-pay | Admitting: *Deleted

## 2021-01-23 ENCOUNTER — Other Ambulatory Visit: Payer: Self-pay

## 2021-01-23 DIAGNOSIS — E611 Iron deficiency: Secondary | ICD-10-CM

## 2021-01-23 NOTE — Telephone Encounter (Signed)
Patient's spouse called stating that they received a call from [place] regarding results but they were confused and needed more clarification.   They would like a call back when possible to explain more about lab results.

## 2021-01-23 NOTE — Progress Notes (Signed)
I spoke with the pt and notified of results per CDY. He verbalized understanding.

## 2021-01-24 NOTE — Telephone Encounter (Signed)
Spoke with the patients spouse. She wanted to me to go over the results again with her. We discussed the results, she expressed understanding. Nothing further needed.

## 2021-01-29 ENCOUNTER — Ambulatory Visit (HOSPITAL_COMMUNITY)
Admission: RE | Admit: 2021-01-29 | Discharge: 2021-01-29 | Disposition: A | Discharge status: Home / Self Care | Payer: Medicare HMO | Source: Ambulatory Visit | Attending: Vascular Surgery | Admitting: Vascular Surgery

## 2021-01-29 ENCOUNTER — Other Ambulatory Visit: Payer: Self-pay

## 2021-01-29 DIAGNOSIS — I714 Abdominal aortic aneurysm, without rupture, unspecified: Secondary | ICD-10-CM

## 2021-01-29 LAB — POCT I-STAT CREATININE: Creatinine, Ser: 1.1 mg/dL (ref 0.61–1.24)

## 2021-01-29 MED ORDER — IOHEXOL 350 MG/ML SOLN
100.0000 mL | Freq: Once | INTRAVENOUS | Status: AC | PRN
  Administered 2021-01-29: 100 mL via INTRAVENOUS

## 2021-02-06 ENCOUNTER — Encounter: Payer: Self-pay | Admitting: Family Medicine

## 2021-02-06 ENCOUNTER — Ambulatory Visit (INDEPENDENT_AMBULATORY_CARE_PROVIDER_SITE_OTHER): Payer: Medicare HMO | Admitting: Vascular Surgery

## 2021-02-06 ENCOUNTER — Other Ambulatory Visit: Payer: Self-pay

## 2021-02-06 DIAGNOSIS — I714 Abdominal aortic aneurysm, without rupture, unspecified: Secondary | ICD-10-CM

## 2021-02-06 NOTE — Telephone Encounter (Signed)
Called [employer] and spoke with Mia. She stated that the ONO machine was dropped off with the patient on 02/04/21. The ONO was sent to the incorrect fax. I provided her with the correct fax. Will give to Dr. Annamaria Boots as soon as it been received.

## 2021-02-06 NOTE — Progress Notes (Signed)
Virtual Visit via Telephone Note  Referring MD: Dr. Marlou Porch  I connected with Kyle [name] on 02/06/2021 using the [employer] by telephone and verified that I was speaking with the correct person using two identifiers. Patient was located at home and accompanied by his wife. I am located at our office on [facility].   The limitations of evaluation and management by telemedicine and the availability of in person appointments have been previously discussed with the patient and are documented in the patients chart. The patient expressed understanding and consented to proceed.  PCP: Eulas Post, MD  History of Present Illness: STACIE KNUTZEN is a 75 y.o. male with with known history of abdominal aortic aneurysm.  This was found on a screening ultrasound at the [facility] hospital in [place].  The patient does have a family history of aneurysm in his sister.  He is a former smoker but quit 25 years ago.  He has underlying COPD and is followed by Dr. Annamaria Boots for this.  He is only able to go about 15 steps on an incline before getting short of breath.  He is on aspirin and statin.  Past Medical History:  Diagnosis Date   CAD (coronary artery disease) 2011   Cardiac arrhythmia    COPD (chronic obstructive pulmonary disease) (HCC)    GERD (gastroesophageal reflux disease)    Hyperlipidemia    Hypertension    Myocardial infarction [facility]) 2011    Past Surgical History:  Procedure Laterality Date   APPENDECTOMY     CARDIAC CATHETERIZATION N/A 05/05/2016   Procedure: Left Heart Cath and Coronary Angiography;  Surgeon: Jerline Pain, MD;  Location: [place] [facility];  Service: Cardiovascular;  Laterality: N/A;   CORONARY ANGIOPLASTY WITH STENT PLACEMENT     EYE SURGERY     lasix /BIL    Current Meds  Medication Sig   albuterol (VENTOLIN HFA) 108 (90 Base) MCG/ACT inhaler Inhale 2 puffs into the lungs every 6 (six) hours as needed for wheezing or shortness of breath.   aspirin 81 MG tablet  Take 81 mg by mouth daily.   atorvastatin (LIPITOR) 40 MG tablet TAKE ONE TABLET BY MOUTH ONE TIME DAILY AT 6 IN THE EVENING   benazepril-hydrochlorthiazide (LOTENSIN HCT) 20-25 MG tablet TAKE ONE-HALF TABLET BY MOUTH ONE TIME DAILY   cetirizine (ZYRTEC) 10 MG tablet Take 10 mg by mouth daily.   Cyanocobalamin (B-12 PO) Take 1 tablet by mouth daily.   Fluticasone-Umeclidin-Vilant (TRELEGY ELLIPTA) 100-62.5-25 MCG/INH AEPB Inhale 1 puff into the lungs daily.   Multiple Vitamin (MULTIVITAMIN) tablet Take 1 tablet by mouth daily.   omeprazole (PRILOSEC OTC) 20 MG tablet Take 20 mg by mouth daily as needed (heartburn).    valACYclovir (VALTREX) 1000 MG tablet 1 g.     Observations/Objective:  I reviewed the patient's CT angio of the abdomen and pelvis and chest dated January 29, 2021.  This shows a 4.7 cm infrarenal abdominal aortic aneurysm.  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  There is no significant celiac or SMA stenosis.  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  Assessment and Plan: 4.7 cm infrarenal abdominal aortic aneurysm.  Follow Up Instructions:  Patient will follow up in 6 months with an aortic ultrasound in our office.  Consideration for repair at 5 and half centimeters.  Also consider repair at 3 cm diameter for his iliac aneurysm.   I discussed the assessment and treatment plan  with the patient. The patient was provided an opportunity to ask questions and all were answered. The patient agreed with the plan and demonstrated an understanding of the instructions.   The patient was advised to call back or seek an in-person evaluation if the symptoms worsen or if the condition fails to improve as anticipated.  I spent 10 minutes with the patient via telephone encounter and reviewing appropriate imaging.Carmelina Peal [facility] [place] Office: [phone]  02/06/2021, 10:15 AM

## 2021-02-07 ENCOUNTER — Encounter: Payer: Self-pay | Admitting: Gastroenterology

## 2021-02-07 ENCOUNTER — Other Ambulatory Visit: Payer: Self-pay

## 2021-02-07 DIAGNOSIS — I714 Abdominal aortic aneurysm, without rupture, unspecified: Secondary | ICD-10-CM

## 2021-02-18 ENCOUNTER — Telehealth (HOSPITAL_COMMUNITY): Payer: Self-pay

## 2021-02-18 NOTE — Telephone Encounter (Signed)
Pt insurance is active and benefits verified through [facility]. Co-pay $45.00, DED $0.00/$0.00 met, out of pocket $4,500.00/$411.75 met, co-insurance 0%. No pre-authorization required. Lyn/[employer] Medicare, 02/18/21 @ 2:46PM, GQB#[id number]   Will contact patient to see if he is interested in the Pulmonary Rehab Program.

## 2021-02-18 NOTE — Telephone Encounter (Signed)
Have ONO results been received from [place]?

## 2021-02-18 NOTE — Telephone Encounter (Signed)
Called and spoke with pt wife Butch Penny who stated pt interested in our PR program. Explained scheduling process and went over insurance, patient verbalized understanding. Also adv pt where we are with scheduling for PR and that we have a backlog of 1-3 months.

## 2021-02-19 NOTE — Telephone Encounter (Signed)
Dr. Annamaria Boots have you received ONO results for this patient?

## 2021-02-19 NOTE — Telephone Encounter (Signed)
While wearing BIPAP, his oxygen level stayed in normal range. He does not need to add oxygen while seeping with his BIPAP.

## 2021-03-13 ENCOUNTER — Ambulatory Visit: Payer: Medicare HMO | Admitting: Gastroenterology

## 2021-03-13 ENCOUNTER — Encounter: Payer: Self-pay | Admitting: Gastroenterology

## 2021-03-13 VITALS — BP 130/68 | HR 80 | Ht 70.25 in | Wt 239.4 lb

## 2021-03-13 DIAGNOSIS — R1319 Other dysphagia: Secondary | ICD-10-CM | POA: Diagnosis not present

## 2021-03-13 DIAGNOSIS — D509 Iron deficiency anemia, unspecified: Secondary | ICD-10-CM | POA: Diagnosis not present

## 2021-03-13 MED ORDER — PLENVU 140 G PO SOLR: ORAL | 0 refills | Status: DC

## 2021-03-13 NOTE — Patient Instructions (Addendum)
If you are age 74 or older, your body mass index should be between 23-30. Your Body mass index is 34.1 kg/m. If this is out of the aforementioned range listed, please consider follow up with your Primary Care Provider.  If you are age 2 or younger, your body mass index should be between 19-25. Your Body mass index is 34.1 kg/m. If this is out of the aformentioned range listed, please consider follow up with your Primary Care Provider.   You have been scheduled for an endoscopy and colonoscopy. Please follow the written instructions given to you at your visit today. Please pick up your prep supplies at the pharmacy within the next 1-3 days. If you use inhalers (even only as needed), please bring them with you on the day of your procedure.  The [facility] providers would like to encourage you to use [facility] to communicate with providers for non-urgent requests or questions.  Due to long hold times on the telephone, sending your provider a message by [facility] may be a faster and more efficient way to get a response.  Please allow 48 business hours for a response.  Please remember that this is for non-urgent requests.   It was a pleasure to see you today!  Thank you for trusting me with your gastrointestinal care!    Scott E. Candis Schatz, MD

## 2021-03-13 NOTE — Progress Notes (Signed)
HPI : Kyle [name] is a very pleasant 75 year old male with a history of CAD, COPD and OSA referred by Dr. Carolann Littler for further evaluation of iron deficiency anemia.  He denies any history of overt GI blood loss.  He also denies any chronic GI symptoms to include abdominal pain, constipation, or diarrhea.  He has a history of GERD symptoms and excessive belching which are controlled with once daily omeprazole which he has been taking for many years.  He does report a history of chronic intermittent solid dysphagia which was noted years ago, and is improved from years past, although this may be related to modifications in how he eats (chewing more carefully, taking smaller bites).   His last colonoscopy was in 2015 and a small  hyperplastic polyp was removed.  He thinks that his colonoscopy prior to that was normal.  He has a chronic anemia with a hgb >11 in 2019 with an MCV in the low 80s.  His Hgb has been stable since then.  An iron panel in June of this year was consistent with iron deficiency (ferritin 11, iron sat 10%).  He has no significant renal impairment.  He denies symptoms of fatigue, weakness, light-headedness, shortness of breath, restless leg, pica.   Past Medical History:  Diagnosis Date   Anemia    CAD (coronary artery disease) 08/03/2009   Cardiac arrhythmia    COPD (chronic obstructive pulmonary disease) (HCC)    GERD (gastroesophageal reflux disease)    Hyperlipidemia    Hypertension    Hypothyroidism    Myocardial infarction ([place]) 08/03/2009   Sleep apnea treated with nocturnal BiPAP      Past Surgical History:  Procedure Laterality Date   APPENDECTOMY     CARDIAC CATHETERIZATION N/A 05/05/2016   Procedure: Left Heart Cath and Coronary Angiography;  Surgeon: Jerline Pain, MD;  Location: [facility];  Service: Cardiovascular;  Laterality: N/A;   CORONARY ANGIOPLASTY WITH STENT PLACEMENT     EYE SURGERY     lasix /BIL   TONSILLECTOMY     Family  History  Problem Relation Age of Onset   Stroke Mother    Hypertension Mother    Stroke Father    Hypotension Father    Multiple sclerosis Sister    Thyroid disease Brother    Social History   Tobacco Use   Smoking status: Former    Packs/day: 4.00    Years: 28.00    Pack years: 112.00    Types: Cigarettes    Quit date: 12/10/1993    Years since quitting: 27.2   Smokeless tobacco: Never  Vaping Use   Vaping Use: Never used  Substance Use Topics   Alcohol use: Yes    Alcohol/week: 5.0 standard drinks    Types: 5 Standard drinks or equivalent per week    Comment: once every 2 months   Drug use: No   Current Outpatient Medications  Medication Sig Dispense Refill   albuterol (VENTOLIN HFA) 108 (90 Base) MCG/ACT inhaler Inhale 2 puffs into the lungs every 6 (six) hours as needed for wheezing or shortness of breath. 8 g 12   aspirin 81 MG tablet Take 81 mg by mouth daily.     atorvastatin (LIPITOR) 40 MG tablet TAKE ONE TABLET BY MOUTH ONE TIME DAILY AT 6 IN THE EVENING 90 tablet 2   benazepril-hydrochlorthiazide (LOTENSIN HCT) 20-25 MG tablet TAKE ONE-HALF TABLET BY MOUTH ONE TIME DAILY 45 tablet 3  cetirizine (ZYRTEC) 10 MG tablet Take 10 mg by mouth daily.     Cyanocobalamin (B-12 PO) Take 1 tablet by mouth daily.     Fluticasone-Umeclidin-Vilant (TRELEGY ELLIPTA) 100-62.5-25 MCG/INH AEPB Inhale 1 puff into the lungs daily. 60 each 12   Multiple Vitamin (MULTIVITAMIN) tablet Take 1 tablet by mouth daily.     omeprazole (PRILOSEC OTC) 20 MG tablet Take 20 mg by mouth daily as needed (heartburn).      PEG-KCl-NaCl-NaSulf-Na Asc-C (PLENVU) 140 g SOLR Use as directed. Manufacturer's coupon Universal coupon code:BIN: P2366821; GROUP: [id number]; PCN: [id number]; ID: [id number]; PAY NO MORE $50; NO prior authorization 1 each 0   valACYclovir (VALTREX) 1000 MG tablet 1 g.     No current facility-administered medications for this visit.   No Known Allergies   Review of Systems: All  systems reviewed and negative except where noted in HPI.    No results found.  Physical Exam: BP 130/68 (BP Location: Left Arm, Patient Position: Sitting, Cuff Size: Normal)   Pulse 80 Comment: irregular  Ht 5' 10.25" (1.784 m) Comment: height measured without shoes  Wt 239 lb 6 oz (108.6 kg)   BMI 34.10 kg/m  Constitutional: Pleasant,well-developed, African American male in no acute distress. HEENT: Normocephalic and atraumatic. Conjunctivae are normal. No scleral icterus. Neck supple.  Cardiovascular: Normal rate, regular rhythm.  Pulmonary/chest: Effort normal and breath sounds normal. No wheezing, rales or rhonchi. Abdominal: Soft, nondistended, nontender. Bowel sounds active throughout. There are no masses palpable. No hepatomegaly. Extremities: no edema Lymphadenopathy: No cervical adenopathy noted. Neurological: Alert and oriented to person place and time. Skin: Skin is warm and dry. No rashes noted. Psychiatric: Normal mood and affect. Behavior is normal.  CBC    Component Value Date/Time   WBC 4.5 01/17/2021 1132   RBC 3.96 (L) 01/17/2021 1132   HGB 10.1 (L) 01/17/2021 1132   HCT 32.0 (L) 01/17/2021 1132   PLT 254.0 01/17/2021 1132   MCV 80.8 01/17/2021 1132   MCH 31.2 04/27/2016 1020   MCHC 31.6 01/17/2021 1132   RDW 18.8 (H) 01/17/2021 1132   LYMPHSABS 1.1 01/17/2021 1132   MONOABS 0.6 01/17/2021 1132   EOSABS 0.2 01/17/2021 1132   BASOSABS 0.1 01/17/2021 1132    CMP     Component Value Date/Time   NA 139 08/09/2020 1447   K 4.2 08/09/2020 1447   CL 103 08/09/2020 1447   CO2 30 08/09/2020 1447   GLUCOSE 97 08/09/2020 1447   BUN 18 08/09/2020 1447   CREATININE 1.10 01/29/2021 1444   CREATININE 1.00 04/27/2016 1020   CALCIUM 9.7 08/09/2020 1447   PROT 7.2 08/09/2020 1447   ALBUMIN 4.5 08/09/2020 1447   AST 28 08/09/2020 1447   ALT 31 08/09/2020 1447   ALKPHOS 82 08/09/2020 1447   BILITOT 0.4 08/09/2020 1447   GFRNONAA >60 01/16/2010 0420   GFRAA   01/16/2010 0420    >60        The eGFR has been calculated using the MDRD equation. This calculation has not been validated in all clinical situations. eGFR's persistently <60 mL/min signify possible Chronic Kidney Disease.    Results for KELVIS, BERGER "PETE" (MRN [id number]) as of 03/15/2021 14:51  Ref. Range 01/21/2021 16:05  Iron Latest Ref Range: 38 - 169 ug/dL 42  UIBC Latest Ref Range: 111 - 343 ug/dL 374 (H)  TIBC Latest Ref Range: 250 - 450 ug/dL 416  Ferritin Latest Ref Range: 22.0 - 322.0 ng/mL 11.5 (L)  Iron  Saturation Latest Ref Range: 15 - 55 % 10 (L)   Results for SHERYL, TOWELL "PETE" (MRN [id number]) as of 03/15/2021 15:33  Ref. Range 09/11/2014 11:34 03/02/2016 09:31 04/27/2016 10:20 04/19/2018 10:26 01/17/2021 11:32  WBC Latest Ref Range: 4.0 - 10.5 K/uL 5.0 4.3 5.2 4.9 4.5  RBC Latest Ref Range: 4.22 - 5.81 Mil/uL 4.43 4.17 (L) 4.10 (L) 4.02 (L) 3.96 (L)  Hemoglobin Latest Ref Range: 13.0 - 17.0 g/dL 13.7 12.9 (L) 12.8 (L) 10.5 (L) 10.1 (L)  HCT Latest Ref Range: 39.0 - 52.0 % 40.1 38.7 (L) 38.1 (L) 33.0 (L) 32.0 (L)  MCV Latest Ref Range: 78.0 - 100.0 fl 90.5 92.9 92.9 82.0 80.8  MCH Latest Ref Range: 27.0 - 33.0 pg   31.2    MCHC Latest Ref Range: 30.0 - 36.0 g/dL 34.2 33.4 33.6 32.0 31.6  RDW Latest Ref Range: 11.5 - 15.5 % 14.4 13.0 13.3 15.4 18.8 (H)  Platelets Latest Ref Range: 150.0 - 400.0 K/uL 197.0 187.0 202 223.0 254.0    ASSESSMENT AND PLAN: 75 year old male with history of CAD, COPD and OSA with chronic iron deficiency anemia stable since at least 2019.  No overt GI blood loss.  No weight loss.  He has chronic dysphagia and has been taking a PPI for many years.  I suspect his IDA may be related to iron malabsorption secondary to PPI use, but an EGD and colonoscopy is warranted to exclude malignancy/polyp, vascular malformations.  Will take gastric biopsies to exclude H. Pylori  Iron deficiency anemia, asymptomatic  - EGD with gastric biopsies  -  Colonoscopy  - If no identifiable etiology found, consider trial off PPI with iron supplementation  Dysphagia  - EGD with possible dilation  The details, risks (including bleeding, perforation, infection, missed lesions, medication reactions and possible hospitalization or surgery if complications occur), benefits, and alternatives to EGD/colonoscopy with possible biopsy and possible polypectomy were discussed with the patient and he consents to proceed.   Jola Critzer E. Candis Schatz, MD [facility]     CC: Eulas Post, MD

## 2021-03-15 ENCOUNTER — Encounter: Payer: Self-pay | Admitting: Gastroenterology

## 2021-03-17 ENCOUNTER — Telehealth (HOSPITAL_COMMUNITY): Payer: Self-pay

## 2021-03-17 NOTE — Telephone Encounter (Signed)
Called and spoke with pt in regards to PR, pt stated he would have to call back later. Adv pt we are scheduling for Sep as of right now and only have a limited number of slots.

## 2021-03-17 NOTE — Telephone Encounter (Signed)
Pt wife Butch Penny call back and stated pt in interested in PR. Pt will come in for PR orientation on 04/25/21 @ 9AM and attend the 10:15AM class.   Mailed letter

## 2021-04-21 ENCOUNTER — Other Ambulatory Visit: Payer: Self-pay

## 2021-04-21 ENCOUNTER — Ambulatory Visit (AMBULATORY_SURGERY_CENTER): Payer: Medicare HMO | Admitting: Gastroenterology

## 2021-04-21 ENCOUNTER — Encounter: Payer: Self-pay | Admitting: Gastroenterology

## 2021-04-21 VITALS — BP 130/68 | HR 60 | Temp 96.8°F | Resp 10 | Ht 70.0 in | Wt 239.0 lb

## 2021-04-21 DIAGNOSIS — J449 Chronic obstructive pulmonary disease, unspecified: Secondary | ICD-10-CM | POA: Diagnosis not present

## 2021-04-21 DIAGNOSIS — R1319 Other dysphagia: Secondary | ICD-10-CM

## 2021-04-21 DIAGNOSIS — D509 Iron deficiency anemia, unspecified: Secondary | ICD-10-CM

## 2021-04-21 DIAGNOSIS — D175 Benign lipomatous neoplasm of intra-abdominal organs: Secondary | ICD-10-CM | POA: Diagnosis not present

## 2021-04-21 DIAGNOSIS — K317 Polyp of stomach and duodenum: Secondary | ICD-10-CM

## 2021-04-21 DIAGNOSIS — K5520 Angiodysplasia of colon without hemorrhage: Secondary | ICD-10-CM

## 2021-04-21 DIAGNOSIS — I2510 Atherosclerotic heart disease of native coronary artery without angina pectoris: Secondary | ICD-10-CM | POA: Diagnosis not present

## 2021-04-21 DIAGNOSIS — E669 Obesity, unspecified: Secondary | ICD-10-CM | POA: Diagnosis not present

## 2021-04-21 DIAGNOSIS — I10 Essential (primary) hypertension: Secondary | ICD-10-CM | POA: Diagnosis not present

## 2021-04-21 DIAGNOSIS — R1310 Dysphagia, unspecified: Secondary | ICD-10-CM | POA: Diagnosis not present

## 2021-04-21 DIAGNOSIS — K319 Disease of stomach and duodenum, unspecified: Secondary | ICD-10-CM | POA: Diagnosis not present

## 2021-04-21 MED ORDER — SODIUM CHLORIDE 0.9 % IV SOLN: 500.0000 mL | Freq: Once | INTRAVENOUS | Status: DC

## 2021-04-21 MED ORDER — FERROUS SULFATE 325 (65 FE) MG PO TABS: 325.0000 mg | ORAL_TABLET | Freq: Every day | ORAL | 3 refills | Status: DC

## 2021-04-21 NOTE — Progress Notes (Signed)
[facility] History and Physical   Primary Care Physician:  Eulas Post, MD   Reason for Procedure:   Iron deficiency anemia  Plan:    Diagnostic upper and lower endoscopy     HPI: ALEXIUS BOONSTRA is a 75 y.o. male with mild chronic iron deficiency anemia without any chronic GI symptoms.  Specifically, he denies abdominal pain, diarrhea, constipation or blood in the stool.  No melena.  He has a history of GERD controlled with omeprazole which he has taken for many years. Colonoscopy in 2015 (details not available for review).  No change in his symptoms or medical history since his clinic visit last monthn   Past Medical History:  Diagnosis Date   Anemia    CAD (coronary artery disease) 08/03/2009   Cardiac arrhythmia    COPD (chronic obstructive pulmonary disease) (HCC)    GERD (gastroesophageal reflux disease)    Hyperlipidemia    Hypertension    Hypothyroidism    Myocardial infarction ([place]) 08/03/2009   Sleep apnea treated with nocturnal BiPAP     Past Surgical History:  Procedure Laterality Date   APPENDECTOMY     CARDIAC CATHETERIZATION N/A 05/05/2016   Procedure: Left Heart Cath and Coronary Angiography;  Surgeon: Jerline Pain, MD;  Location: [facility];  Service: Cardiovascular;  Laterality: N/A;   CORONARY ANGIOPLASTY WITH STENT PLACEMENT     EYE SURGERY     lasix /BIL   TONSILLECTOMY      Prior to Admission medications   Medication Sig Start Date End Date Taking? Authorizing Provider  albuterol (VENTOLIN HFA) 108 (90 Base) MCG/ACT inhaler Inhale 2 puffs into the lungs every 6 (six) hours as needed for wheezing or shortness of breath. 05/24/20  Yes Young, Tarri Fuller D, MD  aspirin 81 MG tablet Take 81 mg by mouth daily.   Yes [provider]  atorvastatin (LIPITOR) 40 MG tablet TAKE ONE TABLET BY MOUTH ONE TIME DAILY AT 6 IN THE EVENING 11/05/20  Yes Jerline Pain, MD  benazepril-hydrochlorthiazide (LOTENSIN HCT) 20-25 MG tablet  TAKE ONE-HALF TABLET BY MOUTH ONE TIME DAILY 12/17/20  Yes Jerline Pain, MD  cetirizine (ZYRTEC) 10 MG tablet Take 10 mg by mouth daily.   Yes [provider]  Cyanocobalamin (B-12 PO) Take 1 tablet by mouth daily.   Yes [provider]  Fluticasone-Umeclidin-Vilant (TRELEGY ELLIPTA) 100-62.5-25 MCG/INH AEPB Inhale 1 puff into the lungs daily. 05/24/20  Yes Young, Tarri Fuller D, MD  Multiple Vitamin (MULTIVITAMIN) tablet Take 1 tablet by mouth daily.   Yes [provider]  omeprazole (PRILOSEC OTC) 20 MG tablet Take 20 mg by mouth daily as needed (heartburn).    Yes [provider]  valACYclovir (VALTREX) 1000 MG tablet 1 g. Patient not taking: Reported on 04/21/2021 08/27/17   [provider]    Current Outpatient Medications  Medication Sig Dispense Refill   albuterol (VENTOLIN HFA) 108 (90 Base) MCG/ACT inhaler Inhale 2 puffs into the lungs every 6 (six) hours as needed for wheezing or shortness of breath. 8 g 12   aspirin 81 MG tablet Take 81 mg by mouth daily.     atorvastatin (LIPITOR) 40 MG tablet TAKE ONE TABLET BY MOUTH ONE TIME DAILY AT 6 IN THE EVENING 90 tablet 2   benazepril-hydrochlorthiazide (LOTENSIN HCT) 20-25 MG tablet TAKE ONE-HALF TABLET BY MOUTH ONE TIME DAILY 45 tablet 3   cetirizine (ZYRTEC) 10 MG tablet Take 10 mg by mouth daily.  Cyanocobalamin (B-12 PO) Take 1 tablet by mouth daily.     Fluticasone-Umeclidin-Vilant (TRELEGY ELLIPTA) 100-62.5-25 MCG/INH AEPB Inhale 1 puff into the lungs daily. 60 each 12   Multiple Vitamin (MULTIVITAMIN) tablet Take 1 tablet by mouth daily.     omeprazole (PRILOSEC OTC) 20 MG tablet Take 20 mg by mouth daily as needed (heartburn).      valACYclovir (VALTREX) 1000 MG tablet 1 g. (Patient not taking: Reported on 04/21/2021)     Current Facility-Administered Medications  Medication Dose Route Frequency Provider Last Rate Last Admin   0.9 %  sodium chloride infusion  500 mL Intravenous Once  Daryel November, MD        Allergies as of 04/21/2021   (No Known Allergies)    Family History  Problem Relation Age of Onset   Stroke Mother    Hypertension Mother    Stroke Father    Hypotension Father    Multiple sclerosis Sister    Thyroid disease Brother     Social History   Socioeconomic History   Marital status: Married    Spouse name: Not on file   Number of children: 0   Years of education: Not on file   Highest education level: Not on file  Occupational History   Occupation: retired  Tobacco Use   Smoking status: Former    Packs/day: 4.00    Years: 28.00    Pack years: 112.00    Types: Cigarettes    Quit date: 12/10/1993    Years since quitting: 27.3   Smokeless tobacco: Never  Vaping Use   Vaping Use: Never used  Substance and Sexual Activity   Alcohol use: Yes    Alcohol/week: 5.0 standard drinks    Types: 5 Standard drinks or equivalent per week    Comment: once every 2 months   Drug use: No   Sexual activity: Not on file  Other Topics Concern   Not on file  Social History Narrative   Not on file   Social Determinants of Health   Financial Resource Strain: Low Risk    Difficulty of Paying Living Expenses: Not hard at all  Food Insecurity: No Food Insecurity   Worried About Charity fundraiser in the Last Year: Never true   [place] in the Last Year: Never true  Transportation Needs: No Transportation Needs   Lack of Transportation (Medical): No   Lack of Transportation (Non-Medical): No  Physical Activity: Insufficiently Active   Days of Exercise per Week: 3 days   Minutes of Exercise per Session: 30 min  Stress: No Stress Concern Present   Feeling of Stress : Not at all  Social Connections: Socially Integrated   Frequency of Communication with Friends and Family: Twice a week   Frequency of Social Gatherings with Friends and Family: Twice a week   Attends Religious Services: 1 to 4 times per year   Active Member of [employer] or  Organizations: Yes   Attends Archivist Meetings: 1 to 4 times per year   Marital Status: Married  Human resources officer Violence: Not At Risk   Fear of Current or Ex-Partner: No   Emotionally Abused: No   Physically Abused: No   Sexually Abused: No    Review of Systems:  All other review of systems negative except as mentioned in the HPI.  Physical Exam: Vital signs BP 128/85   Pulse 78   Temp (!) 96.8 F (36 C) (Skin)  Ht '5\' 10"'$  (1.778 m)   Wt 239 lb (108.4 kg)   SpO2 95%   BMI 34.29 kg/m   General:   Alert,  Well-developed, well-nourished, pleasant and cooperative in NAD MP3 Lungs:  Clear throughout to auscultation.   Heart:  Regular rate and rhythm; no murmurs, clicks, rubs,  or gallops. Abdomen:  Soft, nontender and nondistended. Normal bowel sounds.   Neuro/Psych:  Normal mood and affect. A and O x 3   Vicky Mccanless E. Candis Schatz, MD [facility]

## 2021-04-21 NOTE — Patient Instructions (Addendum)
Await pathology results from upper endoscopy. Resume previous diet and continue present medications. Recommend against further screening colonoscopies given absence of polyps and advanced age.  If anemia worsens, repeat colonoscopy with APC ablation of AVM can be considered.  Pick up prescription for Ferrous Sulfate 325 mg daily from [facility] pharmacy.   YOU HAD AN ENDOSCOPIC PROCEDURE TODAY AT [facility]:   Refer to the procedure report that was given to you for any specific questions about what was found during the examination.  If the procedure report does not answer your questions, please call your gastroenterologist to clarify.  If you requested that your care partner not be given the details of your procedure findings, then the procedure report has been included in a sealed envelope for you to review at your convenience later.  YOU SHOULD EXPECT: Some feelings of bloating in the abdomen. Passage of more gas than usual.  Walking can help get rid of the air that was put into your GI tract during the procedure and reduce the bloating. If you had a lower endoscopy (such as a colonoscopy or flexible sigmoidoscopy) you may notice spotting of blood in your stool or on the toilet paper. If you underwent a bowel prep for your procedure, you may not have a normal bowel movement for a few days.  Please Note:  You might notice some irritation and congestion in your nose or some drainage.  This is from the oxygen used during your procedure.  There is no need for concern and it should clear up in a day or so.  SYMPTOMS TO REPORT IMMEDIATELY:  Following lower endoscopy (colonoscopy or flexible sigmoidoscopy):  Excessive amounts of blood in the stool  Significant tenderness or worsening of abdominal pains  Swelling of the abdomen that is new, acute  Fever of 100F or higher  Following upper endoscopy (EGD)  Vomiting of blood or coffee ground material  New chest pain or pain under the  shoulder blades  Painful or persistently difficult swallowing  New shortness of breath  Fever of 100F or higher  Black, tarry-looking stools  For urgent or emergent issues, a gastroenterologist can be reached at any hour by calling [phone]. Do not use MyChart messaging for urgent concerns.    DIET:  We do recommend a small meal at first, but then you may proceed to your regular diet.  Drink plenty of fluids but you should avoid alcoholic beverages for 24 hours.  ACTIVITY:  You should plan to take it easy for the rest of today and you should NOT DRIVE or use heavy machinery until tomorrow (because of the sedation medicines used during the test).    FOLLOW UP: Our staff will call the number listed on your records 48-72 hours following your procedure to check on you and address any questions or concerns that you may have regarding the information given to you following your procedure. If we do not reach you, we will leave a message.  We will attempt to reach you two times.  During this call, we will ask if you have developed any symptoms of COVID 19. If you develop any symptoms (ie: fever, flu-like symptoms, shortness of breath, cough etc.) before then, please call [phone].  If you test positive for Covid 19 in the 2 weeks post procedure, please call and report this information to [place].    If any biopsies were taken you will be contacted by phone or by letter within the next 1-3 weeks.  Please call us at [phone] if you have not heard about the biopsies in 3 weeks.    SIGNATURES/CONFIDENTIALITY: You and/or your care partner have signed paperwork which will be entered into your electronic medical record.  These signatures attest to the fact that that the information above on your After Visit Summary has been reviewed and is understood.  Full responsibility of the confidentiality of this discharge information lies with you and/or your care-partner.

## 2021-04-21 NOTE — Progress Notes (Signed)
Called to room to assist during endoscopic procedure.  Patient ID and intended procedure confirmed with present staff. Received instructions for my participation in the procedure from the performing physician.  

## 2021-04-21 NOTE — Progress Notes (Signed)
Pt's states no medical or surgical changes since previsit or office visit. VS assessed by [place] ?

## 2021-04-21 NOTE — Progress Notes (Signed)
Pt stable to PACU

## 2021-04-21 NOTE — Op Note (Signed)
[name] Procedure Date: 04/21/2021 1:21 PM MRN: [id number] Endoscopist: Nicki Reaper E. Candis Schatz , MD Age: 75 Referring MD:  Date of Birth: 04-02-1946 Gender: Male Account #: [id number] Procedure:                Colonoscopy Indications:              Iron deficiency anemia Medicines:                Monitored Anesthesia Care Procedure:                Pre-Anesthesia Assessment:                           - Prior to the procedure, a History and Physical                            was performed, and patient medications and                            allergies were reviewed. The patient's tolerance of                            previous anesthesia was also reviewed. The risks                            and benefits of the procedure and the sedation                            options and risks were discussed with the patient.                            All questions were answered, and informed consent                            was obtained. Prior Anticoagulants: The patient has                            taken no previous anticoagulant or antiplatelet                            agents except for aspirin. ASA Grade Assessment:                            III - A patient with severe systemic disease. After                            reviewing the risks and benefits, the patient was                            deemed in satisfactory condition to undergo the                            procedure.  After obtaining informed consent, the colonoscope                            was passed under direct vision. Throughout the                            procedure, the patient's blood pressure, pulse, and                            oxygen saturations were monitored continuously. The                            Colonoscope was introduced through the anus and                            advanced to the the terminal ileum, with                             identification of the appendiceal orifice and IC                            valve. The colonoscopy was somewhat difficult due                            to significant looping and a tortuous colon.                            Successful completion of the procedure was aided by                            using manual pressure. The patient tolerated the                            procedure well. The quality of the bowel                            preparation was good. The terminal ileum, ileocecal                            valve, appendiceal orifice, and rectum were                            photographed. Scope In: 1:46:39 PM Scope Out: 2:03:58 PM Scope Withdrawal Time: 0 hours 10 minutes 34 seconds  Total Procedure Duration: 0 hours 17 minutes 19 seconds  Findings:                 The perianal and digital rectal examinations were                            normal. Pertinent negatives include normal                            sphincter tone and no palpable rectal lesions.  There was a large lipoma, in the proximal                            transverse colon.                           A single medium-sized angiodysplastic lesion                            without bleeding was found in the transverse colon.                           The exam was otherwise normal throughout the                            examined colon.                           The terminal ileum appeared normal.                           The retroflexed view of the distal rectum and anal                            verge was normal and showed no anal or rectal                            abnormalities. Complications:            No immediate complications. Estimated Blood Loss:     Estimated blood loss: none. Impression:               - Large lipoma in the proximal transverse colon.                           - A single non-bleeding colonic angiodysplastic                            lesion. This  could potentially be a source of iron                            deficiency anemia.                           - The examined portion of the ileum was normal.                           - The distal rectum and anal verge are normal on                            retroflexion view.                           - No specimens collected. Recommendation:           - Patient has a contact number available for  emergencies. The signs and symptoms of potential                            delayed complications were discussed with the                            patient. Return to normal activities tomorrow.                            Written discharge instructions were provided to the                            patient.                           - Resume previous diet.                           - Continue present medications.                           - Recommend against further screening colonoscopies                            given absence of polyps and advanced age.                           - If anemia worsens, repeat colonoscopy with APC                            ablation of AVM can be considered. Ronith Berti E. Candis Schatz, MD 04/21/2021 2:16:13 PM This report has been signed electronically.

## 2021-04-21 NOTE — Op Note (Signed)
[facility] Patient Name: Kyle [name] Procedure Date: 04/21/2021 1:22 PM MRN: [id number] Endoscopist: Nicki Reaper E. Candis Schatz , MD Age: 75 Referring MD:  Date of Birth: 1946/06/30 Gender: Male Account #: [id number] Procedure:                Upper GI endoscopy Indications:              Iron deficiency anemia Medicines:                Monitored Anesthesia Care Procedure:                Pre-Anesthesia Assessment:                           - Prior to the procedure, a History and Physical                            was performed, and patient medications and                            allergies were reviewed. The patient's tolerance of                            previous anesthesia was also reviewed. The risks                            and benefits of the procedure and the sedation                            options and risks were discussed with the patient.                            All questions were answered, and informed consent                            was obtained. Prior Anticoagulants: The patient has                            taken no previous anticoagulant or antiplatelet                            agents except for aspirin. ASA Grade Assessment:                            III - A patient with severe systemic disease. After                            reviewing the risks and benefits, the patient was                            deemed in satisfactory condition to undergo the                            procedure.  After obtaining informed consent, the endoscope was                            passed under direct vision. Throughout the                            procedure, the patient's blood pressure, pulse, and                            oxygen saturations were monitored continuously. The                            Endoscope was introduced through the mouth, and                            advanced to the second part of duodenum. The upper                             GI endoscopy was accomplished without difficulty.                            The patient tolerated the procedure well. Scope In: Scope Out: Findings:                 The examined portions of the nasopharynx,                            oropharynx and larynx were normal.                           The examined esophagus was normal.                           Diffuse mildly congested mucosa was found in the                            entire examined stomach. Estimated blood loss was                            minimal. Estimated blood loss was minimal.                           A few small sessile polyps with no stigmata of                            recent bleeding were found in the gastric body. The                            polyp was removed with a cold biopsy forceps.                            Resection and retrieval were complete. Estimated  blood loss was minimal.                           The exam of the stomach was otherwise normal.                           The examined duodenum was normal. Biopsies for                            histology were taken with a cold forceps for                            evaluation of celiac disease. Estimated blood loss                            was minimal. Complications:            No immediate complications. Estimated Blood Loss:     Estimated blood loss was minimal. Impression:               - The examined portions of the nasopharynx,                            oropharynx and larynx were normal.                           - Normal esophagus.                           - Congestive gastropathy.                           - A few gastric polyps. Resected and retrieved.                           - Normal examined duodenum. Biopsied.                           - No endoscopic abnormalities to explain iron                            deficiency anemia. Recommendation:           - Patient has a contact number  available for                            emergencies. The signs and symptoms of potential                            delayed complications were discussed with the                            patient. Return to normal activities tomorrow.                            Written discharge instructions were provided to the  patient.                           - Resume previous diet.                           - Continue present medications.                           - Await pathology results. Kania Regnier E. Candis Schatz, MD 04/21/2021 2:10:46 PM This report has been signed electronically.

## 2021-04-22 ENCOUNTER — Telehealth (HOSPITAL_COMMUNITY): Payer: Self-pay

## 2021-04-22 NOTE — Telephone Encounter (Signed)
Called pt to see if he was coming to orientation tomorrow. Pt said he was. Instructed pt to wear closed toed shoes and mentioned we would do a COVID screening. Explained to pt where to find the department and to call us if he could not find [place] or could not make his appointment. Gave pt department number.

## 2021-04-23 ENCOUNTER — Other Ambulatory Visit: Payer: Self-pay

## 2021-04-23 ENCOUNTER — Encounter (HOSPITAL_COMMUNITY)
Admission: RE | Admit: 2021-04-23 | Discharge: 2021-04-23 | Disposition: A | Discharge status: Home / Self Care | Payer: Medicare HMO | Source: Ambulatory Visit | Attending: Internal Medicine | Admitting: Internal Medicine

## 2021-04-23 ENCOUNTER — Encounter (HOSPITAL_COMMUNITY): Payer: Self-pay

## 2021-04-23 ENCOUNTER — Telehealth: Payer: Self-pay | Admitting: *Deleted

## 2021-04-23 VITALS — BP 130/70 | HR 71 | Ht 70.5 in | Wt 239.6 lb

## 2021-04-23 DIAGNOSIS — J449 Chronic obstructive pulmonary disease, unspecified: Secondary | ICD-10-CM | POA: Insufficient documentation

## 2021-04-23 NOTE — Progress Notes (Signed)
Kyle [name] 75 y.o. male Pulmonary Rehab Orientation Note This patient who was referred to Pulmonary rehab by Dr. Annamaria Boots with the diagnosis of   COPD stage 3 arrived today in Cardiac and Pulmonary Rehab. He arrived ambulatory with normal gait. He does not carry portable oxygen. Per pt, he uses oxygen never. Color good, skin warm and dry. Patient is oriented to time and place. Patient's medical history, psychosocial health, and medications reviewed. Psychosocial assessment reveals pt lives with their spouse. Pt is currently retired. Pt hobbies include golfing, puzzles and breakfast with friends. Pt reports his stress level is low. Pt can not think of any stress in his life. Pt does not exhibit signs of depression.  PHQ2/9 score 0/0. Pt shows good  coping skills with positive outlook .  Will continue to monitor and evaluate for psychosocial  concerns and barriers.Physical assessment reveals heart rate is normal, breath sounds clear to auscultation, no wheezes, rales, or rhonchi. Grip strength equal, strong. Distal pulses 3+ bilateral posterior tibial pulses present without peripheral edema Patient reports he does take medications as prescribed. Patient states he  follows a Regular diet. The patient reports no specific efforts to gain or lose weight.. Patient's weight will be monitored closely. Demonstration and practice of PLB using pulse oximeter. Patient able to return demonstration satisfactorily. Safety and hand hygiene in the exercise area reviewed with patient. Patient voices understanding of the information reviewed. Department expectations discussed with patient and achievable goals were set. The patient shows enthusiasm about attending the program and we look forward to working with this nice gentleman.The patient completed a 6 min walk test on 04/23/21 and to begin exercise on 04/29/21 in the 10:15 class.  [id number]

## 2021-04-23 NOTE — Telephone Encounter (Signed)
  Follow up Call-  Call back number 04/21/2021  Post procedure Call Back phone  # [phone]  Permission to leave phone message Yes  Some recent data might be hidden     Patient questions:  Do you have a fever, pain , or abdominal swelling? No. Pain Score  0 *  Have you tolerated food without any problems? Yes.    Have you been able to return to your normal activities? Yes.    Do you have any questions about your discharge instructions: Diet   No. Medications  No. Follow up visit  No.  Do you have questions or concerns about your Care? No.  Actions: * If pain score is 4 or above: No action needed, pain <4.  Have you developed a fever since your procedure? no  2.   Have you had an respiratory symptoms (SOB or cough) since your procedure? no  3.   Have you tested positive for COVID 19 since your procedure no  4.   Have you had any family members/close contacts diagnosed with the COVID 19 since your procedure?  no   If yes to any of these questions please route to Joylene John, RN and Joella Prince, RN

## 2021-04-23 NOTE — Progress Notes (Signed)
Pulmonary Individual Treatment Plan  Patient Details  Name: Kyle [name] MRN: [id number] Date of Birth: [birth year] Referring Provider:   April Manson Pulmonary Rehab Walk Test from 04/23/2021 in [place]  Referring Provider Dr. Annamaria Boots       Initial Encounter Date:  Flowsheet Row Pulmonary Rehab Walk Test from 04/23/2021 in [place]  Date 04/23/21       Visit Diagnosis: COPD mixed type ([place])  Patient's Home Medications on Admission:   Current Outpatient Medications:    albuterol (VENTOLIN HFA) 108 (90 Base) MCG/ACT inhaler, Inhale 2 puffs into the lungs every 6 (six) hours as needed for wheezing or shortness of breath., Disp: 8 g, Rfl: 12   aspirin 81 MG tablet, Take 81 mg by mouth daily., Disp: , Rfl:    atorvastatin (LIPITOR) 40 MG tablet, TAKE ONE TABLET BY MOUTH ONE TIME DAILY AT 6 IN THE EVENING, Disp: 90 tablet, Rfl: 2   benazepril-hydrochlorthiazide (LOTENSIN HCT) 20-25 MG tablet, TAKE ONE-HALF TABLET BY MOUTH ONE TIME DAILY, Disp: 45 tablet, Rfl: 3   cetirizine (ZYRTEC) 10 MG tablet, Take 10 mg by mouth daily., Disp: , Rfl:    Cyanocobalamin (B-12 PO), Take 1 tablet by mouth daily., Disp: , Rfl:    ferrous sulfate 325 (65 FE) MG tablet, Take 1 tablet (325 mg total) by mouth daily with breakfast., Disp: 90 tablet, Rfl: 3   Fluticasone-Umeclidin-Vilant (TRELEGY ELLIPTA) 100-62.5-25 MCG/INH AEPB, Inhale 1 puff into the lungs daily., Disp: 60 each, Rfl: 12   Multiple Vitamin (MULTIVITAMIN) tablet, Take 1 tablet by mouth daily., Disp: , Rfl:    valACYclovir (VALTREX) 1000 MG tablet, 1 g., Disp: , Rfl:    omeprazole (PRILOSEC OTC) 20 MG tablet, Take 20 mg by mouth daily as needed (heartburn). , Disp: , Rfl:   Past Medical History: Past Medical History:  Diagnosis Date   Anemia    CAD (coronary artery disease) 08/03/2009   Cardiac arrhythmia    COPD (chronic obstructive pulmonary disease) (HCC)    GERD  (gastroesophageal reflux disease)    Hyperlipidemia    Hypertension    Hypothyroidism    Myocardial infarction ([place]) 08/03/2009   Sleep apnea treated with nocturnal BiPAP     Tobacco Use: Social History   Tobacco Use  Smoking Status Former   Packs/day: 4.00   Years: 28.00   Pack years: 112.00   Types: Cigarettes   Quit date: 12/10/1993   Years since quitting: 27.3  Smokeless Tobacco Never    Labs: Recent Review Flowsheet Data     Labs for ITP Cardiac and Pulmonary Rehab Latest Ref Rng & Units 09/15/2017 04/19/2018 10/31/2019 08/09/2020 11/04/2020   Cholestrol 0 - 200 mg/dL 89 96 97 103 -   LDLCALC 0 - 99 mg/dL 46 56 52 43 -   HDL >39.00 mg/dL 26.30(L) 24.60(L) 24.90(L) 23.30(L) -   Trlycerides 0.0 - 149.0 mg/dL 86.0 78.0 97.0 184.0(H) -   Hemoglobin A1c 4.6 - 6.5 % - - - 6.9(H) 7.1(H)   TCO2 0 - 100 mmol/L - - - - -       Capillary Blood Glucose: No results found for: GLUCAP   Pulmonary Assessment Scores:  Pulmonary Assessment Scores     Row Name 04/23/21 0955         ADL UCSD   ADL Phase Entry     SOB Score total 27           CAT Score  CAT Score 9           mMRC Score   mMRC Score 2             UCSD: Self-administered rating of dyspnea associated with activities of daily living (ADLs) 6-point scale (0 = "not at all" to 5 = "maximal or unable to do because of breathlessness")  Scoring Scores range from 0 to 120.  Minimally important difference is 5 units  CAT: CAT can identify the health impairment of COPD patients and is better correlated with disease progression.  CAT has a scoring range of zero to 40. The CAT score is classified into four groups of low (less than 10), medium (10 - 20), high (21-30) and very high (31-40) based on the impact level of disease on health status. A CAT score over 10 suggests significant symptoms.  A worsening CAT score could be explained by an exacerbation, poor medication adherence, poor inhaler technique, or progression  of COPD or comorbid conditions.  CAT MCID is 2 points  mMRC: mMRC (Modified Medical Research Council) Dyspnea Scale is used to assess the degree of baseline functional disability in patients of respiratory disease due to dyspnea. No minimal important difference is established. A decrease in score of 1 point or greater is considered a positive change.   Pulmonary Function Assessment:  Pulmonary Function Assessment - 04/23/21 0954       Breath   Bilateral Breath Sounds Clear    Shortness of Breath Limiting activity;Yes             Exercise Target Goals: Exercise Program Goal: Individual exercise prescription set using results from initial 6 min walk test and THRR while considering  patient's activity barriers and safety.   Exercise Prescription Goal: Initial exercise prescription builds to 30-45 minutes a day of aerobic activity, 2-3 days per week.  Home exercise guidelines will be given to patient during program as part of exercise prescription that the participant will acknowledge.  Activity Barriers & Risk Stratification:  Activity Barriers & Cardiac Risk Stratification - 04/23/21 0948       Activity Barriers & Cardiac Risk Stratification   Activity Barriers Deconditioning;Shortness of Breath;Muscular Weakness             6 Minute Walk:  6 Minute Walk     Row Name 04/23/21 1132         6 Minute Walk   Phase Initial     Distance 1135 feet     Walk Time 6 minutes     # of Rest Breaks 0     MPH 2.15     METS 2.02     RPE 7     Perceived Dyspnea  1     VO2 Peak 7.07     Symptoms No     Resting HR 71 bpm     Resting BP 130/70     Resting Oxygen Saturation  95 %     Exercise Oxygen Saturation  during 6 min walk 94 %     Max Ex. HR 97 bpm     Max Ex. BP 132/70     2 Minute Post BP 128/64           Interval HR   1 Minute HR 65     2 Minute HR 65     3 Minute HR 80     4 Minute HR 91     5 Minute HR 91     6  Minute HR 97     2 Minute Post HR 80      Interval Heart Rate? Yes           Interval Oxygen   Interval Oxygen? Yes     Baseline Oxygen Saturation % 95 %     1 Minute Oxygen Saturation % 97 %     1 Minute Liters of Oxygen 0 L     2 Minute Oxygen Saturation % 96 %     2 Minute Liters of Oxygen 0 L     3 Minute Oxygen Saturation % 94 %     3 Minute Liters of Oxygen 0 L     4 Minute Oxygen Saturation % 94 %     4 Minute Liters of Oxygen 0 L     5 Minute Oxygen Saturation % 95 %     5 Minute Liters of Oxygen 0 L     6 Minute Oxygen Saturation % 96 %     6 Minute Liters of Oxygen 0 L     2 Minute Post Oxygen Saturation % 97 %     2 Minute Post Liters of Oxygen 0 L              Oxygen Initial Assessment:  Oxygen Initial Assessment - 04/23/21 0952       Home Oxygen   Home Oxygen Device None    Sleep Oxygen Prescription BiPAP    Liters per minute 0    Home Exercise Oxygen Prescription None    Home Resting Oxygen Prescription None    Compliance with Home Oxygen Use Yes   with Bipap use     Initial 6 min Walk   Oxygen Used None      Program Oxygen Prescription   Program Oxygen Prescription None      Intervention   Short Term Goals To learn and exhibit compliance with exercise, home and travel O2 prescription;To learn and understand importance of monitoring SPO2 with pulse oximeter and demonstrate accurate use of the pulse oximeter.;To learn and understand importance of maintaining oxygen saturations>88%;To learn and demonstrate proper pursed lip breathing techniques or other breathing techniques. ;To learn and demonstrate proper use of respiratory medications    Long  Term Goals Exhibits compliance with exercise, home  and travel O2 prescription;Verbalizes importance of monitoring SPO2 with pulse oximeter and return demonstration;Maintenance of O2 saturations>88%;Exhibits proper breathing techniques, such as pursed lip breathing or other method taught during program session;Compliance with respiratory  medication;Demonstrates proper use of MDI's             Oxygen Re-Evaluation:   Oxygen Discharge (Final Oxygen Re-Evaluation):   Initial Exercise Prescription:  Initial Exercise Prescription - 04/23/21 1100       Date of Initial Exercise RX and Referring Provider   Date 04/23/21    Referring Provider Dr. Annamaria Boots    Expected Discharge Date 06/24/21      NuStep   Level 2    SPM 80    Minutes 15      Track   Minutes 15      Prescription Details   Frequency (times per week) 2    Duration Progress to 30 minutes of continuous aerobic without signs/symptoms of physical distress      Intensity   THRR 40-80% of Max Heartrate 58-117    Ratings of Perceived Exertion 11-13    Perceived Dyspnea 0-4      Progression   Progression Continue to progress  workloads to maintain intensity without signs/symptoms of physical distress.      Resistance Training   Training Prescription Yes    Weight Blue bands    Reps 10-15             Perform Capillary Blood Glucose checks as needed.  Exercise Prescription Changes:   Exercise Comments:   Exercise Goals and Review:   Exercise Goals     Row Name 04/23/21 1137             Exercise Goals   Increase Physical Activity Yes       Intervention Provide advice, education, support and counseling about physical activity/exercise needs.;Develop an individualized exercise prescription for aerobic and resistive training based on initial evaluation findings, risk stratification, comorbidities and participant's personal goals.       Expected Outcomes Short Term: Attend rehab on a regular basis to increase amount of physical activity.;Long Term: Add in home exercise to make exercise part of routine and to increase amount of physical activity.;Long Term: Exercising regularly at least 3-5 days a week.       Increase Strength and Stamina Yes       Intervention Provide advice, education, support and counseling about physical  activity/exercise needs.;Develop an individualized exercise prescription for aerobic and resistive training based on initial evaluation findings, risk stratification, comorbidities and participant's personal goals.       Expected Outcomes Short Term: Increase workloads from initial exercise prescription for resistance, speed, and METs.;Short Term: Perform resistance training exercises routinely during rehab and add in resistance training at home;Long Term: Improve cardiorespiratory fitness, muscular endurance and strength as measured by increased METs and functional capacity (6MWT)       Able to understand and use rate of perceived exertion (RPE) scale Yes       Intervention Provide education and explanation on how to use RPE scale       Expected Outcomes Short Term: Able to use RPE daily in rehab to express subjective intensity level;Long Term:  Able to use RPE to guide intensity level when exercising independently       Able to understand and use Dyspnea scale Yes       Intervention Provide education and explanation on how to use Dyspnea scale       Expected Outcomes Short Term: Able to use Dyspnea scale daily in rehab to express subjective sense of shortness of breath during exertion;Long Term: Able to use Dyspnea scale to guide intensity level when exercising independently       Knowledge and understanding of Target Heart Rate Range (THRR) Yes       Intervention Provide education and explanation of THRR including how the numbers were predicted and where they are located for reference       Expected Outcomes Short Term: Able to state/look up THRR;Short Term: Able to use daily as guideline for intensity in rehab;Long Term: Able to use THRR to govern intensity when exercising independently       Understanding of Exercise Prescription Yes       Intervention Provide education, explanation, and written materials on patient's individual exercise prescription       Expected Outcomes Short Term: Able to  explain program exercise prescription;Long Term: Able to explain home exercise prescription to exercise independently                Exercise Goals Re-Evaluation :   Discharge Exercise Prescription (Final Exercise Prescription Changes):   Nutrition:  Target Goals: Understanding of  nutrition guidelines, daily intake of sodium 1500mg , cholesterol 200mg , calories 30% from fat and 7% or less from saturated fats, daily to have 5 or more servings of fruits and vegetables.  Biometrics:  Pre Biometrics - 04/23/21 0950       Pre Biometrics   Height 5' 10.5" (1.791 m)    Weight 108.7 kg    BMI (Calculated) 33.89    Grip Strength 37 kg              Nutrition Therapy Plan and Nutrition Goals:   Nutrition Assessments:  MEDIFICTS Score Key: ?70 Need to make dietary changes  40-70 Heart Healthy Diet ? 40 Therapeutic Level Cholesterol Diet   Picture Your Plate Scores: <40 Unhealthy dietary pattern with much room for improvement. 41-50 Dietary pattern unlikely to meet recommendations for good health and room for improvement. 51-60 More healthful dietary pattern, with some room for improvement.  >60 Healthy dietary pattern, although there may be some specific behaviors that could be improved.    Nutrition Goals Re-Evaluation:   Nutrition Goals Discharge (Final Nutrition Goals Re-Evaluation):   Psychosocial: Target Goals: Acknowledge presence or absence of significant depression and/or stress, maximize coping skills, provide positive support system. Participant is able to verbalize types and ability to use techniques and skills needed for reducing stress and depression.  Initial Review & Psychosocial Screening:  Initial Psych Review & Screening - 04/23/21 0955       Initial Review   Current issues with None Identified      Family Dynamics   Good Support System? Yes   wife     Barriers   Psychosocial barriers to participate in program There are no identifiable  barriers or psychosocial needs.      Screening Interventions   Interventions Encouraged to exercise             Quality of Life Scores:  Scores of 19 and below usually indicate a poorer quality of life in these areas.  A difference of  2-3 points is a clinically meaningful difference.  A difference of 2-3 points in the total score of the Quality of Life Index has been associated with significant improvement in overall quality of life, self-image, physical symptoms, and general health in studies assessing change in quality of life.  PHQ-9: Recent Review Flowsheet Data     Depression screen [facility] 2/9 04/23/2021 04/23/2021 10/16/2020 04/19/2018 03/13/2016   Decreased Interest 0 0 0 0 0   Down, Depressed, Hopeless 0 0 0 0 0   PHQ - 2 Score 0 0 0 0 0   Altered sleeping 0 - - - -   Tired, decreased energy 0 - - - -   Change in appetite 0 - - - -   Feeling bad or failure about yourself  0 - - - -   Trouble concentrating 0 - - - -   Moving slowly or fidgety/restless 0 - - - -   Suicidal thoughts 0 - - - -   PHQ-9 Score 0 - - - -   Difficult doing work/chores Not difficult at all - - - -      Interpretation of Total Score  Total Score Depression Severity:  1-4 = Minimal depression, 5-9 = Mild depression, 10-14 = Moderate depression, 15-19 = Moderately severe depression, 20-27 = Severe depression   Psychosocial Evaluation and Intervention:  Psychosocial Evaluation - 04/23/21 0957       Psychosocial Evaluation & Interventions   Interventions Encouraged to exercise  with the program and follow exercise prescription    Comments No barriers or concerns identified at this time    Expected Outcomes for pt to continue to have no barriers or concerns during pulmonary rehab    Continue Psychosocial Services  No Follow up required             Psychosocial Re-Evaluation:   Psychosocial Discharge (Final Psychosocial Re-Evaluation):   Education: Education Goals: Education classes will  be provided on a weekly basis, covering required topics. Participant will state understanding/return demonstration of topics presented.  Learning Barriers/Preferences:  Learning Barriers/Preferences - 04/23/21 0959       Learning Barriers/Preferences   Learning Barriers None    Learning Preferences Computer/Internet;Group Instruction;Individual Instruction;Pictoral;Skilled Demonstration;Verbal Instruction;Video;Written Material;Audio             Education Topics: Risk Factor Reduction:  -Group instruction that is supported by a PowerPoint presentation. Instructor discusses the definition of a risk factor, different risk factors for pulmonary disease, and how the heart and lungs work together.     Nutrition for Pulmonary Patient:  -Group instruction provided by PowerPoint slides, verbal discussion, and written materials to support subject matter. The instructor gives an explanation and review of healthy diet recommendations, which includes a discussion on weight management, recommendations for fruit and vegetable consumption, as well as protein, fluid, caffeine, fiber, sodium, sugar, and alcohol. Tips for eating when patients are short of breath are discussed.   Pursed Lip Breathing:  -Group instruction that is supported by demonstration and informational handouts. Instructor discusses the benefits of pursed lip and diaphragmatic breathing and detailed demonstration on how to preform both.     Oxygen Safety:  -Group instruction provided by PowerPoint, verbal discussion, and written material to support subject matter. There is an overview of "What is Oxygen" and "Why do we need it".  Instructor also reviews how to create a safe environment for oxygen use, the importance of using oxygen as prescribed, and the risks of noncompliance. There is a brief discussion on traveling with oxygen and resources the patient may utilize.   Oxygen Equipment:  -Group instruction provided by [facility]  Staff utilizing handouts, written materials, and equipment demonstrations.   Signs and Symptoms:  -Group instruction provided by written material and verbal discussion to support subject matter. Warning signs and symptoms of infection, stroke, and heart attack are reviewed and when to call the physician/911 reinforced. Tips for preventing the spread of infection discussed.   Advanced Directives:  -Group instruction provided by verbal instruction and written material to support subject matter. Instructor reviews Advanced Directive laws and proper instruction for filling out document.   Pulmonary Video:  -Group video education that reviews the importance of medication and oxygen compliance, exercise, good nutrition, pulmonary hygiene, and pursed lip and diaphragmatic breathing for the pulmonary patient.   Exercise for the Pulmonary Patient:  -Group instruction that is supported by a PowerPoint presentation. Instructor discusses benefits of exercise, core components of exercise, frequency, duration, and intensity of an exercise routine, importance of utilizing pulse oximetry during exercise, safety while exercising, and options of places to exercise outside of rehab.     Pulmonary Medications:  -Verbally interactive group education provided by instructor with focus on inhaled medications and proper administration.   Anatomy and Physiology of the Respiratory System and Intimacy:  -Group instruction provided by PowerPoint, verbal discussion, and written material to support subject matter. Instructor reviews respiratory cycle and anatomical components of the respiratory system and their functions. Instructor  also reviews differences in obstructive and restrictive respiratory diseases with examples of each. Intimacy, Sex, and Sexuality differences are reviewed with a discussion on how relationships can change when diagnosed with pulmonary disease. Common sexual concerns are reviewed.   MD DAY -A  group question and answer session with a medical doctor that allows participants to ask questions that relate to their pulmonary disease state.   OTHER EDUCATION -Group or individual verbal, written, or video instructions that support the educational goals of the pulmonary rehab program.   Holiday Eating Survival Tips:  -Group instruction provided by PowerPoint slides, verbal discussion, and written materials to support subject matter. The instructor gives patients tips, tricks, and techniques to help them not only survive but enjoy the holidays despite the onslaught of food that accompanies the holidays.   Knowledge Questionnaire Score:  Knowledge Questionnaire Score - 04/23/21 1016       Knowledge Questionnaire Score   Pre Score 13/18             Core Components/Risk Factors/Patient Goals at Admission:  Personal Goals and Risk Factors at Admission - 04/23/21 1000       Core Components/Risk Factors/Patient Goals on Admission   Improve shortness of breath with ADL's Yes    Intervention Provide education, individualized exercise plan and daily activity instruction to help decrease symptoms of SOB with activities of daily living.    Expected Outcomes Short Term: Improve cardiorespiratory fitness to achieve a reduction of symptoms when performing ADLs;Long Term: Be able to perform more ADLs without symptoms or delay the onset of symptoms             Core Components/Risk Factors/Patient Goals Review:   Goals and Risk Factor Review     Row Name 04/23/21 1002             Core Components/Risk Factors/Patient Goals Review   Personal Goals Review Increase knowledge of respiratory medications and ability to use respiratory devices properly.;Improve shortness of breath with ADL's;Develop more efficient breathing techniques such as purse lipped breathing and diaphragmatic breathing and practicing self-pacing with activity.                Core Components/Risk  Factors/Patient Goals at Discharge (Final Review):   Goals and Risk Factor Review - 04/23/21 1002       Core Components/Risk Factors/Patient Goals Review   Personal Goals Review Increase knowledge of respiratory medications and ability to use respiratory devices properly.;Improve shortness of breath with ADL's;Develop more efficient breathing techniques such as purse lipped breathing and diaphragmatic breathing and practicing self-pacing with activity.             ITP Comments:   Comments:

## 2021-04-23 NOTE — Progress Notes (Signed)
Kyle [name] 75 y.o. male  Initial Psychosocial Assessment  Pt psychosocial assessment reveals pt lives with their spouse. Pt is currently retired as a Development worker, community. Pt hobbies include golf, puzzles breakfast with friends. Pt reports his stress level is low. Pt states that he can not think of any stress in his life.  Pt does not exhibit signs of depression. Pt shows good  coping skills with positive outlook . Monitor and evaluate progress toward psychosocial goal(s). Pt has no psychosocial concerns or barriers for participation in Pulmonary Rehab. His wife is very supportive and has no transportation or financial issues. We will reevaluate for issues every 30 days while participating in the program.  Goal(s): Help patient work toward returning to meaningful activities that improve patient's QOL and are attainable with patient's lung disease Patient to continue to have no barriers or concerns while in Pulmonary Rehab  04/23/2021 10:39 AM

## 2021-04-25 ENCOUNTER — Ambulatory Visit (HOSPITAL_COMMUNITY): Payer: Medicare HMO

## 2021-04-29 ENCOUNTER — Ambulatory Visit (HOSPITAL_COMMUNITY): Payer: Medicare HMO

## 2021-04-29 ENCOUNTER — Encounter (HOSPITAL_COMMUNITY)
Admission: RE | Admit: 2021-04-29 | Discharge: 2021-04-29 | Disposition: A | Discharge status: Home / Self Care | Payer: Medicare HMO | Source: Ambulatory Visit | Attending: Internal Medicine | Admitting: Internal Medicine

## 2021-04-29 ENCOUNTER — Other Ambulatory Visit: Payer: Self-pay

## 2021-04-29 DIAGNOSIS — J449 Chronic obstructive pulmonary disease, unspecified: Secondary | ICD-10-CM | POA: Diagnosis not present

## 2021-04-29 NOTE — Progress Notes (Signed)
Daily Session Note  Patient Details  Name: Kyle [name] MRN: [id number] Date of Birth: 05-28-1946 Referring Provider:   April Manson [facility] Rehab Walk Test from 04/23/2021 in [place]  Referring Provider Dr. Annamaria Boots       Encounter Date: 04/29/2021  Check In:  Session Check In - 04/29/21 1037       Check-In   Supervising physician immediately available to respond to emergencies [facility] immediately available    Physician(s) Dr. Florene Glen    Location [facility]    Staff Present Rosebud Poles, RN, Quentin Ore, MS, ACSM-CEP, Exercise Physiologist;Lisa Ysidro Evert, RN    Virtual Visit No    Medication changes reported     No    Fall or balance concerns reported    No    Tobacco Cessation No Change    Warm-up and Cool-down Performed as group-led instruction    Resistance Training Performed Yes    VAD Patient? No    PAD/SET Patient? No      Pain Assessment   Currently in Pain? No/denies    Multiple Pain Sites No             Capillary Blood Glucose: No results found for this or any previous visit (from the past 24 hour(s)).    Social History   Tobacco Use  Smoking Status Former   Packs/day: 4.00   Years: 28.00   Pack years: 112.00   Types: Cigarettes   Quit date: 12/10/1993   Years since quitting: 27.4  Smokeless Tobacco Never    Goals Met:  Proper associated with RPD/PD & O2 Sat Exercise tolerated well No report of concerns or symptoms today Strength training completed today  Goals Unmet:  Not Applicable  Comments: Service time is from 1023 to 1133. Completed 1st day of exercise.    Dr. Fransico Him is Medical Director for Cardiac Rehab at [facility].

## 2021-04-30 ENCOUNTER — Encounter: Payer: Self-pay | Admitting: Gastroenterology

## 2021-05-01 ENCOUNTER — Other Ambulatory Visit: Payer: Self-pay

## 2021-05-01 ENCOUNTER — Encounter (HOSPITAL_COMMUNITY)
Admission: RE | Admit: 2021-05-01 | Discharge: 2021-05-01 | Disposition: A | Discharge status: Home / Self Care | Payer: Medicare HMO | Source: Ambulatory Visit | Attending: Internal Medicine | Admitting: Internal Medicine

## 2021-05-01 DIAGNOSIS — J449 Chronic obstructive pulmonary disease, unspecified: Secondary | ICD-10-CM

## 2021-05-01 NOTE — Progress Notes (Signed)
Daily Session Note  Patient Details  Name: Kyle [name] MRN: [id number] Date of Birth: [birth year] Referring Provider:   April Manson [facility] Rehab Walk Test from 04/23/2021 in [place]  Referring Provider Dr. Annamaria Boots       Encounter Date: 05/01/2021  Check In:  Session Check In - 05/01/21 1123       Check-In   Supervising physician immediately available to respond to emergencies [facility] immediately available    Physician(s) Dr. Silvio Clayman    [facility]    Staff Present Rosebud Poles, RN, Quentin Ore, MS, ACSM-CEP, Exercise Physiologist;Lisa Ysidro Evert, RN    Virtual Visit No    Medication changes reported     No    Fall or balance concerns reported    No    Tobacco Cessation No Change    Warm-up and Cool-down Performed as group-led instruction    Resistance Training Performed Yes    VAD Patient? No    PAD/SET Patient? No      Pain Assessment   Currently in Pain? No/denies    Multiple Pain Sites No             Capillary Blood Glucose: No results found for this or any previous visit (from the past 24 hour(s)).    Social History   Tobacco Use  Smoking Status Former   Packs/day: 4.00   Years: 28.00   Pack years: 112.00   Types: Cigarettes   Quit date: 12/10/1993   Years since quitting: 27.4  Smokeless Tobacco Never    Goals Met:  Proper associated with RPD/PD & O2 Sat Independence with exercise equipment Exercise tolerated well No report of concerns or symptoms today Strength training completed today  Goals Unmet:  Not Applicable  Comments: Service time is from 1014 to 1146.    Dr. Fransico Him is Medical Director for Cardiac Rehab at [facility].

## 2021-05-06 ENCOUNTER — Encounter (HOSPITAL_COMMUNITY)
Admission: RE | Admit: 2021-05-06 | Discharge: 2021-05-06 | Disposition: A | Discharge status: Home / Self Care | Payer: Medicare HMO | Source: Ambulatory Visit | Attending: Internal Medicine | Admitting: Internal Medicine

## 2021-05-06 ENCOUNTER — Other Ambulatory Visit: Payer: Self-pay

## 2021-05-06 VITALS — Wt 236.8 lb

## 2021-05-06 DIAGNOSIS — Z87891 Personal history of nicotine dependence: Secondary | ICD-10-CM | POA: Diagnosis not present

## 2021-05-06 DIAGNOSIS — J449 Chronic obstructive pulmonary disease, unspecified: Secondary | ICD-10-CM | POA: Diagnosis not present

## 2021-05-06 NOTE — Progress Notes (Signed)
Daily Session Note  Patient Details  Name: Kyle [name] MRN: [id number] Date of Birth: [birth year] Referring Provider:   April Manson [facility]monary Rehab Walk Test from 04/23/2021 in [place]  Referring Provider Dr. Annamaria Boots       Encounter Date: 05/06/2021  Check In:  Session Check In - 05/06/21 1119       Check-In   Supervising physician immediately available to respond to emergencies [facility] immediately available    Physician(s) Dr. Gerlean Ren    Location [facility]    Staff Present Rosebud Poles, RN, Milus Glazier, MS, ACSM-CEP, CCRP, Exercise Physiologist;Kaylee Rosana Hoes, MS, ACSM-CEP, Exercise Physiologist;Arbie Reisz Ysidro Evert, RN    Virtual Visit No    Medication changes reported     No    Fall or balance concerns reported    No    Tobacco Cessation No Change    Warm-up and Cool-down Performed as group-led instruction    Resistance Training Performed Yes    VAD Patient? No    PAD/SET Patient? No      Pain Assessment   Currently in Pain? No/denies    Multiple Pain Sites No             Capillary Blood Glucose: No results found for this or any previous visit (from the past 24 hour(s)).   Exercise Prescription Changes - 05/06/21 1200       Response to Exercise   Blood Pressure (Admit) 132/58    Blood Pressure (Exercise) 112/60    Blood Pressure (Exit) 126/70    Heart Rate (Admit) 80 bpm    Heart Rate (Exercise) 102 bpm    Heart Rate (Exit) 80 bpm    Oxygen Saturation (Admit) 95 %    Oxygen Saturation (Exercise) 92 %    Oxygen Saturation (Exit) 96 %    Rating of Perceived Exertion (Exercise) 9    Perceived Dyspnea (Exercise) 1    Duration Continue with 30 min of aerobic exercise without signs/symptoms of physical distress.    Intensity Other (comment)   40-80% of HRR     Progression   Progression Continue to progress workloads to maintain intensity without signs/symptoms of physical distress.       Resistance Training   Training Prescription Yes    Weight Blue bands    Reps 10-15    Time 10 Minutes      NuStep   Level 2    SPM 80    Minutes 15    METs 1.9      Track   Laps 18    Minutes 15             Social History   Tobacco Use  Smoking Status Former   Packs/day: 4.00   Years: 28.00   Pack years: 112.00   Types: Cigarettes   Quit date: 12/10/1993   Years since quitting: 27.4  Smokeless Tobacco Never    Goals Met:  Exercise tolerated well No report of concerns or symptoms today Strength training completed today  Goals Unmet:  Not Applicable  Comments: Service time is from 1015 to 1140    Dr. Fransico Him is Medical Director for Cardiac Rehab at [facility].

## 2021-05-08 ENCOUNTER — Encounter (HOSPITAL_COMMUNITY)
Admission: RE | Admit: 2021-05-08 | Discharge: 2021-05-08 | Disposition: A | Discharge status: Home / Self Care | Payer: Medicare HMO | Source: Ambulatory Visit | Attending: Internal Medicine | Admitting: Internal Medicine

## 2021-05-08 ENCOUNTER — Other Ambulatory Visit: Payer: Self-pay

## 2021-05-08 DIAGNOSIS — J449 Chronic obstructive pulmonary disease, unspecified: Secondary | ICD-10-CM

## 2021-05-08 DIAGNOSIS — Z87891 Personal history of nicotine dependence: Secondary | ICD-10-CM | POA: Diagnosis not present

## 2021-05-08 NOTE — Progress Notes (Signed)
Daily Session Note  Patient Details  Name: Kyle [name] MRN: [id number] Date of Birth: 1946-06-29 Referring Provider:   April Manson Pulmonary Rehab Walk Test from 04/23/2021 in [place]  Referring Provider Dr. Annamaria Boots       Encounter Date: 05/08/2021  Check In:  Session Check In - 05/08/21 1119       Check-In   Supervising physician immediately available to respond to emergencies [facility] immediately available    Physician(s) Dr. [place] Crane    Location [facility]    Staff Present Rosebud Poles, RN, Quentin Ore, MS, ACSM-CEP, Exercise Physiologist;Thelia Tanksley Ysidro Evert, RN    Virtual Visit No    Medication changes reported     No    Fall or balance concerns reported    No    Tobacco Cessation No Change    Warm-up and Cool-down Performed as group-led instruction    Resistance Training Performed Yes    VAD Patient? No    PAD/SET Patient? No      Pain Assessment   Currently in Pain? No/denies    Multiple Pain Sites No             Capillary Blood Glucose: No results found for this or any previous visit (from the past 24 hour(s)).    Social History   Tobacco Use  Smoking Status Former   Packs/day: 4.00   Years: 28.00   Pack years: 112.00   Types: Cigarettes   Quit date: 12/10/1993   Years since quitting: 27.4  Smokeless Tobacco Never    Goals Met:  Exercise tolerated well No report of concerns or symptoms today Strength training completed today  Goals Unmet:  Not Applicable  Comments: Service time is from 1018 to 1135    Dr. Fransico Him is Medical Director for Cardiac Rehab at [facility].

## 2021-05-12 ENCOUNTER — Other Ambulatory Visit: Payer: Self-pay

## 2021-05-12 ENCOUNTER — Ambulatory Visit (INDEPENDENT_AMBULATORY_CARE_PROVIDER_SITE_OTHER): Payer: Medicare HMO

## 2021-05-12 DIAGNOSIS — Z23 Encounter for immunization: Secondary | ICD-10-CM

## 2021-05-13 ENCOUNTER — Encounter (HOSPITAL_COMMUNITY)
Admission: RE | Admit: 2021-05-13 | Discharge: 2021-05-13 | Disposition: A | Discharge status: Home / Self Care | Payer: Medicare HMO | Source: Ambulatory Visit | Attending: Internal Medicine | Admitting: Internal Medicine

## 2021-05-13 DIAGNOSIS — J449 Chronic obstructive pulmonary disease, unspecified: Secondary | ICD-10-CM | POA: Diagnosis not present

## 2021-05-13 DIAGNOSIS — Z87891 Personal history of nicotine dependence: Secondary | ICD-10-CM | POA: Diagnosis not present

## 2021-05-13 NOTE — Progress Notes (Signed)
Daily Session Note  Patient Details  Name: Kyle [name] MRN: [id number] Date of Birth: 08-29-1945 Referring Provider:   April Manson [facility] Rehab Walk Test from 04/23/2021 in [place]  Referring Provider Dr. Annamaria Boots       Encounter Date: 05/13/2021  Check In:  Session Check In - 05/13/21 1119       Check-In   Supervising physician immediately available to respond to emergencies [facility] immediately available    Physician(s) Dr. [place] Crane    Location [facility]    Staff Present Rosebud Poles, RN, Quentin Ore, MS, ACSM-CEP, Exercise Physiologist;Lisa Vicente Serene, MS, ACSM CEP, Exercise Physiologist    Virtual Visit No    Medication changes reported     No    Fall or balance concerns reported    No    Tobacco Cessation No Change    Warm-up and Cool-down Performed as group-led instruction    Resistance Training Performed Yes    VAD Patient? No    PAD/SET Patient? No      Pain Assessment   Currently in Pain? No/denies    Multiple Pain Sites No             Capillary Blood Glucose: No results found for this or any previous visit (from the past 24 hour(s)).    Social History   Tobacco Use  Smoking Status Former   Packs/day: 4.00   Years: 28.00   Pack years: 112.00   Types: Cigarettes   Quit date: 12/10/1993   Years since quitting: 27.4  Smokeless Tobacco Never    Goals Met:  Proper associated with RPD/PD & O2 Sat Improved SOB with ADL's Using PLB without cueing & demonstrates good technique Exercise tolerated well No report of concerns or symptoms today Strength training completed today  Goals Unmet:  Not Applicable  Comments: Service time is from 1018 to 1135    Dr. Fransico Him is Medical Director for Cardiac Rehab at [facility].

## 2021-05-15 ENCOUNTER — Encounter (HOSPITAL_COMMUNITY)
Admission: RE | Admit: 2021-05-15 | Discharge: 2021-05-15 | Disposition: A | Discharge status: Home / Self Care | Payer: Medicare HMO | Source: Ambulatory Visit | Attending: Internal Medicine | Admitting: Internal Medicine

## 2021-05-15 ENCOUNTER — Other Ambulatory Visit: Payer: Self-pay

## 2021-05-15 DIAGNOSIS — J449 Chronic obstructive pulmonary disease, unspecified: Secondary | ICD-10-CM

## 2021-05-15 DIAGNOSIS — Z87891 Personal history of nicotine dependence: Secondary | ICD-10-CM | POA: Diagnosis not present

## 2021-05-15 NOTE — Progress Notes (Signed)
Daily Session Note  Patient Details  Name: Kyle [name] MRN: [id number] Date of Birth: [birth year] Referring Provider:   April Manson Pulmonary Rehab Walk Test from 04/23/2021 in [place]  Referring Provider Dr. Annamaria Boots       Encounter Date: 05/15/2021  Check In:  Session Check In - 05/15/21 1034       Check-In   Supervising physician immediately available to respond to emergencies [facility] immediately available    Physician(s) Dr. Verlon Au    Location [facility]    Staff Present Rosebud Poles, RN, Quentin Ore, MS, ACSM-CEP, Exercise Physiologist;Lisa Ysidro Evert, RN    Virtual Visit No    Medication changes reported     No    Fall or balance concerns reported    No    Tobacco Cessation No Change    Warm-up and Cool-down Performed as group-led instruction    Resistance Training Performed Yes    VAD Patient? No    PAD/SET Patient? No      Pain Assessment   Currently in Pain? No/denies    Multiple Pain Sites No             Capillary Blood Glucose: No results found for this or any previous visit (from the past 24 hour(s)).    Social History   Tobacco Use  Smoking Status Former   Packs/day: 4.00   Years: 28.00   Pack years: 112.00   Types: Cigarettes   Quit date: 12/10/1993   Years since quitting: 27.4  Smokeless Tobacco Never    Goals Met:  Proper associated with RPD/PD & O2 Sat Improved SOB with ADL's Exercise tolerated well No report of concerns or symptoms today Strength training completed today  Goals Unmet:  Not Applicable  Comments: Service time is from 1015 to 1140.    Dr. Fransico Him is Medical Director for Cardiac Rehab at [facility].

## 2021-05-20 ENCOUNTER — Other Ambulatory Visit: Payer: Self-pay

## 2021-05-20 ENCOUNTER — Encounter (HOSPITAL_COMMUNITY)
Admission: RE | Admit: 2021-05-20 | Discharge: 2021-05-20 | Disposition: A | Discharge status: Home / Self Care | Payer: Medicare HMO | Source: Ambulatory Visit | Attending: Internal Medicine | Admitting: Internal Medicine

## 2021-05-20 ENCOUNTER — Telehealth (HOSPITAL_COMMUNITY): Payer: Self-pay

## 2021-05-20 VITALS — Wt 237.0 lb

## 2021-05-20 DIAGNOSIS — J449 Chronic obstructive pulmonary disease, unspecified: Secondary | ICD-10-CM

## 2021-05-20 DIAGNOSIS — Z87891 Personal history of nicotine dependence: Secondary | ICD-10-CM | POA: Diagnosis not present

## 2021-05-20 NOTE — Progress Notes (Signed)
Pulmonary Individual Treatment Plan  Patient Details  Name: Kyle [name] MRN: [id number] Date of Birth: [birth year] Referring Provider:   April Manson Pulmonary Rehab Walk Test from 04/23/2021 in [facility]  Referring Provider Dr. Annamaria Boots       Initial Encounter Date:  Flowsheet Row Pulmonary Rehab Walk Test from 04/23/2021 in [facility]  Date 04/23/21       Visit Diagnosis: Stage 3 severe COPD by GOLD classification ([place])  Patient's Home Medications on Admission:   Current Outpatient Medications:    albuterol (VENTOLIN HFA) 108 (90 Base) MCG/ACT inhaler, Inhale 2 puffs into the lungs every 6 (six) hours as needed for wheezing or shortness of breath., Disp: 8 g, Rfl: 12   aspirin 81 MG tablet, Take 81 mg by mouth daily., Disp: , Rfl:    atorvastatin (LIPITOR) 40 MG tablet, TAKE ONE TABLET BY MOUTH ONE TIME DAILY AT 6 IN THE EVENING, Disp: 90 tablet, Rfl: 2   benazepril-hydrochlorthiazide (LOTENSIN HCT) 20-25 MG tablet, TAKE ONE-HALF TABLET BY MOUTH ONE TIME DAILY, Disp: 45 tablet, Rfl: 3   cetirizine (ZYRTEC) 10 MG tablet, Take 10 mg by mouth daily., Disp: , Rfl:    Cyanocobalamin (B-12 PO), Take 1 tablet by mouth daily., Disp: , Rfl:    ferrous sulfate 325 (65 FE) MG tablet, Take 1 tablet (325 mg total) by mouth daily with breakfast., Disp: 90 tablet, Rfl: 3   Fluticasone-Umeclidin-Vilant (TRELEGY ELLIPTA) 100-62.5-25 MCG/INH AEPB, Inhale 1 puff into the lungs daily., Disp: 60 each, Rfl: 12   Multiple Vitamin (MULTIVITAMIN) tablet, Take 1 tablet by mouth daily., Disp: , Rfl:    omeprazole (PRILOSEC OTC) 20 MG tablet, Take 20 mg by mouth daily as needed (heartburn). , Disp: , Rfl:    valACYclovir (VALTREX) 1000 MG tablet, 1 g., Disp: , Rfl:   Past Medical History: Past Medical History:  Diagnosis Date   Anemia    CAD (coronary artery disease) 08/03/2009   Cardiac arrhythmia    COPD (chronic obstructive pulmonary  disease) (HCC)    GERD (gastroesophageal reflux disease)    Hyperlipidemia    Hypertension    Hypothyroidism    Myocardial infarction ([place]) 08/03/2009   Sleep apnea treated with nocturnal BiPAP     Tobacco Use: Social History   Tobacco Use  Smoking Status Former   Packs/day: 4.00   Years: 28.00   Pack years: 112.00   Types: Cigarettes   Quit date: 12/10/1993   Years since quitting: 27.4  Smokeless Tobacco Never    Labs: Recent Review Flowsheet Data     Labs for ITP Cardiac and Pulmonary Rehab Latest Ref Rng & Units 09/15/2017 04/19/2018 10/31/2019 08/09/2020 11/04/2020   Cholestrol 0 - 200 mg/dL 89 96 97 103 -   LDLCALC 0 - 99 mg/dL 46 56 52 43 -   HDL >39.00 mg/dL 26.30(L) 24.60(L) 24.90(L) 23.30(L) -   Trlycerides 0.0 - 149.0 mg/dL 86.0 78.0 97.0 184.0(H) -   Hemoglobin A1c 4.6 - 6.5 % - - - 6.9(H) 7.1(H)   TCO2 0 - 100 mmol/L - - - - -       Capillary Blood Glucose: No results found for: GLUCAP   Pulmonary Assessment Scores:  Pulmonary Assessment Scores     Row Name 04/23/21 0955         ADL UCSD   ADL Phase Entry     SOB Score total 27  CAT Score   CAT Score 9           mMRC Score   mMRC Score 2             UCSD: Self-administered rating of dyspnea associated with activities of daily living (ADLs) 6-point scale (0 = "not at all" to 5 = "maximal or unable to do because of breathlessness")  Scoring Scores range from 0 to 120.  Minimally important difference is 5 units  CAT: CAT can identify the health impairment of COPD patients and is better correlated with disease progression.  CAT has a scoring range of zero to 40. The CAT score is classified into four groups of low (less than 10), medium (10 - 20), high (21-30) and very high (31-40) based on the impact level of disease on health status. A CAT score over 10 suggests significant symptoms.  A worsening CAT score could be explained by an exacerbation, poor medication adherence, poor inhaler  technique, or progression of COPD or comorbid conditions.  CAT MCID is 2 points  mMRC: mMRC (Modified Medical Research Council) Dyspnea Scale is used to assess the degree of baseline functional disability in patients of respiratory disease due to dyspnea. No minimal important difference is established. A decrease in score of 1 point or greater is considered a positive change.   Pulmonary Function Assessment:  Pulmonary Function Assessment - 04/23/21 0954       Breath   Bilateral Breath Sounds Clear    Shortness of Breath Limiting activity;Yes             Exercise Target Goals: Exercise Program Goal: Individual exercise prescription set using results from initial 6 min walk test and THRR while considering  patient's activity barriers and safety.   Exercise Prescription Goal: Initial exercise prescription builds to 30-45 minutes a day of aerobic activity, 2-3 days per week.  Home exercise guidelines will be given to patient during program as part of exercise prescription that the participant will acknowledge.  Activity Barriers & Risk Stratification:  Activity Barriers & Cardiac Risk Stratification - 04/23/21 0948       Activity Barriers & Cardiac Risk Stratification   Activity Barriers Deconditioning;Shortness of Breath;Muscular Weakness             6 Minute Walk:  6 Minute Walk     Row Name 04/23/21 1132         6 Minute Walk   Phase Initial     Distance 1135 feet     Walk Time 6 minutes     # of Rest Breaks 0     MPH 2.15     METS 2.02     RPE 7     Perceived Dyspnea  1     VO2 Peak 7.07     Symptoms No     Resting HR 71 bpm     Resting BP 130/70     Resting Oxygen Saturation  95 %     Exercise Oxygen Saturation  during 6 min walk 94 %     Max Ex. HR 97 bpm     Max Ex. BP 132/70     2 Minute Post BP 128/64           Interval HR   1 Minute HR 65     2 Minute HR 65     3 Minute HR 80     4 Minute HR 91     5 Minute HR 91  6 Minute HR 97      2 Minute Post HR 80     Interval Heart Rate? Yes           Interval Oxygen   Interval Oxygen? Yes     Baseline Oxygen Saturation % 95 %     1 Minute Oxygen Saturation % 97 %     1 Minute Liters of Oxygen 0 L     2 Minute Oxygen Saturation % 96 %     2 Minute Liters of Oxygen 0 L     3 Minute Oxygen Saturation % 94 %     3 Minute Liters of Oxygen 0 L     4 Minute Oxygen Saturation % 94 %     4 Minute Liters of Oxygen 0 L     5 Minute Oxygen Saturation % 95 %     5 Minute Liters of Oxygen 0 L     6 Minute Oxygen Saturation % 96 %     6 Minute Liters of Oxygen 0 L     2 Minute Post Oxygen Saturation % 97 %     2 Minute Post Liters of Oxygen 0 L              Oxygen Initial Assessment:  Oxygen Initial Assessment - 04/23/21 0952       Home Oxygen   Home Oxygen Device None    Sleep Oxygen Prescription BiPAP    Liters per minute 0    Home Exercise Oxygen Prescription None    Home Resting Oxygen Prescription None    Compliance with Home Oxygen Use Yes   with Bipap use     Initial 6 min Walk   Oxygen Used None      Program Oxygen Prescription   Program Oxygen Prescription None      Intervention   Short Term Goals To learn and exhibit compliance with exercise, home and travel O2 prescription;To learn and understand importance of monitoring SPO2 with pulse oximeter and demonstrate accurate use of the pulse oximeter.;To learn and understand importance of maintaining oxygen saturations>88%;To learn and demonstrate proper pursed lip breathing techniques or other breathing techniques. ;To learn and demonstrate proper use of respiratory medications    Long  Term Goals Exhibits compliance with exercise, home  and travel O2 prescription;Verbalizes importance of monitoring SPO2 with pulse oximeter and return demonstration;Maintenance of O2 saturations>88%;Exhibits proper breathing techniques, such as pursed lip breathing or other method taught during program session;Compliance with  respiratory medication;Demonstrates proper use of MDI's             Oxygen Re-Evaluation:  Oxygen Re-Evaluation     Row Name 05/06/21 0735             Program Oxygen Prescription   Program Oxygen Prescription None               Home Oxygen   Home Oxygen Device None       Sleep Oxygen Prescription BiPAP       Liters per minute 0       Home Exercise Oxygen Prescription None       Home Resting Oxygen Prescription None       Compliance with Home Oxygen Use Yes  with Bipap use               Goals/Expected Outcomes   Short Term Goals To learn and exhibit compliance with exercise, home and travel O2 prescription;To learn and understand importance of  monitoring SPO2 with pulse oximeter and demonstrate accurate use of the pulse oximeter.;To learn and understand importance of maintaining oxygen saturations>88%;To learn and demonstrate proper pursed lip breathing techniques or other breathing techniques. ;To learn and demonstrate proper use of respiratory medications       Long  Term Goals Exhibits compliance with exercise, home  and travel O2 prescription;Verbalizes importance of monitoring SPO2 with pulse oximeter and return demonstration;Maintenance of O2 saturations>88%;Exhibits proper breathing techniques, such as pursed lip breathing or other method taught during program session;Compliance with respiratory medication;Demonstrates proper use of MDI's       Goals/Expected Outcomes Compliance and understanding of oxygen saturation monitoring and importance of breathing techniques to decrease shortness of breath.                Oxygen Discharge (Final Oxygen Re-Evaluation):  Oxygen Re-Evaluation - 05/06/21 0735       Program Oxygen Prescription   Program Oxygen Prescription None      Home Oxygen   Home Oxygen Device None    Sleep Oxygen Prescription BiPAP    Liters per minute 0    Home Exercise Oxygen Prescription None    Home Resting Oxygen Prescription None     Compliance with Home Oxygen Use Yes   with Bipap use     Goals/Expected Outcomes   Short Term Goals To learn and exhibit compliance with exercise, home and travel O2 prescription;To learn and understand importance of monitoring SPO2 with pulse oximeter and demonstrate accurate use of the pulse oximeter.;To learn and understand importance of maintaining oxygen saturations>88%;To learn and demonstrate proper pursed lip breathing techniques or other breathing techniques. ;To learn and demonstrate proper use of respiratory medications    Long  Term Goals Exhibits compliance with exercise, home  and travel O2 prescription;Verbalizes importance of monitoring SPO2 with pulse oximeter and return demonstration;Maintenance of O2 saturations>88%;Exhibits proper breathing techniques, such as pursed lip breathing or other method taught during program session;Compliance with respiratory medication;Demonstrates proper use of MDI's    Goals/Expected Outcomes Compliance and understanding of oxygen saturation monitoring and importance of breathing techniques to decrease shortness of breath.             Initial Exercise Prescription:  Initial Exercise Prescription - 04/23/21 1100       Date of Initial Exercise RX and Referring Provider   Date 04/23/21    Referring Provider Dr. Annamaria Boots    Expected Discharge Date 06/24/21      NuStep   Level 2    SPM 80    Minutes 15      Track   Minutes 15      Prescription Details   Frequency (times per week) 2    Duration Progress to 30 minutes of continuous aerobic without signs/symptoms of physical distress      Intensity   THRR 40-80% of Max Heartrate 58-117    Ratings of Perceived Exertion 11-13    Perceived Dyspnea 0-4      Progression   Progression Continue to progress workloads to maintain intensity without signs/symptoms of physical distress.      Resistance Training   Training Prescription Yes    Weight Blue bands    Reps 10-15              Perform Capillary Blood Glucose checks as needed.  Exercise Prescription Changes:   Exercise Prescription Changes     Row Name 05/06/21 1200 05/20/21 1200  Response to Exercise   Blood Pressure (Admit) 132/58 132/72      Blood Pressure (Exercise) 112/60 142/70      Blood Pressure (Exit) 126/70 120/80      Heart Rate (Admit) 80 bpm 74 bpm      Heart Rate (Exercise) 102 bpm 97 bpm      Heart Rate (Exit) 80 bpm 84 bpm      Oxygen Saturation (Admit) 95 % 97 %      Oxygen Saturation (Exercise) 92 % 88 %      Oxygen Saturation (Exit) 96 % 95 %      Rating of Perceived Exertion (Exercise) 9 11      Perceived Dyspnea (Exercise) 1 1      Duration Continue with 30 min of aerobic exercise without signs/symptoms of physical distress. Continue with 30 min of aerobic exercise without signs/symptoms of physical distress.      Intensity Other (comment)  40-80% of HRR THRR unchanged             Progression   Progression Continue to progress workloads to maintain intensity without signs/symptoms of physical distress. Continue to progress workloads to maintain intensity without signs/symptoms of physical distress.             Resistance Training   Training Prescription Yes Yes      Weight Blue bands Blue bands      Reps 10-15 10-15      Time 10 Minutes 10 Minutes             NuStep   Level 2 3      SPM 80 80      Minutes 15 15      METs 1.9 2.6             Track   Laps 18 20      Minutes 15 15               Exercise Comments:   Exercise Comments     Row Name 04/29/21 1147           Exercise Comments Kyle [name] completed his 1st day of exercise. He exercised for 15 min on the track and 15 min on the Nustep at level 2 without complaints. Pete completed 16 laps on the track averaging 2.86 METS and 1.9 METs on the Nustep. He performed the warmup and cooldown standing up without limitations. Will continue to monitor and progress as able.                 Exercise Goals and Review:   Exercise Goals     Row Name 04/23/21 1137 05/06/21 0730           Exercise Goals   Increase Physical Activity Yes Yes      Intervention Provide advice, education, support and counseling about physical activity/exercise needs.;Develop an individualized exercise prescription for aerobic and resistive training based on initial evaluation findings, risk stratification, comorbidities and participant's personal goals. Provide advice, education, support and counseling about physical activity/exercise needs.;Develop an individualized exercise prescription for aerobic and resistive training based on initial evaluation findings, risk stratification, comorbidities and participant's personal goals.      Expected Outcomes Short Term: Attend rehab on a regular basis to increase amount of physical activity.;Long Term: Add in home exercise to make exercise part of routine and to increase amount of physical activity.;Long Term: Exercising regularly at least 3-5 days a week. Short Term: Attend rehab on a regular  basis to increase amount of physical activity.;Long Term: Add in home exercise to make exercise part of routine and to increase amount of physical activity.;Long Term: Exercising regularly at least 3-5 days a week.      Increase Strength and Stamina Yes Yes      Intervention Provide advice, education, support and counseling about physical activity/exercise needs.;Develop an individualized exercise prescription for aerobic and resistive training based on initial evaluation findings, risk stratification, comorbidities and participant's personal goals. Provide advice, education, support and counseling about physical activity/exercise needs.;Develop an individualized exercise prescription for aerobic and resistive training based on initial evaluation findings, risk stratification, comorbidities and participant's personal goals.      Expected Outcomes Short Term: Increase workloads  from initial exercise prescription for resistance, speed, and METs.;Short Term: Perform resistance training exercises routinely during rehab and add in resistance training at home;Long Term: Improve cardiorespiratory fitness, muscular endurance and strength as measured by increased METs and functional capacity (6MWT) Short Term: Increase workloads from initial exercise prescription for resistance, speed, and METs.;Short Term: Perform resistance training exercises routinely during rehab and add in resistance training at home;Long Term: Improve cardiorespiratory fitness, muscular endurance and strength as measured by increased METs and functional capacity (6MWT)      Able to understand and use rate of perceived exertion (RPE) scale Yes Yes      Intervention Provide education and explanation on how to use RPE scale Provide education and explanation on how to use RPE scale      Expected Outcomes Short Term: Able to use RPE daily in rehab to express subjective intensity level;Long Term:  Able to use RPE to guide intensity level when exercising independently Short Term: Able to use RPE daily in rehab to express subjective intensity level;Long Term:  Able to use RPE to guide intensity level when exercising independently      Able to understand and use Dyspnea scale Yes Yes      Intervention Provide education and explanation on how to use Dyspnea scale Provide education and explanation on how to use Dyspnea scale      Expected Outcomes Short Term: Able to use Dyspnea scale daily in rehab to express subjective sense of shortness of breath during exertion;Long Term: Able to use Dyspnea scale to guide intensity level when exercising independently Short Term: Able to use Dyspnea scale daily in rehab to express subjective sense of shortness of breath during exertion;Long Term: Able to use Dyspnea scale to guide intensity level when exercising independently      Knowledge and understanding of Target Heart Rate Range (THRR)  Yes Yes      Intervention Provide education and explanation of THRR including how the numbers were predicted and where they are located for reference Provide education and explanation of THRR including how the numbers were predicted and where they are located for reference      Expected Outcomes Short Term: Able to state/look up THRR;Short Term: Able to use daily as guideline for intensity in rehab;Long Term: Able to use THRR to govern intensity when exercising independently Short Term: Able to state/look up THRR;Short Term: Able to use daily as guideline for intensity in rehab;Long Term: Able to use THRR to govern intensity when exercising independently      Understanding of Exercise Prescription Yes Yes      Intervention Provide education, explanation, and written materials on patient's individual exercise prescription Provide education, explanation, and written materials on patient's individual exercise prescription      Expected  Outcomes Short Term: Able to explain program exercise prescription;Long Term: Able to explain home exercise prescription to exercise independently Short Term: Able to explain program exercise prescription;Long Term: Able to explain home exercise prescription to exercise independently               Exercise Goals Re-Evaluation :  Exercise Goals Re-Evaluation     Row Name 05/06/21 0730             Exercise Goal Re-Evaluation   Exercise Goals Review Increase Physical Activity;Able to understand and use Dyspnea scale;Understanding of Exercise Prescription;Increase Strength and Stamina;Knowledge and understanding of Target Heart Rate Range (THRR);Able to understand and use rate of perceived exertion (RPE) scale       Comments Kyle [name] has completed 2 exercise sessions. He tolerates exercise well and exercises for 15 min on the track and Nustep. He averages 3.09 METs on the track and 1.9 METS at level 2 on the Nustep. He performs the warmup and cooldown standing up without  limitations. It is too soon to note any significant progressions. Kyle [name] is motivtated to exercise and improve his functional capacity. Will continue to monitor and progress as able.       Expected Outcomes Through exercise at rehab and home, the pt will decrease shortness of breath with daily activities and feel confident in carrying out an exercise regimen at home.                Discharge Exercise Prescription (Final Exercise Prescription Changes):  Exercise Prescription Changes - 05/20/21 1200       Response to Exercise   Blood Pressure (Admit) 132/72    Blood Pressure (Exercise) 142/70    Blood Pressure (Exit) 120/80    Heart Rate (Admit) 74 bpm    Heart Rate (Exercise) 97 bpm    Heart Rate (Exit) 84 bpm    Oxygen Saturation (Admit) 97 %    Oxygen Saturation (Exercise) 88 %    Oxygen Saturation (Exit) 95 %    Rating of Perceived Exertion (Exercise) 11    Perceived Dyspnea (Exercise) 1    Duration Continue with 30 min of aerobic exercise without signs/symptoms of physical distress.    Intensity THRR unchanged      Progression   Progression Continue to progress workloads to maintain intensity without signs/symptoms of physical distress.      Resistance Training   Training Prescription Yes    Weight Blue bands    Reps 10-15    Time 10 Minutes      NuStep   Level 3    SPM 80    Minutes 15    METs 2.6      Track   Laps 20    Minutes 15             Nutrition:  Target Goals: Understanding of nutrition guidelines, daily intake of sodium <1559m, cholesterol <203m calories 30% from fat and 7% or less from saturated fats, daily to have 5 or more servings of fruits and vegetables.  Biometrics:  Pre Biometrics - 04/23/21 0950       Pre Biometrics   Height 5' 10.5" (1.791 m)    Weight 108.7 kg    BMI (Calculated) 33.89    Grip Strength 37 kg              Nutrition Therapy Plan and Nutrition Goals:   Nutrition Assessments:  MEDIFICTS Score  Key: ?70 Need to make dietary changes  40-70 Heart Healthy Diet ?  40 Therapeutic Level Cholesterol Diet   Picture Your Plate Scores: <25 Unhealthy dietary pattern with much room for improvement. 41-50 Dietary pattern unlikely to meet recommendations for good health and room for improvement. 51-60 More healthful dietary pattern, with some room for improvement.  >60 Healthy dietary pattern, although there may be some specific behaviors that could be improved.    Nutrition Goals Re-Evaluation:   Nutrition Goals Discharge (Final Nutrition Goals Re-Evaluation):   Psychosocial: Target Goals: Acknowledge presence or absence of significant depression and/or stress, maximize coping skills, provide positive support system. Participant is able to verbalize types and ability to use techniques and skills needed for reducing stress and depression.  Initial Review & Psychosocial Screening:  Initial Psych Review & Screening - 04/23/21 0955       Initial Review   Current issues with None Identified      Family Dynamics   Good Support System? Yes   wife     Barriers   Psychosocial barriers to participate in program There are no identifiable barriers or psychosocial needs.      Screening Interventions   Interventions Encouraged to exercise             Quality of Life Scores:  Scores of 19 and below usually indicate a poorer quality of life in these areas.  A difference of  2-3 points is a clinically meaningful difference.  A difference of 2-3 points in the total score of the Quality of Life Index has been associated with significant improvement in overall quality of life, self-image, physical symptoms, and general health in studies assessing change in quality of life.  PHQ-9: Recent Review Flowsheet Data     Depression screen [facility] 2/9 04/23/2021 04/23/2021 10/16/2020 04/19/2018 03/13/2016   Decreased Interest 0 0 0 0 0   Down, Depressed, Hopeless 0 0 0 0 0   PHQ - 2 Score 0 0 0 0 0    Altered sleeping 0 - - - -   Tired, decreased energy 0 - - - -   Change in appetite 0 - - - -   Feeling bad or failure about yourself  0 - - - -   Trouble concentrating 0 - - - -   Moving slowly or fidgety/restless 0 - - - -   Suicidal thoughts 0 - - - -   PHQ-9 Score 0 - - - -   Difficult doing work/chores Not difficult at all - - - -      Interpretation of Total Score  Total Score Depression Severity:  1-4 = Minimal depression, 5-9 = Mild depression, 10-14 = Moderate depression, 15-19 = Moderately severe depression, 20-27 = Severe depression   Psychosocial Evaluation and Intervention:  Psychosocial Evaluation - 04/23/21 0957       Psychosocial Evaluation & Interventions   Interventions Encouraged to exercise with the program and follow exercise prescription    Comments No barriers or concerns identified at this time    Expected Outcomes for pt to continue to have no barriers or concerns during pulmonary rehab    Continue Psychosocial Services  No Follow up required             Psychosocial Re-Evaluation:  Psychosocial Re-Evaluation     Row Name 05/05/21 1207             Psychosocial Re-Evaluation   Current issues with None Identified       Comments "Kyle [name]" continues with a positive attitude, no signs of depression, has  a supportive family, and no psychosocial concerns or barriers were identified at this time.       Expected Outcomes For "Kyle [name]" to continue to be free of psychosocial concerns while participating in pulmonary rehab.       Interventions Encouraged to attend Pulmonary Rehabilitation for the exercise       Continue Psychosocial Services  No Follow up required                Psychosocial Discharge (Final Psychosocial Re-Evaluation):  Psychosocial Re-Evaluation - 05/05/21 1207       Psychosocial Re-Evaluation   Current issues with None Identified    Comments "Kyle [name]" continues with a positive attitude, no signs of depression, has a supportive  family, and no psychosocial concerns or barriers were identified at this time.    Expected Outcomes For "Kyle [name]" to continue to be free of psychosocial concerns while participating in pulmonary rehab.    Interventions Encouraged to attend Pulmonary Rehabilitation for the exercise    Continue Psychosocial Services  No Follow up required             Education: Education Goals: Education classes will be provided on a weekly basis, covering required topics. Participant will state understanding/return demonstration of topics presented.  Learning Barriers/Preferences:  Learning Barriers/Preferences - 04/23/21 0959       Learning Barriers/Preferences   Learning Barriers None    Learning Preferences Computer/Internet;Group Instruction;Individual Instruction;Pictoral;Skilled Demonstration;Verbal Instruction;Video;Written Material;Audio             Education Topics: Risk Factor Reduction:  -Group instruction that is supported by a PowerPoint presentation. Instructor discusses the definition of a risk factor, different risk factors for pulmonary disease, and how the heart and lungs work together.     Nutrition for Pulmonary Patient:  -Group instruction provided by PowerPoint slides, verbal discussion, and written materials to support subject matter. The instructor gives an explanation and review of healthy diet recommendations, which includes a discussion on weight management, recommendations for fruit and vegetable consumption, as well as protein, fluid, caffeine, fiber, sodium, sugar, and alcohol. Tips for eating when patients are short of breath are discussed.   Pursed Lip Breathing:  -Group instruction that is supported by demonstration and informational handouts. Instructor discusses the benefits of pursed lip and diaphragmatic breathing and detailed demonstration on how to preform both.     Oxygen Safety:  -Group instruction provided by PowerPoint, verbal discussion, and written  material to support subject matter. There is an overview of "What is Oxygen" and "Why do we need it".  Instructor also reviews how to create a safe environment for oxygen use, the importance of using oxygen as prescribed, and the risks of noncompliance. There is a brief discussion on traveling with oxygen and resources the patient may utilize.   Oxygen Equipment:  -Group instruction provided by [facility] Staff utilizing handouts, written materials, and equipment demonstrations.   Signs and Symptoms:  -Group instruction provided by written material and verbal discussion to support subject matter. Warning signs and symptoms of infection, stroke, and heart attack are reviewed and when to call the physician/911 reinforced. Tips for preventing the spread of infection discussed.   Advanced Directives:  -Group instruction provided by verbal instruction and written material to support subject matter. Instructor reviews Advanced Directive laws and proper instruction for filling out document.   Pulmonary Video:  -Group video education that reviews the importance of medication and oxygen compliance, exercise, good nutrition, pulmonary hygiene, and pursed lip and diaphragmatic breathing  for the pulmonary patient.   Exercise for the Pulmonary Patient:  -Group instruction that is supported by a PowerPoint presentation. Instructor discusses benefits of exercise, core components of exercise, frequency, duration, and intensity of an exercise routine, importance of utilizing pulse oximetry during exercise, safety while exercising, and options of places to exercise outside of rehab.   Flowsheet Row PULMONARY REHAB CHRONIC OBSTRUCTIVE PULMONARY DISEASE from 05/15/2021 in [place]  Date 05/08/21  Educator Donnetta Simpers  [Handout]       Pulmonary Medications:  -Verbally interactive group education provided by instructor with focus on inhaled medications and proper  administration.   Anatomy and Physiology of the Respiratory System and Intimacy:  -Group instruction provided by PowerPoint, verbal discussion, and written material to support subject matter. Instructor reviews respiratory cycle and anatomical components of the respiratory system and their functions. Instructor also reviews differences in obstructive and restrictive respiratory diseases with examples of each. Intimacy, Sex, and Sexuality differences are reviewed with a discussion on how relationships can change when diagnosed with pulmonary disease. Common sexual concerns are reviewed.   MD DAY -A group question and answer session with a medical doctor that allows participants to ask questions that relate to their pulmonary disease state.   OTHER EDUCATION -Group or individual verbal, written, or video instructions that support the educational goals of the pulmonary rehab program. [place] from 05/15/2021 in [place]  Date 05/15/21  Educator handout  [[facility] [place]  Instruction Review Code 1- Verbalizes Understanding       Holiday Eating Survival Tips:  -Group instruction provided by PowerPoint slides, verbal discussion, and written materials to support subject matter. The instructor gives patients tips, tricks, and techniques to help them not only survive but enjoy the holidays despite the onslaught of food that accompanies the holidays.   Knowledge Questionnaire Score:  Knowledge Questionnaire Score - 04/23/21 1016       Knowledge Questionnaire Score   Pre Score 13/18             Core Components/Risk Factors/Patient Goals at Admission:  Personal Goals and Risk Factors at Admission - 04/23/21 1000       Core Components/Risk Factors/Patient Goals on Admission   Improve shortness of breath with ADL's Yes    Intervention Provide education, individualized exercise plan and daily  activity instruction to help decrease symptoms of SOB with activities of daily living.    Expected Outcomes Short Term: Improve cardiorespiratory fitness to achieve a reduction of symptoms when performing ADLs;Long Term: Be able to perform more ADLs without symptoms or delay the onset of symptoms             Core Components/Risk Factors/Patient Goals Review:   Goals and Risk Factor Review     Row Name 04/23/21 1002 05/05/21 1209           Core Components/Risk Factors/Patient Goals Review   Personal Goals Review Increase knowledge of respiratory medications and ability to use respiratory devices properly.;Improve shortness of breath with ADL's;Develop more efficient breathing techniques such as purse lipped breathing and diaphragmatic breathing and practicing self-pacing with activity. Increase knowledge of respiratory medications and ability to use respiratory devices properly.;Improve shortness of breath with ADL's;Develop more efficient breathing techniques such as purse lipped breathing and diaphragmatic breathing and practicing self-pacing with activity.      Review -- "Kyle [name]" has attended 2 exercise sessions in pulmonary rehab.  We are working  with him on puse-lip breathing and diaphragmatic breathing.  It is too early to have met any program or personal goals.      Expected Outcomes -- See admission goals.               Core Components/Risk Factors/Patient Goals at Discharge (Final Review):   Goals and Risk Factor Review - 05/05/21 1209       Core Components/Risk Factors/Patient Goals Review   Personal Goals Review Increase knowledge of respiratory medications and ability to use respiratory devices properly.;Improve shortness of breath with ADL's;Develop more efficient breathing techniques such as purse lipped breathing and diaphragmatic breathing and practicing self-pacing with activity.    Review "Kyle [name]" has attended 2 exercise sessions in pulmonary rehab.  We are working with  him on puse-lip breathing and diaphragmatic breathing.  It is too early to have met any program or personal goals.    Expected Outcomes See admission goals.             ITP Comments:   Comments: ITP REVIEW Pt is making expected progress toward Pulmonary Rehab goals after completing 7 sessions. Recommend continued exercise, life style modification, education, and utilization of breathing techniques to increase stamina and strength, while also decreasing shortness of breath with exertion.

## 2021-05-20 NOTE — Progress Notes (Signed)
Daily Session Note  Patient Details  Name: Kyle [name] MRN: [id number] Date of Birth: [birth year] Referring Provider:   April Manson Pulmonary Rehab Walk Test from 04/23/2021 in [place]  Referring Provider Dr. Annamaria Boots       Encounter Date: 05/20/2021  Check In:  Session Check In - 05/20/21 1118       Check-In   Supervising physician immediately available to respond to emergencies [facility] immediately available    Physician(s) Dr. Verlon Au    Location [facility]    Staff Present Rosebud Poles, RN, Quentin Ore, MS, ACSM-CEP, Exercise Physiologist;Lisa Ysidro Evert, RN    Virtual Visit No    Medication changes reported     No    Fall or balance concerns reported    No    Tobacco Cessation No Change    Warm-up and Cool-down Performed as group-led instruction    Resistance Training Performed Yes    VAD Patient? No    PAD/SET Patient? No      Pain Assessment   Currently in Pain? No/denies    Multiple Pain Sites No             Capillary Blood Glucose: No results found for this or any previous visit (from the past 24 hour(s)).   Exercise Prescription Changes - 05/20/21 1200       Response to Exercise   Blood Pressure (Admit) 132/72    Blood Pressure (Exercise) 142/70    Blood Pressure (Exit) 120/80    Heart Rate (Admit) 74 bpm    Heart Rate (Exercise) 97 bpm    Heart Rate (Exit) 84 bpm    Oxygen Saturation (Admit) 97 %    Oxygen Saturation (Exercise) 88 %    Oxygen Saturation (Exit) 95 %    Rating of Perceived Exertion (Exercise) 11    Perceived Dyspnea (Exercise) 1    Duration Continue with 30 min of aerobic exercise without signs/symptoms of physical distress.    Intensity THRR unchanged      Progression   Progression Continue to progress workloads to maintain intensity without signs/symptoms of physical distress.      Resistance Training   Training Prescription Yes    Weight Blue bands    Reps  10-15    Time 10 Minutes      NuStep   Level 3    SPM 80    Minutes 15    METs 2.6      Track   Laps 20    Minutes 15             Social History   Tobacco Use  Smoking Status Former   Packs/day: 4.00   Years: 28.00   Pack years: 112.00   Types: Cigarettes   Quit date: 12/10/1993   Years since quitting: 27.4  Smokeless Tobacco Never    Goals Met:  Proper associated with RPD/PD & O2 Sat Exercise tolerated well No report of concerns or symptoms today Strength training completed today  Goals Unmet:  Not Applicable  Comments: Service time is from 1019 to 1120.    Dr. Fransico Him is Medical Director for Cardiac Rehab at [facility].

## 2021-05-22 ENCOUNTER — Other Ambulatory Visit: Payer: Self-pay

## 2021-05-22 ENCOUNTER — Encounter (HOSPITAL_COMMUNITY)
Admission: RE | Admit: 2021-05-22 | Discharge: 2021-05-22 | Disposition: A | Discharge status: Home / Self Care | Payer: Medicare HMO | Source: Ambulatory Visit | Attending: Internal Medicine | Admitting: Internal Medicine

## 2021-05-22 DIAGNOSIS — Z87891 Personal history of nicotine dependence: Secondary | ICD-10-CM | POA: Diagnosis not present

## 2021-05-22 DIAGNOSIS — J449 Chronic obstructive pulmonary disease, unspecified: Secondary | ICD-10-CM

## 2021-05-22 NOTE — Progress Notes (Signed)
Daily Session Note  Patient Details  Name: Kyle [name] MRN: [id number] Date of Birth: [birth year] Referring Provider:   April Manson Pulmonary Rehab Walk Test from 04/23/2021 in [place]  Referring Provider Dr. Annamaria Boots       Encounter Date: 05/22/2021  Check In:  Session Check In - 05/22/21 1139       Check-In   Supervising physician immediately available to respond to emergencies [facility] immediately available    Physician(s) Dr. Nevada Crane    Location [facility]    Staff Present Rosebud Poles, RN, BSN;Ferron Ishmael Ysidro Evert, Cathleen Fears, MS, ACSM-CEP, Exercise Physiologist    Virtual Visit No    Medication changes reported     No    Fall or balance concerns reported    No    Tobacco Cessation No Change    Warm-up and Cool-down Performed as group-led instruction    Resistance Training Performed Yes    VAD Patient? No    PAD/SET Patient? No      Pain Assessment   Currently in Pain? No/denies    Multiple Pain Sites No             Capillary Blood Glucose: No results found for this or any previous visit (from the past 24 hour(s)).    Social History   Tobacco Use  Smoking Status Former   Packs/day: 4.00   Years: 28.00   Pack years: 112.00   Types: Cigarettes   Quit date: 12/10/1993   Years since quitting: 27.4  Smokeless Tobacco Never    Goals Met:  Exercise tolerated well No report of concerns or symptoms today Strength training completed today  Goals Unmet: Not Applicable  Comments: Service time is from 1024 to 1128    Dr. Rodman Pickle is  Pulmonary Rehab Medical director at [facility].

## 2021-05-23 NOTE — Progress Notes (Signed)
Subjective:    Patient ID: Kyle [name], male    DOB: 08-Dec-1945, 75 y.o.   MRN: [id number]  HPI   male former smoker followed for OSA, COPD GOLD III, Rhinitis Complicated by GERD, CAD/MI, HBP Unattended Home Sleep Test-01/05/2014, moderate OSA, AHI 19/hour, weight 223 pounds BIPAP titration 03/18/18- Office Spirometry 09/10/2016-severe obstructive airways disease: FVC 2.83/61%, FEV1 1.17/34%, ratio 0.41, FEF 25-75% 0.35/13% PFT 09/22/16-severe COPD, no response to dilator, diffusion moderately reduced, air trapping, hyperinflation. FVC 3.54/75%, FEV1 1.7 2/49%, ratio 0.49, TLC 123%, DLCO 56% O2 Qualifying Walk Test 01/17/21- 3 laps x 250 ft, lowest sat 94%, max HR 83. Nurse reports "good steady pace, able to talk as well". -----------------------------------------------------------------  01/17/21- 75 year old male former smoker followed for OSA, COPD, rhinitis complicated by GERD, CAD/MI, HBP, Covid infection/ Paxlovid May 2022,  BIPAP 15/11/ PS 3 ([employer])/ [employer] Covid vax- 3 [employer] Body weight today-239 lbs Download-  Meds include prednisone taper started 6/17, Trelegy 100, Ventolin HFA,  Wife asked he be seen today to evaluate breathing, reporting his Cardiologist suggested Pulmonary Rehab. Gradually aware of more limiting DOE. Not abruptly worse after Covid last month. DOE limits golf to 9 holes max, short walks esp if carrying anything. Some cough productive white, no wheeze. V[facility] noted AAA on abd ultrasound. O2 Qualifying Walk Test 01/17/21- 3 laps x 250 ft, lowest sat 94%, max HR 83. Nurse reports "good steady pace, able to talk as well".  05/26/21- 75 year old male former smoker followed for OSA, COPD, rhinitis complicated by GERD, CAD/MI, HBP, Covid infection/ Paxlovid May 2022,  BIPAP 15/11/ PS 3 [employer] -Trelegy 100, albuterol hfa Covid vax- 4[employer] Body weight today-239 lbs Download- compliance 100%, AHI 4.9/ hr Flu vax- had Doing Pulmonary Rehab for  COPD-feels that is some help.  Anticipates return to [employer]. He is comfortable with CPAP.  Download reviewed. CTa chest aorta 01/29/21- Lungs/Pleura: Paraseptal and centrilobular emphysema. No confluent airspace disease. Mild reticular opacities in the dependent aspects of the lungs compatible with atelectasis. Mild linear scarring/architectural distortion of the right middle lobe, bilateral lower lobes.  No suspicious nodules. No pneumothorax or pleural effusion   ROS-see HPI   + = positive Constitutional:    weight loss, night sweats, fevers, chills, fatigue, lassitude. HEENT:    headaches, difficulty swallowing, tooth/dental problems, sore throat,       sneezing, itching, ear ache, nasal congestion, post nasal drip, snoring CV:    chest pain, orthopnea, PND, swelling in lower extremities, anasarca,                                                           dizziness, palpitations Resp:   +shortness of breath with exertion or at rest.                +productive cough,   non-productive cough, coughing up of blood.              change in color of mucus.  +wheezing.   Skin:    rash or lesions. GI:  No-   heartburn, indigestion, abdominal pain, nausea, vomiting, diarrhea,                 change in bowel habits, loss of appetite GU: dysuria, change in color of urine, no urgency or  frequency.   flank pain. MS:   joint pain, stiffness, decreased range of motion, back pain. Neuro-     nothing unusual Psych:  change in mood or affect.  depression or anxiety.   memory loss.     Objective:  OBJ- Physical Exam General- Alert, Oriented, Affect-appropriate, Distress- none acute, + overweight Skin- rash-none, lesions- none, excoriation- none Lymphadenopathy- none Head- atraumatic            Eyes- Gross vision intact, PERRLA, conjunctivae and secretions clear            Ears- Hearing, canals-normal            Nose- Clear, no-Septal dev, mucus, polyps, erosion, perforation,               Throat- Mallampati III , mucosa clear , drainage- none, tonsils- atrophic Neck- flexible , trachea midline, no stridor , thyroid nl, carotid no bruit Chest - symmetrical excursion , unlabored           Heart/CV- RRR , no murmur , no gallop  , no rub, nl s1 s2                           - JVD- none , edema- none, stasis changes- none, varices- none           Lung- + unlabored, +Clear/ diminished, wheeze- none, cough- none , dullness-none, rub- none           Chest wall-  Abd-  Br/ Gen/ Rectal- Not done, not indicated Extrem- cyanosis- none, clubbing, none, atrophy- none, strength- nl Neuro- grossly intact to observation  Assessment & Plan:

## 2021-05-26 ENCOUNTER — Ambulatory Visit: Payer: Medicare HMO | Admitting: Internal Medicine

## 2021-05-26 ENCOUNTER — Encounter: Payer: Self-pay | Admitting: Internal Medicine

## 2021-05-26 ENCOUNTER — Other Ambulatory Visit: Payer: Self-pay

## 2021-05-26 DIAGNOSIS — J449 Chronic obstructive pulmonary disease, unspecified: Secondary | ICD-10-CM | POA: Diagnosis not present

## 2021-05-26 DIAGNOSIS — G4733 Obstructive sleep apnea (adult) (pediatric): Secondary | ICD-10-CM

## 2021-05-26 MED ORDER — ALBUTEROL SULFATE HFA 108 (90 BASE) MCG/ACT IN AERS: 2.0000 | INHALATION_SPRAY | Freq: Four times a day (QID) | RESPIRATORY_TRACT | 12 refills | Status: DC | PRN

## 2021-05-26 NOTE — Patient Instructions (Signed)
We can continue BIPAP 15/11  Ventolin albuterol rescue inhaler refilled  Please call if we can help

## 2021-05-27 ENCOUNTER — Encounter (HOSPITAL_COMMUNITY)
Admission: RE | Admit: 2021-05-27 | Discharge: 2021-05-27 | Disposition: A | Discharge status: Home / Self Care | Payer: Medicare HMO | Source: Ambulatory Visit | Attending: Internal Medicine | Admitting: Internal Medicine

## 2021-05-27 DIAGNOSIS — Z87891 Personal history of nicotine dependence: Secondary | ICD-10-CM | POA: Diagnosis not present

## 2021-05-27 DIAGNOSIS — J449 Chronic obstructive pulmonary disease, unspecified: Secondary | ICD-10-CM

## 2021-05-27 NOTE — Progress Notes (Signed)
Daily Session Note  Patient Details  Name: Kyle [name] MRN: [id number] Date of Birth: 07/06/1946 Referring Provider:   Flowsheet Row Pulmonary Rehab Walk Test from 04/23/2021 in [facility]  Referring Provider Dr. Young       Encounter Date: 05/27/2021  Check In:  Session Check In - 05/27/21 1114       Check-In   Supervising physician immediately available to respond to emergencies [facility] immediately available    Physician(s) Dr. Akula    [facility]    Staff Present  , RN, BSN;Kaylee Davis, MS, ACSM-CEP, Exercise Physiologist;Lisa Hughes, RN    Virtual Visit No    Medication changes reported     No    Fall or balance concerns reported    No    Tobacco Cessation No Change    Warm-up and Cool-down Performed as group-led instruction    Resistance Training Performed Yes    VAD Patient? No    PAD/SET Patient? No      Pain Assessment   Currently in Pain? No/denies    Multiple Pain Sites No             Capillary Blood Glucose: No results found for this or any previous visit (from the past 24 hour(s)).    Social History   Tobacco Use  Smoking Status Former   Packs/day: 4.00   Years: 28.00   Pack years: 112.00   Types: Cigarettes   Quit date: 12/10/1993   Years since quitting: 27.4  Smokeless Tobacco Never    Goals Met:  Proper associated with RPD/PD & O2 Sat Independence with exercise equipment Improved SOB with ADL's Exercise tolerated well Strength training completed today  Goals Unmet:  Not Applicable  Comments: Service time is from 1023 to 1127.    Dr. Jane Ellison is Medical Director for Pulmonary Rehab at  Hospital.  

## 2021-05-29 ENCOUNTER — Encounter (HOSPITAL_COMMUNITY)
Admission: RE | Admit: 2021-05-29 | Discharge: 2021-05-29 | Disposition: A | Discharge status: Home / Self Care | Payer: Medicare HMO | Source: Ambulatory Visit | Attending: Internal Medicine | Admitting: Internal Medicine

## 2021-05-29 ENCOUNTER — Other Ambulatory Visit: Payer: Self-pay

## 2021-05-29 DIAGNOSIS — J449 Chronic obstructive pulmonary disease, unspecified: Secondary | ICD-10-CM | POA: Diagnosis not present

## 2021-05-29 DIAGNOSIS — Z87891 Personal history of nicotine dependence: Secondary | ICD-10-CM | POA: Diagnosis not present

## 2021-05-29 NOTE — Progress Notes (Signed)
Daily Session Note  Patient Details  Name: Kyle [name] MRN: [id number] Date of Birth: 1945-11-07 Referring Provider:   April Manson Pulmonary Rehab Walk Test from 04/23/2021 in [place]  Referring Provider Dr. Annamaria Boots       Encounter Date: 05/29/2021  Check In:  Session Check In - 05/29/21 1105       Check-In   Supervising physician immediately available to respond to emergencies [facility] immediately available    Physician(s) Dr. Verlon Au    [facility]    Staff Present Rosebud Poles, RN, Quentin Ore, MS, ACSM-CEP, Exercise Physiologist;Lisa Ysidro Evert, RN    Virtual Visit No    Medication changes reported     No    Fall or balance concerns reported    No    Tobacco Cessation No Change    Warm-up and Cool-down Performed as group-led instruction    Resistance Training Performed Yes    VAD Patient? No    PAD/SET Patient? No      Pain Assessment   Currently in Pain? No/denies    Multiple Pain Sites No             Capillary Blood Glucose: No results found for this or any previous visit (from the past 24 hour(s)).    Social History   Tobacco Use  Smoking Status Former   Packs/day: 4.00   Years: 28.00   Pack years: 112.00   Types: Cigarettes   Quit date: 12/10/1993   Years since quitting: 27.4  Smokeless Tobacco Never    Goals Met:  Proper associated with RPD/PD & O2 Sat Independence with exercise equipment Using PLB without cueing & demonstrates good technique Exercise tolerated well No report of concerns or symptoms today Strength training completed today  Goals Unmet:  Not Applicable  Comments: Service time is from 1022 to 1130    Dr. Rodman Pickle is Medical Director for Pulmonary Rehab at [facility].

## 2021-06-03 ENCOUNTER — Other Ambulatory Visit: Payer: Self-pay

## 2021-06-03 ENCOUNTER — Encounter (HOSPITAL_COMMUNITY)
Admission: RE | Admit: 2021-06-03 | Discharge: 2021-06-03 | Disposition: A | Discharge status: Home / Self Care | Payer: Medicare HMO | Source: Ambulatory Visit | Attending: Internal Medicine | Admitting: Internal Medicine

## 2021-06-03 DIAGNOSIS — J449 Chronic obstructive pulmonary disease, unspecified: Secondary | ICD-10-CM | POA: Diagnosis not present

## 2021-06-03 NOTE — Progress Notes (Signed)
Daily Session Note  Patient Details  Name: Kyle [name] MRN: [id number] Date of Birth: 03/15/1946 Referring Provider:   Flowsheet Row Pulmonary Rehab Walk Test from 04/23/2021 in [facility]  Referring Provider Dr. Young       Encounter Date: 06/03/2021  Check In:  Session Check In - 06/03/21 1058       Check-In   Supervising physician immediately available to respond to emergencies [facility] immediately available    Physician(s) Dr. Kumar    [facility]    Staff Present Joan Behrens, RN, BSN; , MS, ACSM-CEP, Exercise Physiologist;Lisa Hughes, RN    Virtual Visit No    Medication changes reported     No    Fall or balance concerns reported    No    Tobacco Cessation No Change    Warm-up and Cool-down Performed as group-led instruction    Resistance Training Performed Yes    VAD Patient? No    PAD/SET Patient? No      Pain Assessment   Currently in Pain? No/denies    Multiple Pain Sites No             Capillary Blood Glucose: No results found for this or any previous visit (from the past 24 hour(s)).   Exercise Prescription Changes - 06/03/21 1200       Response to Exercise   Blood Pressure (Admit) 124/66    Blood Pressure (Exercise) 136/62    Blood Pressure (Exit) 124/64    Heart Rate (Admit) 79 bpm    Heart Rate (Exercise) 99 bpm    Heart Rate (Exit) 82 bpm    Oxygen Saturation (Admit) 95 %    Oxygen Saturation (Exercise) 93 %    Oxygen Saturation (Exit) 93 %    Rating of Perceived Exertion (Exercise) 9    Perceived Dyspnea (Exercise) 1    Duration Continue with 30 min of aerobic exercise without signs/symptoms of physical distress.    Intensity THRR unchanged      Progression   Progression Continue to progress workloads to maintain intensity without signs/symptoms of physical distress.      Resistance Training   Training Prescription Yes    Weight Blue bands    Reps 10-15     Time 10 Minutes      NuStep   Level 5    SPM 90    Minutes 15    METs 2.6      Track   Laps 22    Minutes 15    METs 3.32   over            Social History   Tobacco Use  Smoking Status Former   Packs/day: 4.00   Years: 28.00   Pack years: 112.00   Types: Cigarettes   Quit date: 12/10/1993   Years since quitting: 27.4  Smokeless Tobacco Never    Goals Met:  Proper associated with RPD/PD & O2 Sat Independence with exercise equipment Exercise tolerated well No report of concerns or symptoms today Strength training completed today  Goals Unmet:  Not Applicable  Comments: Service time is from 1015 to 1125.    Dr. Jane Ellison is Medical Director for Pulmonary Rehab at [facility].  

## 2021-06-05 ENCOUNTER — Other Ambulatory Visit: Payer: Self-pay

## 2021-06-05 ENCOUNTER — Encounter (HOSPITAL_COMMUNITY)
Admission: RE | Admit: 2021-06-05 | Discharge: 2021-06-05 | Disposition: A | Discharge status: Home / Self Care | Payer: Medicare HMO | Source: Ambulatory Visit | Attending: Internal Medicine | Admitting: Internal Medicine

## 2021-06-05 DIAGNOSIS — J449 Chronic obstructive pulmonary disease, unspecified: Secondary | ICD-10-CM

## 2021-06-05 NOTE — Progress Notes (Signed)
Daily Session Note  Patient Details  Name: Kyle [name] MRN: [id number] Date of Birth: Feb 22, 1946 Referring Provider:   April Manson Pulmonary Rehab Walk Test from 04/23/2021 in [place]  Referring Provider Dr. Annamaria Boots       Encounter Date: 06/05/2021  Check In:  Session Check In - 06/05/21 1130       Check-In   Supervising physician immediately available to respond to emergencies [facility] immediately available    Physician(s) Dr. Frederic Jericho    Location [facility]    Staff Present Rosebud Poles, RN, Quentin Ore, MS, ACSM-CEP, Exercise Physiologist    Virtual Visit No    Medication changes reported     No    Fall or balance concerns reported    No    Tobacco Cessation No Change    Warm-up and Cool-down Performed as group-led instruction    Resistance Training Performed Yes    VAD Patient? No    PAD/SET Patient? No      Pain Assessment   Currently in Pain? No/denies    Multiple Pain Sites No             Capillary Blood Glucose: No results found for this or any previous visit (from the past 24 hour(s)).    Social History   Tobacco Use  Smoking Status Former   Packs/day: 4.00   Years: 28.00   Pack years: 112.00   Types: Cigarettes   Quit date: 12/10/1993   Years since quitting: 27.5  Smokeless Tobacco Never    Goals Met:  Proper associated with RPD/PD & O2 Sat Independence with exercise equipment Improved SOB with ADL's Using PLB without cueing & demonstrates good technique Exercise tolerated well Personal goals reviewed No report of concerns or symptoms today Strength training completed today  Goals Unmet:  Not Applicable  Comments: Service time is from 1015 to 1115    Dr. Rodman Pickle is Medical Director for Pulmonary Rehab at [facility].

## 2021-06-05 NOTE — Progress Notes (Signed)
Discharge Progress Report  Patient Details  Name: Kyle [name] MRN: [id number] Date of Birth: April 04, 1946 Referring Provider:   April Manson Pulmonary Rehab Walk Test from 04/23/2021 in [place]  Referring Provider Dr. Annamaria Boots        Number of Visits: 12  Reason for Discharge:  Patient reached a stable level of exercise. Patient independent in their exercise. Patient has met program and personal goals.  Smoking History:  Social History   Tobacco Use  Smoking Status Former   Packs/day: 4.00   Years: 28.00   Pack years: 112.00   Types: Cigarettes   Quit date: 12/10/1993   Years since quitting: 27.5  Smokeless Tobacco Never    Diagnosis:  Stage 3 severe COPD by GOLD classification ([place])  ADL [facility]SD:  Pulmonary Assessment Scores     Row Name 04/23/21 0955 06/05/21 1448       ADL UCSD   ADL Phase Entry Exit    SOB Score total 27 8      CAT Score   CAT Score 9 4      mMRC Score   mMRC Score 2 1             Initial Exercise Prescription:  Initial Exercise Prescription - 04/23/21 1100       Date of Initial Exercise RX and Referring Provider   Date 04/23/21    Referring Provider Dr. Annamaria Boots    Expected Discharge Date 06/24/21      NuStep   Level 2    SPM 80    Minutes 15      Track   Minutes 15      Prescription Details   Frequency (times per week) 2    Duration Progress to 30 minutes of continuous aerobic without signs/symptoms of physical distress      Intensity   THRR 40-80% of Max Heartrate 58-117    Ratings of Perceived Exertion 11-13    Perceived Dyspnea 0-4      Progression   Progression Continue to progress workloads to maintain intensity without signs/symptoms of physical distress.      Resistance Training   Training Prescription Yes    Weight Blue bands    Reps 10-15             Discharge Exercise Prescription (Final Exercise Prescription Changes):  Exercise Prescription Changes - 06/03/21  1200       Response to Exercise   Blood Pressure (Admit) 124/66    Blood Pressure (Exercise) 136/62    Blood Pressure (Exit) 124/64    Heart Rate (Admit) 79 bpm    Heart Rate (Exercise) 99 bpm    Heart Rate (Exit) 82 bpm    Oxygen Saturation (Admit) 95 %    Oxygen Saturation (Exercise) 93 %    Oxygen Saturation (Exit) 93 %    Rating of Perceived Exertion (Exercise) 9    Perceived Dyspnea (Exercise) 1    Duration Continue with 30 min of aerobic exercise without signs/symptoms of physical distress.    Intensity THRR unchanged      Progression   Progression Continue to progress workloads to maintain intensity without signs/symptoms of physical distress.      Resistance Training   Training Prescription Yes    Weight Blue bands    Reps 10-15    Time 10 Minutes      NuStep   Level 5    SPM 90    Minutes 15  METs 2.6      Track   Laps 22    Minutes 15    METs 3.32   over            Functional Capacity:  [place] Name 04/23/21 1132 06/05/21 1212       6 Minute Walk   Phase Initial Discharge    Distance 1135 feet 1515 feet    Distance % Change -- 33.48 %    Distance Feet Change -- 380 ft    Walk Time 6 minutes 6 minutes    # of Rest Breaks 0 0    MPH 2.15 2.87    METS 2.02 2.92    RPE 7 9    Perceived Dyspnea  1 1    VO2 Peak 7.07 10.2    Symptoms No No    Resting HR 71 bpm 73 bpm    Resting BP 130/70 144/64    Resting Oxygen Saturation  95 % 96 %    Exercise Oxygen Saturation  during 6 min walk 94 % 92 %    Max Ex. HR 97 bpm 102 bpm    Max Ex. BP 132/70 150/70    2 Minute Post BP 128/64 138/66      Interval HR   1 Minute HR 65 102    2 Minute HR 65 94    3 Minute HR 80 97    4 Minute HR 91 97    5 Minute HR 91 97    6 Minute HR 97 102    2 Minute Post HR 80 84    Interval Heart Rate? Yes Yes      Interval Oxygen   Interval Oxygen? Yes Yes    Baseline Oxygen Saturation % 95 % 96 %    1 Minute Oxygen Saturation % 97 % 92 %    1  Minute Liters of Oxygen 0 L 0 L    2 Minute Oxygen Saturation % 96 % 94 %    2 Minute Liters of Oxygen 0 L 0 L    3 Minute Oxygen Saturation % 94 % 94 %    3 Minute Liters of Oxygen 0 L 0 L    4 Minute Oxygen Saturation % 94 % 94 %    4 Minute Liters of Oxygen 0 L 0 L    5 Minute Oxygen Saturation % 95 % 97 %    5 Minute Liters of Oxygen 0 L 0 L    6 Minute Oxygen Saturation % 96 % 93 %    6 Minute Liters of Oxygen 0 L 0 L    2 Minute Post Oxygen Saturation % 97 % 96 %    2 Minute Post Liters of Oxygen 0 L 0 L             Psychological, QOL, Others - Outcomes: PHQ 2/9: Depression screen [facility] 2/9 04/23/2021 04/23/2021 10/16/2020 04/19/2018 03/13/2016  Decreased Interest 0 0 0 0 0  Down, Depressed, Hopeless 0 0 0 0 0  PHQ - 2 Score 0 0 0 0 0  Altered sleeping 0 - - - -  Tired, decreased energy 0 - - - -  Change in appetite 0 - - - -  Feeling bad or failure about yourself  0 - - - -  Trouble concentrating 0 - - - -  Moving slowly or fidgety/restless 0 - - - -  Suicidal thoughts 0 - - - -  PHQ-9 Score 0 - - - -  Difficult doing work/chores Not difficult at all - - - -    Quality of Life:   Personal Goals: Goals established at orientation with interventions provided to work toward goal.  Personal Goals and Risk Factors at Admission - 04/23/21 1000       Core Components/Risk Factors/Patient Goals on Admission   Improve shortness of breath with ADL's Yes    Intervention Provide education, individualized exercise plan and daily activity instruction to help decrease symptoms of SOB with activities of daily living.    Expected Outcomes Short Term: Improve cardiorespiratory fitness to achieve a reduction of symptoms when performing ADLs;Long Term: Be able to perform more ADLs without symptoms or delay the onset of symptoms              Personal Goals Discharge:  Goals and Risk Factor Review     Row Name 04/23/21 1002 05/05/21 1209 06/03/21 1419         Core  Components/Risk Factors/Patient Goals Review   Personal Goals Review Increase knowledge of respiratory medications and ability to use respiratory devices properly.;Improve shortness of breath with ADL's;Develop more efficient breathing techniques such as purse lipped breathing and diaphragmatic breathing and practicing self-pacing with activity. Increase knowledge of respiratory medications and ability to use respiratory devices properly.;Improve shortness of breath with ADL's;Develop more efficient breathing techniques such as purse lipped breathing and diaphragmatic breathing and practicing self-pacing with activity. Increase knowledge of respiratory medications and ability to use respiratory devices properly.;Improve shortness of breath with ADL's;Develop more efficient breathing techniques such as purse lipped breathing and diaphragmatic breathing and practicing self-pacing with activity.     Review -- "Laurey Arrow" has attended 2 exercise sessions in pulmonary rehab.  We are working with him on puse-lip breathing and diaphragmatic breathing.  It is too early to have met any program or personal goals. Pete graduates 06/05/2021 and has progressed well.  He is walking almost 1 mile in 15 minutes and 2.6 mets on the nustep. He has gained strength and stamina.     Expected Outcomes -- See admission goals. See admission goals.              Exercise Goals and Review:  Exercise Goals     Row Name 04/23/21 1137 05/06/21 0730 06/02/21 0921         Exercise Goals   Increase Physical Activity Yes Yes Yes     Intervention Provide advice, education, support and counseling about physical activity/exercise needs.;Develop an individualized exercise prescription for aerobic and resistive training based on initial evaluation findings, risk stratification, comorbidities and participant's personal goals. Provide advice, education, support and counseling about physical activity/exercise needs.;Develop an individualized  exercise prescription for aerobic and resistive training based on initial evaluation findings, risk stratification, comorbidities and participant's personal goals. Provide advice, education, support and counseling about physical activity/exercise needs.;Develop an individualized exercise prescription for aerobic and resistive training based on initial evaluation findings, risk stratification, comorbidities and participant's personal goals.     Expected Outcomes Short Term: Attend rehab on a regular basis to increase amount of physical activity.;Long Term: Add in home exercise to make exercise part of routine and to increase amount of physical activity.;Long Term: Exercising regularly at least 3-5 days a week. Short Term: Attend rehab on a regular basis to increase amount of physical activity.;Long Term: Add in home exercise to make exercise part of routine and to increase amount of physical activity.;Long Term: Exercising regularly at least  3-5 days a week. Short Term: Attend rehab on a regular basis to increase amount of physical activity.;Long Term: Add in home exercise to make exercise part of routine and to increase amount of physical activity.;Long Term: Exercising regularly at least 3-5 days a week.     Increase Strength and Stamina Yes Yes Yes     Intervention Provide advice, education, support and counseling about physical activity/exercise needs.;Develop an individualized exercise prescription for aerobic and resistive training based on initial evaluation findings, risk stratification, comorbidities and participant's personal goals. Provide advice, education, support and counseling about physical activity/exercise needs.;Develop an individualized exercise prescription for aerobic and resistive training based on initial evaluation findings, risk stratification, comorbidities and participant's personal goals. Provide advice, education, support and counseling about physical activity/exercise needs.;Develop  an individualized exercise prescription for aerobic and resistive training based on initial evaluation findings, risk stratification, comorbidities and participant's personal goals.     Expected Outcomes Short Term: Increase workloads from initial exercise prescription for resistance, speed, and METs.;Short Term: Perform resistance training exercises routinely during rehab and add in resistance training at home;Long Term: Improve cardiorespiratory fitness, muscular endurance and strength as measured by increased METs and functional capacity (6MWT) Short Term: Increase workloads from initial exercise prescription for resistance, speed, and METs.;Short Term: Perform resistance training exercises routinely during rehab and add in resistance training at home;Long Term: Improve cardiorespiratory fitness, muscular endurance and strength as measured by increased METs and functional capacity (6MWT) Short Term: Increase workloads from initial exercise prescription for resistance, speed, and METs.;Short Term: Perform resistance training exercises routinely during rehab and add in resistance training at home;Long Term: Improve cardiorespiratory fitness, muscular endurance and strength as measured by increased METs and functional capacity (6MWT)     Able to understand and use rate of perceived exertion (RPE) scale Yes Yes Yes     Intervention Provide education and explanation on how to use RPE scale Provide education and explanation on how to use RPE scale Provide education and explanation on how to use RPE scale     Expected Outcomes Short Term: Able to use RPE daily in rehab to express subjective intensity level;Long Term:  Able to use RPE to guide intensity level when exercising independently Short Term: Able to use RPE daily in rehab to express subjective intensity level;Long Term:  Able to use RPE to guide intensity level when exercising independently Short Term: Able to use RPE daily in rehab to express subjective  intensity level;Long Term:  Able to use RPE to guide intensity level when exercising independently     Able to understand and use Dyspnea scale Yes Yes Yes     Intervention Provide education and explanation on how to use Dyspnea scale Provide education and explanation on how to use Dyspnea scale Provide education and explanation on how to use Dyspnea scale     Expected Outcomes Short Term: Able to use Dyspnea scale daily in rehab to express subjective sense of shortness of breath during exertion;Long Term: Able to use Dyspnea scale to guide intensity level when exercising independently Short Term: Able to use Dyspnea scale daily in rehab to express subjective sense of shortness of breath during exertion;Long Term: Able to use Dyspnea scale to guide intensity level when exercising independently Short Term: Able to use Dyspnea scale daily in rehab to express subjective sense of shortness of breath during exertion;Long Term: Able to use Dyspnea scale to guide intensity level when exercising independently     Knowledge and understanding of Target  Heart Rate Range (THRR) Yes Yes Yes     Intervention Provide education and explanation of THRR including how the numbers were predicted and where they are located for reference Provide education and explanation of THRR including how the numbers were predicted and where they are located for reference Provide education and explanation of THRR including how the numbers were predicted and where they are located for reference     Expected Outcomes Short Term: Able to state/look up THRR;Short Term: Able to use daily as guideline for intensity in rehab;Long Term: Able to use THRR to govern intensity when exercising independently Short Term: Able to state/look up THRR;Short Term: Able to use daily as guideline for intensity in rehab;Long Term: Able to use THRR to govern intensity when exercising independently Short Term: Able to state/look up THRR;Short Term: Able to use daily as  guideline for intensity in rehab;Long Term: Able to use THRR to govern intensity when exercising independently     Understanding of Exercise Prescription Yes Yes Yes     Intervention Provide education, explanation, and written materials on patient's individual exercise prescription Provide education, explanation, and written materials on patient's individual exercise prescription Provide education, explanation, and written materials on patient's individual exercise prescription     Expected Outcomes Short Term: Able to explain program exercise prescription;Long Term: Able to explain home exercise prescription to exercise independently Short Term: Able to explain program exercise prescription;Long Term: Able to explain home exercise prescription to exercise independently Short Term: Able to explain program exercise prescription;Long Term: Able to explain home exercise prescription to exercise independently              Exercise Goals Re-Evaluation:  Exercise Goals Re-Evaluation     [place] Name 05/06/21 0730 06/02/21 0921           Exercise Goal Re-Evaluation   Exercise Goals Review Increase Physical Activity;Able to understand and use Dyspnea scale;Understanding of Exercise Prescription;Increase Strength and Stamina;Knowledge and understanding of Target Heart Rate Range (THRR);Able to understand and use rate of perceived exertion (RPE) scale Increase Physical Activity;Able to understand and use Dyspnea scale;Understanding of Exercise Prescription;Increase Strength and Stamina;Knowledge and understanding of Target Heart Rate Range (THRR);Able to understand and use rate of perceived exertion (RPE) scale      Comments Laurey Arrow has completed 2 exercise sessions. He tolerates exercise well and exercises for 15 min on the track and Nustep. He averages 3.09 METs on the track and 1.9 METS at level 2 on the Nustep. He performs the warmup and cooldown standing up without limitations. It is too soon to note any  significant progressions. Laurey Arrow is motivtated to exercise and improve his functional capacity. Will continue to monitor and progress as able. Laurey Arrow has completed 10 exercise sessions. He still tolerates exercise well and exercises for 15 min on the track and Nustep. He averages 3.21 METs on the track and 2.1 METS at level 4 on the Nustep. He has somewhat progressed on the track and has increased his level a couple of times on the Nustep since starting. I mentioned to him that walking faster on the track increases his METs. He performs the warmup and cooldown standing up without limitations. Laurey Arrow seems motivated to exercise. Will continue to monitor and progress as able.      Expected Outcomes Through exercise at rehab and home, the pt will decrease shortness of breath with daily activities and feel confident in carrying out an exercise regimen at home. Through exercise at rehab and home,  the pt will decrease shortness of breath with daily activities and feel confident in carrying out an exercise regimen at home.               Nutrition & Weight - Outcomes:  Pre Biometrics - 04/23/21 0950       Pre Biometrics   Height 5' 10.5" (1.791 m)    Weight 108.7 kg    BMI (Calculated) 33.89    Grip Strength 37 kg              Nutrition:   Nutrition Discharge:   Education Questionnaire Score:  Knowledge Questionnaire Score - 06/05/21 1447       Knowledge Questionnaire Score   Post Score 15/18             Goals reviewed with patient; copy given to patient.

## 2021-06-10 ENCOUNTER — Encounter (HOSPITAL_COMMUNITY): Payer: Medicare HMO

## 2021-06-12 ENCOUNTER — Encounter (HOSPITAL_COMMUNITY): Payer: Medicare HMO

## 2021-06-17 ENCOUNTER — Encounter (HOSPITAL_COMMUNITY): Payer: Medicare HMO

## 2021-06-19 ENCOUNTER — Encounter (HOSPITAL_COMMUNITY): Payer: Medicare HMO

## 2021-06-24 ENCOUNTER — Encounter (HOSPITAL_COMMUNITY): Payer: Medicare HMO

## 2021-07-22 DIAGNOSIS — H532 Diplopia: Secondary | ICD-10-CM | POA: Diagnosis not present

## 2021-07-22 DIAGNOSIS — H509 Unspecified strabismus: Secondary | ICD-10-CM | POA: Diagnosis not present

## 2021-07-27 ENCOUNTER — Encounter: Payer: Self-pay | Admitting: Internal Medicine

## 2021-07-28 MED ORDER — TRELEGY ELLIPTA 100-62.5-25 MCG/ACT IN AEPB: 1.0000 | INHALATION_SPRAY | Freq: Every day | RESPIRATORY_TRACT | 11 refills | Status: DC

## 2021-08-05 DIAGNOSIS — H532 Diplopia: Secondary | ICD-10-CM | POA: Diagnosis not present

## 2021-08-21 DIAGNOSIS — H5021 Vertical strabismus, right eye: Secondary | ICD-10-CM | POA: Diagnosis not present

## 2021-08-21 DIAGNOSIS — H532 Diplopia: Secondary | ICD-10-CM | POA: Diagnosis not present

## 2021-08-21 DIAGNOSIS — H509 Unspecified strabismus: Secondary | ICD-10-CM | POA: Diagnosis not present

## 2021-08-21 DIAGNOSIS — Z961 Presence of intraocular lens: Secondary | ICD-10-CM | POA: Diagnosis not present

## 2021-08-21 DIAGNOSIS — G529 Cranial nerve disorder, unspecified: Secondary | ICD-10-CM | POA: Diagnosis not present

## 2021-09-12 DIAGNOSIS — H532 Diplopia: Secondary | ICD-10-CM | POA: Diagnosis not present

## 2021-09-16 ENCOUNTER — Ambulatory Visit: Payer: Medicare HMO | Admitting: Family Medicine

## 2021-09-17 ENCOUNTER — Encounter: Payer: Self-pay | Admitting: Internal Medicine

## 2021-09-17 NOTE — Assessment & Plan Note (Signed)
Feels control is stable with Trelegy and his albuterol rescue inhaler.  I encouraged him to stick with pulmonary rehab and move on to Silver sneakers as planned.  Ventolin refilled.

## 2021-09-17 NOTE — Assessment & Plan Note (Signed)
Benefits from BiPAP use with good compliance and control Plan: Continue 15/11, PS3

## 2021-09-22 ENCOUNTER — Ambulatory Visit (INDEPENDENT_AMBULATORY_CARE_PROVIDER_SITE_OTHER): Payer: Medicare HMO | Admitting: Family Medicine

## 2021-09-22 VITALS — BP 130/70 | HR 68 | Temp 97.8°F | Ht 71.0 in | Wt 239.0 lb

## 2021-09-22 DIAGNOSIS — E039 Hypothyroidism, unspecified: Secondary | ICD-10-CM

## 2021-09-22 DIAGNOSIS — R768 Other specified abnormal immunological findings in serum: Secondary | ICD-10-CM | POA: Diagnosis not present

## 2021-09-22 DIAGNOSIS — H532 Diplopia: Secondary | ICD-10-CM | POA: Diagnosis not present

## 2021-09-22 NOTE — Progress Notes (Signed)
Established Patient Office Visit  Subjective:  Patient ID: Kyle [name], male    DOB: [birth year]  Age: 76 y.o. MRN: [id number]  CC:  Chief Complaint  Patient presents with   Thyroid Problem    HPI Kyle [name] presents for evaluation of recent abnormal thyroid test through ophthalmology at [facility].  Patient has been seeing neurology and ophthalmology since December.  He has had some binocular diplopia.  He states symptoms tend to be worse late in the day.  He has had fairly extensive work-up with CAT scan and reportedly MRI and also single-fiber EMG all apparently normal.  He had anti-TPO antibody which came back elevated at 57 with normal range of less than 9.  His acetylcholine receptor binding antibody came back normal.  He apparently took thyroid replacement years ago but was taken off and has had normal thyroid function since then though none recently.  He denies any overt symptoms of hyperthyroidism.  No neck pain.  No overt symptoms of hypothyroidism.  He states he had younger brother who had some type of thyroid disorder in his 47s was not sure regarding details  Past Medical History:  Diagnosis Date   Anemia    CAD (coronary artery disease) 08/03/2009   Cardiac arrhythmia    COPD (chronic obstructive pulmonary disease) (HCC)    GERD (gastroesophageal reflux disease)    Hyperlipidemia    Hypertension    Hypothyroidism    Myocardial infarction ([place]) 08/03/2009   Sleep apnea treated with nocturnal BiPAP     Past Surgical History:  Procedure Laterality Date   APPENDECTOMY     CARDIAC CATHETERIZATION N/A 05/05/2016   Procedure: Left Heart Cath and Coronary Angiography;  Surgeon: Jerline Pain, MD;  Location: [facility];  Service: Cardiovascular;  Laterality: N/A;   CORONARY ANGIOPLASTY WITH STENT PLACEMENT     EYE SURGERY     lasix /BIL   TONSILLECTOMY      Family History  Problem Relation Age of Onset   Stroke Mother    Hypertension Mother     Stroke Father    Hypotension Father    Multiple sclerosis Sister    Thyroid disease Brother     Social History   Socioeconomic History   Marital status: Married    Spouse name: Not on file   Number of children: 0   Years of education: Not on file   Highest education level: 12th grade  Occupational History   Occupation: retired  Tobacco Use   Smoking status: Former    Packs/day: 4.00    Years: 28.00    Pack years: 112.00    Types: Cigarettes    Quit date: 12/10/1993    Years since quitting: 27.8   Smokeless tobacco: Never  Vaping Use   Vaping Use: Never used  Substance and Sexual Activity   Alcohol use: Yes    Alcohol/week: 5.0 standard drinks    Types: 5 Standard drinks or equivalent per week    Comment: once every 2 months   Drug use: No   Sexual activity: Not on file  Other Topics Concern   Not on file  Social History Narrative   Not on file   Social Determinants of Health   Financial Resource Strain: Low Risk    Difficulty of Paying Living Expenses: Not hard at all  Food Insecurity: No Food Insecurity   Worried About [place] in the Last Year: Never true   Ran Out  of Food in the Last Year: Never true  Transportation Needs: No Transportation Needs   Lack of Transportation (Medical): No   Lack of Transportation (Non-Medical): No  Physical Activity: Inactive   Days of Exercise per Week: 0 days   Minutes of Exercise per Session: 30 min  Stress: No Stress Concern Present   Feeling of Stress : Not at all  Social Connections: Unknown   Frequency of Communication with Friends and Family: Twice a week   Frequency of Social Gatherings with Friends and Family: Patient refused   Attends Religious Services: More than 4 times per year   Active Member of [employer] or Organizations: No   Attends Music therapist: 1 to 4 times per year   Marital Status: Married  Human resources officer Violence: Not At Risk   Fear of Current or Ex-Partner: No   Emotionally  Abused: No   Physically Abused: No   Sexually Abused: No    Outpatient Medications Prior to Visit  Medication Sig Dispense Refill   albuterol (VENTOLIN HFA) 108 (90 Base) MCG/ACT inhaler Inhale 2 puffs into the lungs every 6 (six) hours as needed for wheezing or shortness of breath. 8 g 12   aspirin 81 MG tablet Take 81 mg by mouth daily.     atorvastatin (LIPITOR) 40 MG tablet TAKE ONE TABLET BY MOUTH ONE TIME DAILY AT 6 IN THE EVENING 90 tablet 2   benazepril-hydrochlorthiazide (LOTENSIN HCT) 20-25 MG tablet TAKE ONE-HALF TABLET BY MOUTH ONE TIME DAILY 45 tablet 3   cetirizine (ZYRTEC) 10 MG tablet Take 10 mg by mouth daily.     Cyanocobalamin (B-12 PO) Take 1 tablet by mouth daily.     ferrous sulfate 325 (65 FE) MG tablet Take 1 tablet (325 mg total) by mouth daily with breakfast. 90 tablet 3   Fluticasone-Umeclidin-Vilant (TRELEGY ELLIPTA) 100-62.5-25 MCG/ACT AEPB Inhale 1 puff into the lungs daily. 1 each 11   Fluticasone-Umeclidin-Vilant (TRELEGY ELLIPTA) 100-62.5-25 MCG/INH AEPB Inhale 1 puff into the lungs daily. 60 each 12   Multiple Vitamin (MULTIVITAMIN) tablet Take 1 tablet by mouth daily.     valACYclovir (VALTREX) 1000 MG tablet 1 g.     No facility-administered medications prior to visit.    No Known Allergies  ROS Review of Systems  Constitutional:  Negative for appetite change, chills, fever and unexpected weight change.  Eyes:  Positive for visual disturbance. Negative for pain.  Respiratory:  Negative for cough and shortness of breath.   Cardiovascular:  Negative for chest pain.  Endocrine: Negative for cold intolerance and heat intolerance.  Neurological:  Negative for dizziness.     Objective:    Physical Exam Vitals reviewed.  Constitutional:      Appearance: Normal appearance.  Neck:     Comments: No thyroid nodules palpated. Cardiovascular:     Rate and Rhythm: Normal rate and regular rhythm.  Pulmonary:     Effort: Pulmonary effort is normal.      Breath sounds: Normal breath sounds. No wheezing or rales.  Neurological:     Mental Status: He is alert.    BP 130/70    Pulse 68    Temp 97.8 F (36.6 C) (Oral)    Ht 5\' 11"  (1.803 m)    Wt 239 lb (108.4 kg)    SpO2 95%    BMI 33.33 kg/m  Wt Readings from Last 3 Encounters:  09/22/21 239 lb (108.4 kg)  05/26/21 239 lb 12.8 oz (108.8 kg)  05/20/21  236 lb 15.9 oz (107.5 kg)     Health Maintenance Due  Topic Date Due   COVID-19 Vaccine (5 - Booster for Pfizer series) 02/01/2021    There are no preventive care reminders to display for this patient.  Lab Results  Component Value Date   TSH 3.40 11/04/2020   Lab Results  Component Value Date   WBC 4.5 01/17/2021   HGB 10.1 (L) 01/17/2021   HCT 32.0 (L) 01/17/2021   MCV 80.8 01/17/2021   PLT 254.0 01/17/2021   Lab Results  Component Value Date   NA 139 08/09/2020   K 4.2 08/09/2020   CO2 30 08/09/2020   GLUCOSE 97 08/09/2020   BUN 18 08/09/2020   CREATININE 1.10 01/29/2021   BILITOT 0.4 08/09/2020   ALKPHOS 82 08/09/2020   AST 28 08/09/2020   ALT 31 08/09/2020   PROT 7.2 08/09/2020   ALBUMIN 4.5 08/09/2020   CALCIUM 9.7 08/09/2020   GFR 68.03 08/09/2020   Lab Results  Component Value Date   CHOL 103 08/09/2020   Lab Results  Component Value Date   HDL 23.30 (L) 08/09/2020   Lab Results  Component Value Date   LDLCALC 43 08/09/2020   Lab Results  Component Value Date   TRIG 184.0 (H) 08/09/2020   Lab Results  Component Value Date   CHOLHDL 4 08/09/2020   Lab Results  Component Value Date   HGBA1C 7.1 (H) 11/04/2020      Assessment & Plan:   Problem List Items Addressed This Visit   None Visit Diagnoses     Binocular vision disorder with diplopia    -  Primary   Relevant Orders   TSH   T4, Free   T3, Free   Anti-TPO antibodies present       Relevant Orders   TSH   T4, Free   T3, Free     Patient had recent elevated anti-TPO antibodies.  We explained this could be seen with  autoimmune disorder such as Hashimoto thyroiditis or Graves' disease.  He is asymptomatic.  Being evaluated per ophthalmology for binocular diplopia.  -Check TSH along with free T4 and free T3.  No orders of the defined types were placed in this encounter.   Follow-up: No follow-ups on file.    Carolann Littler, MD

## 2021-09-23 LAB — T3, FREE: T3, Free: 3.8 pg/mL (ref 2.3–4.2)

## 2021-09-23 LAB — T4, FREE: Free T4: 0.77 ng/dL (ref 0.60–1.60)

## 2021-09-23 LAB — TSH: TSH: 5.53 u[IU]/mL — ABNORMAL HIGH (ref 0.35–5.50)

## 2021-09-23 NOTE — Addendum Note (Signed)
Addended by: Agnes Lawrence on: 09/23/2021 03:39 PM   Modules accepted: Orders

## 2021-10-06 IMAGING — DX DG CHEST 2V
2 series · 2 of 2 positions shown · non-contrast
Comparison: 06/10/2017

CLINICAL DATA: COPD

EXAM:
CHEST - 2 VIEW

[chest pa]
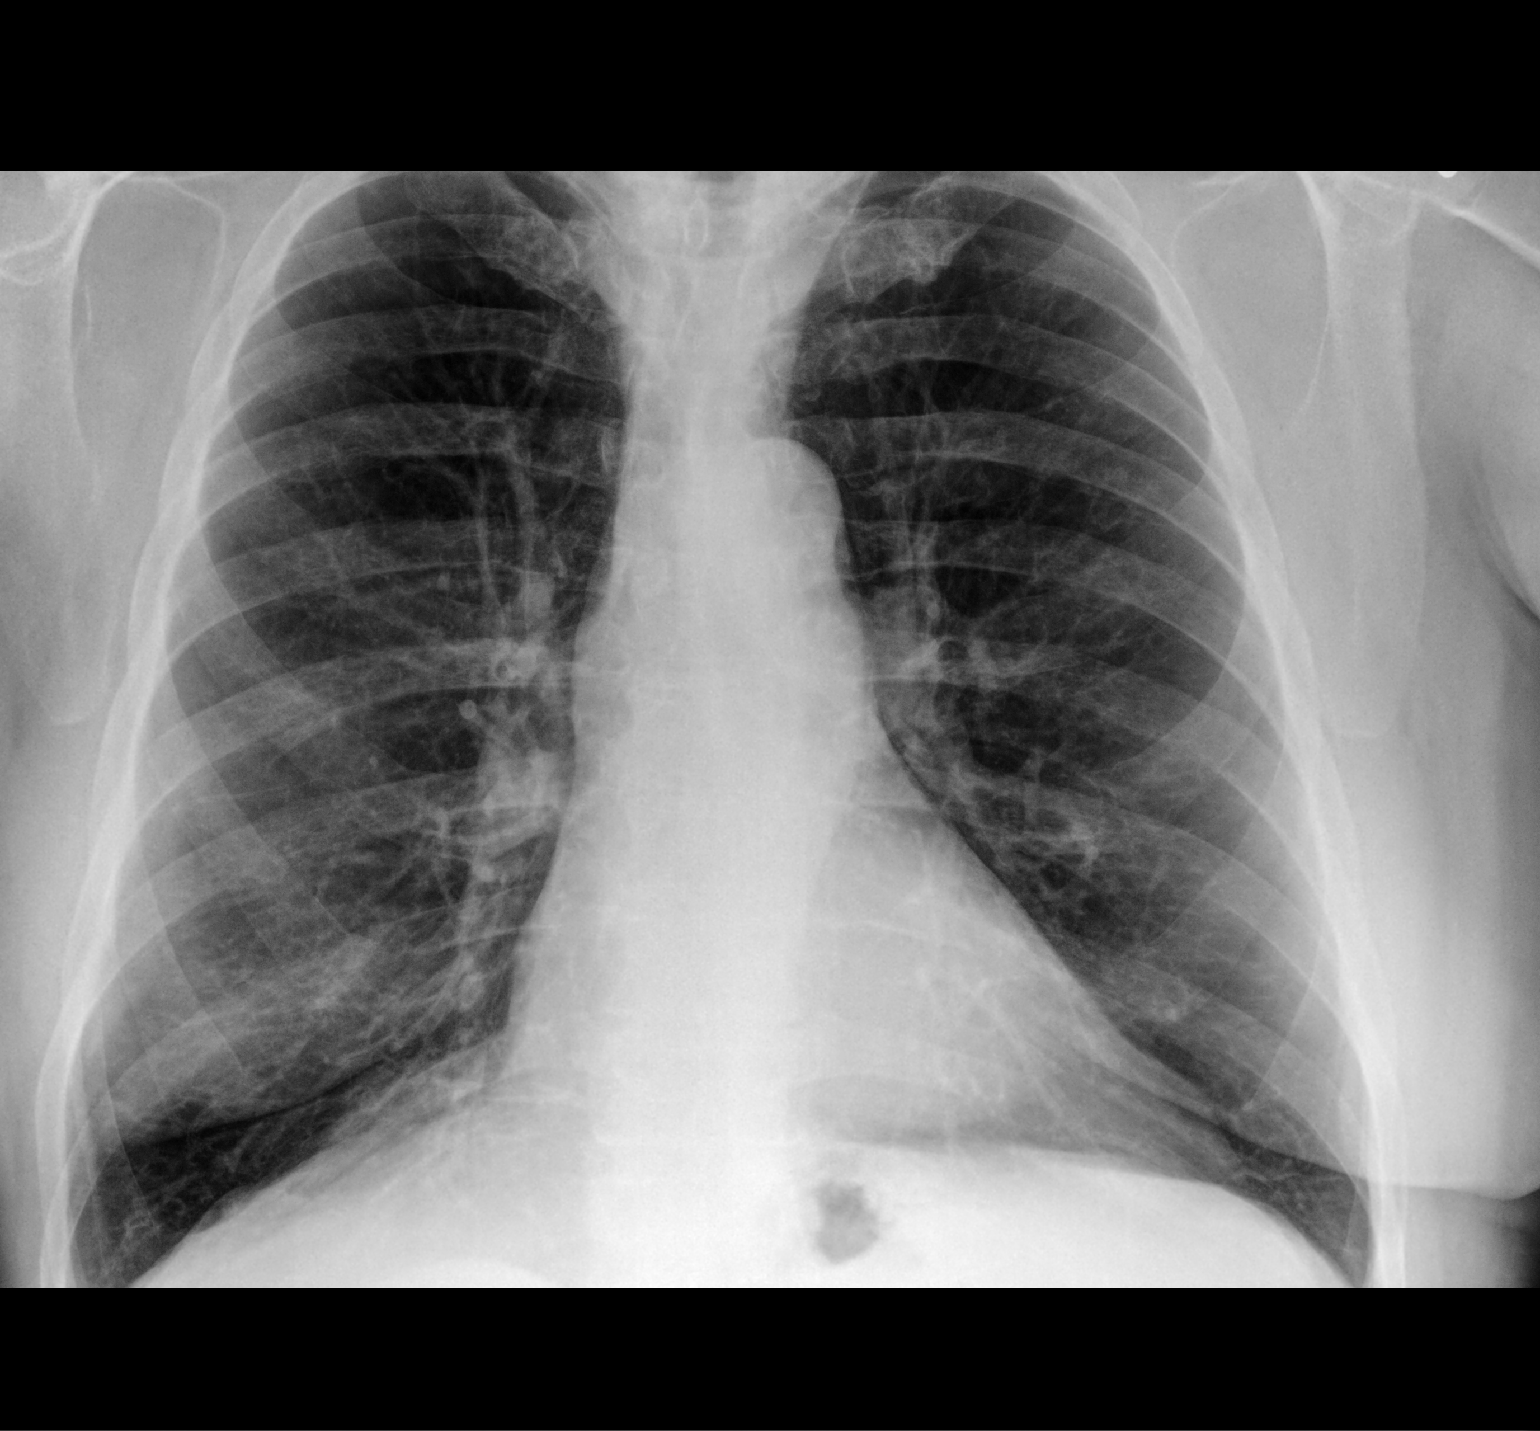

[chest lat]
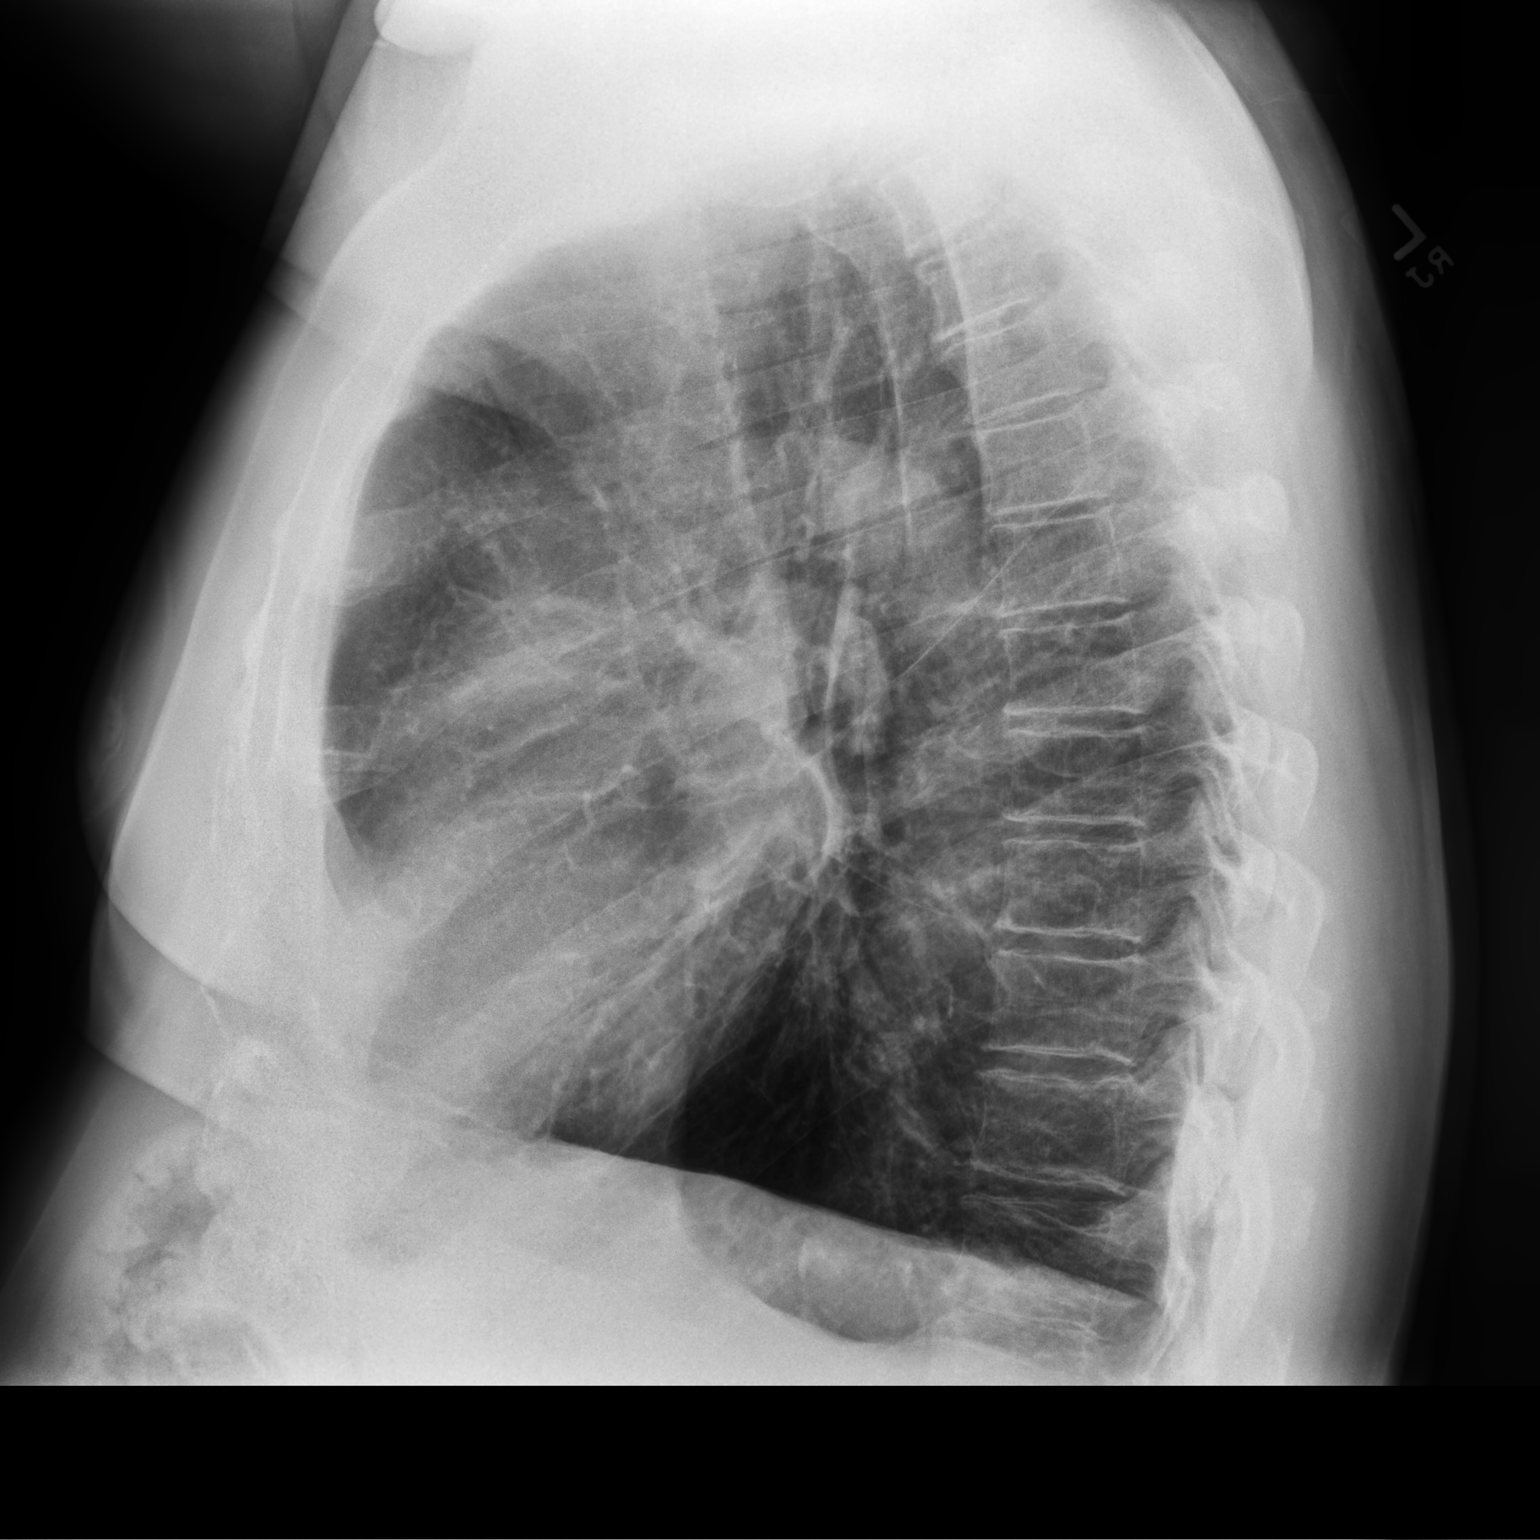

[2 of 2 positions shown; findings below may reference images not displayed]

FINDINGS: Lungs are hyperinflated as can be seen with COPD. No focal
consolidation. No pleural effusion or pneumothorax. Heart and
mediastinal contours are unremarkable.

No acute osseous abnormality.
IMPRESSION: No active cardiopulmonary disease.

## 2021-10-13 ENCOUNTER — Ambulatory Visit (INDEPENDENT_AMBULATORY_CARE_PROVIDER_SITE_OTHER): Payer: Medicare HMO

## 2021-10-13 VITALS — Ht 71.0 in | Wt 239.0 lb

## 2021-10-13 DIAGNOSIS — Z0000 Encounter for general adult medical examination without abnormal findings: Secondary | ICD-10-CM

## 2021-10-13 NOTE — Progress Notes (Signed)
Subjective:   Kyle [name] is a 76 y.o. male who presents for Medicare Annual/Subsequent preventive examination.  Review of Systems    Virtual Visit via Telephone Note  I connected with  Kyle [name] on 10/13/21 at 12:30 PM EDT by telephone and verified that I am speaking with the correct person using two identifiers.  Location: Patient: Home Provider: Office Persons participating in the virtual visit: patient/Nurse Health Advisor   I discussed the limitations, risks, security and privacy concerns of performing an evaluation and management service by telephone and the availability of in person appointments. The patient expressed understanding and agreed to proceed.  Interactive audio and video telecommunications were attempted between this nurse and patient, however failed, due to patient having technical difficulties OR patient did not have access to video capability.  We continued and completed visit with audio only.  Some vital signs may be absent or patient reported.   Kyle Peaches, LPN  Cardiac Risk Factors include: advanced age (>42mn, >>60women);hypertension;male gender;Other (see comment), Risk factor comments: COPD     Objective:    Today's Vitals   10/13/21 1233  Weight: 239 lb (108.4 kg)  Height: '5\' 11"'$  (1.803 m)   Body mass index is 33.33 kg/m.  Advanced Directives 10/13/2021 04/23/2021 10/16/2020 08/24/2018 03/18/2018 05/05/2016  Does Patient Have a Medical Advance Directive? Yes Yes Yes No Yes Yes  Type of AParamed[place]of [place] will Living will [place]-  Does patient want to make changes to medical advance directive? No - Patient declined No - Patient declined - - - -  Copy of [place] Chart? No - copy requested - No - copy requested - No - copy requested No - copy requested  Would patient like information on creating a medical advance  directive? - - - No - Patient declined - -    Current Medications (verified) Outpatient Encounter Medications as of 10/13/2021  Medication Sig   albuterol (VENTOLIN HFA) 108 (90 Base) MCG/ACT inhaler Inhale 2 puffs into the lungs every 6 (six) hours as needed for wheezing or shortness of breath.   aspirin 81 MG tablet Take 81 mg by mouth daily.   atorvastatin (LIPITOR) 40 MG tablet TAKE ONE TABLET BY MOUTH ONE TIME DAILY AT 6 IN THE EVENING   benazepril-hydrochlorthiazide (LOTENSIN HCT) 20-25 MG tablet TAKE ONE-HALF TABLET BY MOUTH ONE TIME DAILY   cetirizine (ZYRTEC) 10 MG tablet Take 10 mg by mouth daily.   Cyanocobalamin (B-12 PO) Take 1 tablet by mouth daily.   ferrous sulfate 325 (65 FE) MG tablet Take 1 tablet (325 mg total) by mouth daily with breakfast.   Fluticasone-Umeclidin-Vilant (TRELEGY ELLIPTA) 100-62.5-25 MCG/ACT AEPB Inhale 1 puff into the lungs daily.   Fluticasone-Umeclidin-Vilant (TRELEGY ELLIPTA) 100-62.5-25 MCG/INH AEPB Inhale 1 puff into the lungs daily.   Multiple Vitamin (MULTIVITAMIN) tablet Take 1 tablet by mouth daily.   valACYclovir (VALTREX) 1000 MG tablet 1 g.   No facility-administered encounter medications on file as of 10/13/2021.    Allergies (verified) Patient has no known allergies.   History: Past Medical History:  Diagnosis Date   Anemia    CAD (coronary artery disease) 08/03/2009   Cardiac arrhythmia    COPD (chronic obstructive pulmonary disease) (HCC)    GERD (gastroesophageal reflux disease)    Hyperlipidemia    Hypertension    Hypothyroidism    [place]  Sleep apnea treated with nocturnal BiPAP    Past Surgical History:  Procedure Laterality Date   APPENDECTOMY     CARDIAC CATHETERIZATION N/A 05/05/2016   Procedure: Left Heart Cath and Coronary Angiography;  Surgeon: Jerline Pain, MD;  Location: [facility];  Service: Cardiovascular;  Laterality: N/A;   CORONARY ANGIOPLASTY WITH STENT PLACEMENT      EYE SURGERY     lasix /BIL   TONSILLECTOMY     Family History  Problem Relation Age of Onset   Stroke Mother    Hypertension Mother    Stroke Father    Hypotension Father    Multiple sclerosis Sister    Thyroid disease Brother    Social History   Socioeconomic History   Marital status: Married    Spouse name: Not on file   Number of children: 0   Years of education: Not on file   Highest education level: 12th grade  Occupational History   Occupation: retired  Tobacco Use   Smoking status: Former    Packs/day: 4.00    Years: 28.00    Pack years: 112.00    Types: Cigarettes    Quit date: 12/10/1993    Years since quitting: 27.8   Smokeless tobacco: Never  Vaping Use   Vaping Use: Never used  Substance and Sexual Activity   Alcohol use: Yes    Alcohol/week: 5.0 standard drinks    Types: 5 Standard drinks or equivalent per week    Comment: once every 2 months   Drug use: No   Sexual activity: Not on file  Other Topics Concern   Not on file  Social History Narrative   Not on file   Social Determinants of Health   Financial Resource Strain: Low Risk    Difficulty of Paying Living Expenses: Not hard at all  Food Insecurity: No Food Insecurity   Worried About Charity fundraiser in the Last Year: Never true   [place] in the Last Year: Never true  Transportation Needs: No Transportation Needs   Lack of Transportation (Medical): No   Lack of Transportation (Non-Medical): No  Physical Activity: Sufficiently Active   Days of Exercise per Week: 3 days   Minutes of Exercise per Session: 90 min  Stress: No Stress Concern Present   Feeling of Stress : Not at all  Social Connections: Socially Integrated   Frequency of Communication with Friends and Family: More than three times a week   Frequency of Social Gatherings with Friends and Family: More than three times a week   Attends Religious Services: More than 4 times per year   Active Member of [employer] or  Organizations: Yes   Attends Music therapist: More than 4 times per year   Marital Status: Married    Clinical Intake:  Pre-visit preparation completed: NoDiabetic?  No  Interpreter Needed?: NoActivities of Daily Living In your present state of health, do you have any difficulty performing the following activities: 10/13/2021 10/16/2020  Hearing? N N  Vision? N N  Difficulty concentrating or making decisions? N N  Walking or climbing stairs? N N  Dressing or bathing? N N  Doing errands, shopping? N N  Preparing Food and eating ? N N  Using the Toilet? N N  In the past six months, have you accidently leaked urine? N N  Do you have problems with loss of bowel control? N N  Managing your Medications? N N  Managing  your Finances? N N  Housekeeping or managing your Housekeeping? N N  Some recent data might be hidden    Patient Care Team: Eulas Post, MD as PCP - General (Family Medicine) Jerline Pain, MD as PCP - Cardiology (Cardiology)  Indicate any recent Medical Services you may have received from other than [facility] providers in the past year (date may be approximate).     Assessment:   This is a routine wellness examination for [place].  Hearing/Vision screen Hearing Screening - Comments:: No difficulty hearing Vision Screening - Comments:: Wears glasses and left eye contact. Followed by Dr Kern Reap  Dietary issues and exercise activities discussed: Exercise limited by: None identified   Goals Addressed               This Visit's Progress     Patient Stated (pt-stated)        Would like to get back into the gym.       Depression Screen PHQ 2/9 Scores 10/13/2021 04/23/2021 04/23/2021 10/16/2020 04/19/2018 03/13/2016 06/25/2014  PHQ - 2 Score 0 0 0 0 0 0 0  PHQ- 9 Score - 0 - - - - -    Fall Risk Fall Risk  10/13/2021 09/19/2021 09/15/2021 09/15/2021 04/23/2021  Falls in the past year? 0 0 0 0 0  Number falls in past yr: 0 - - - 0  Injury with  Fall? 0 - - - -  Risk for fall due to : No Fall Risks - - - -  Follow up - - - - Falls prevention discussed    FALL RISK PREVENTION PERTAINING TO THE HOME:  Any stairs in or around the home? No  If so, are there any without handrails? No  Home free of loose throw rugs in walkways, pet beds, electrical cords, etc? Yes  Adequate lighting in your home to reduce risk of falls? Yes   ASSISTIVE DEVICES UTILIZED TO PREVENT FALLS:  Life alert? No  Use of a cane, walker or w/c? No  Grab bars in the bathroom? Yes  Shower chair or bench in shower? Yes  Elevated toilet seat or a handicapped toilet? Yes   TIMED UP AND GO:  Was the test performed? No . Audio Visit  Cognitive Function:     6CIT Screen 10/13/2021 10/16/2020  What Year? 0 points 0 points  What month? 0 points 0 points  What time? 0 points -  Count back from 20 0 points 0 points  Months in reverse 0 points 0 points  Repeat phrase 0 points 0 points  Total Score 0 -    Immunizations Immunization History  Administered Date(s) Administered   Fluad Quad(high Dose 65+) 04/11/2019, 04/15/2020, 05/12/2021   Influenza Split 05/17/2012, 05/10/2014, 05/03/2016, 04/03/2017   Influenza, High Dose Seasonal PF 04/03/2016, 04/06/2017, 04/03/2018, 04/19/2018   Influenza,inj,Quad PF,6+ Mos 04/13/2013, 04/29/2015   Influenza-Unspecified 04/11/2019   PFIZER(Purple Top)SARS-COV-2 Vaccination 09/10/2019, 10/04/2019, 05/03/2020, 12/07/2020   Pneumococcal Conjugate-13 03/13/2016   Pneumococcal Polysaccharide-23 05/17/2012   Tdap 11/12/2011, 03/13/2016, 08/24/2018   Zoster Recombinat (Shingrix) 08/23/2018, 02/20/2019   Zoster, Live 06/16/2013    TDAP status: Up to date  Flu Vaccine status: Up to date  Pneumococcal vaccine status: Up to date  Covid-19 vaccine status: Completed vaccines  Qualifies for Shingles Vaccine? Yes   Zostavax completed Yes   Shingrix Completed?: Yes  Screening Tests Health Maintenance  Topic Date Due    COVID-19 Vaccine (5 - Booster for [place] series) 02/01/2021  TETANUS/TDAP  08/24/2028   COLONOSCOPY (Pts 45-24yr Insurance coverage will need to be confirmed)  04/22/2031   Pneumonia Vaccine 75 Years old  Completed   INFLUENZA VACCINE  Completed   Hepatitis C Screening  Completed   Zoster Vaccines- Shingrix  Completed   HPV VACCINES  Aged Out    Health Maintenance  Health Maintenance Due  Topic Date Due   COVID-19 Vaccine (5 - Booster for [place]) 02/01/2021    Colorectal cancer screening: Type of screening: Colonoscopy. Completed 04/21/21. Repeat every 10 years  Lung Cancer Screening: (Low Dose CT Chest recommended if Age 525-80years, 30 pack-year currently smoking OR have quit w/in 15years.) does not qualify.     Additional Screening:  Hepatitis C Screening: does qualify; Completed 04/19/18  Vision Screening: Recommended annual ophthalmology exams for early detection of glaucoma and other disorders of the eye. Is the patient up to date with their annual eye exam?  Yes  Who is the provider or what is the name of the office in which the patient attends annual eye exams? Dr JKern ReapIf pt is not established with a provider, would they like to be referred to a provider to establish care? No .   Dental Screening: Recommended annual dental exams for proper oral hygiene  Community Resource Referral / Chronic Care Management:  CRR required this visit?  No   CCM required this visit?  No      Plan:     I have personally reviewed and noted the following in the patients chart:   Medical and social history Use of alcohol, tobacco or illicit drugs  Current medications and supplements including opioid prescriptions. Patient is not currently taking opioid prescriptions. Functional ability and status Nutritional status Physical activity Advanced directives List of other physicians Hospitalizations, surgeries, and ER visits in previous 12 months Vitals Screenings  to include cognitive, depression, and falls Referrals and appointments  In addition, I have reviewed and discussed with patient certain preventive protocols, quality metrics, and best practice recommendations. A written personalized care plan for preventive services as well as general preventive health recommendations were provided to patient.     BCriselda Peaches LPN   [id number]  Nurse Notes: None

## 2021-10-13 NOTE — Patient Instructions (Addendum)
Kyle [name] , Thank you for taking time to come for your Medicare Wellness Visit. I appreciate your ongoing commitment to your health goals. Please review the following plan we discussed and let me know if I can assist you in the future.   These are the goals we discussed:  Goals       Patient Stated (pt-stated)      Would like to get back into the gym.        This is a list of the screening recommended for you and due dates:  Health Maintenance  Topic Date Due   COVID-19 Vaccine (5 - Booster for Pfizer series) 02/01/2021   Tetanus Vaccine  08/24/2028   Colon Cancer Screening  04/22/2031   Pneumonia Vaccine  Completed   Flu Shot  Completed   Hepatitis C Screening: USPSTF Recommendation to screen - Ages 18-79 yo.  Completed   Zoster (Shingles) Vaccine  Completed   HPV Vaccine  Aged Out    Advanced directives: Yes Patient will bring copy  Conditions/risks identified: None  Next appointment: Follow up in one year for your annual wellness visit.   Preventive Care 16 Years and Older, Male Preventive care refers to lifestyle choices and visits with your health care provider that can promote health and wellness. What does preventive care include? A yearly physical exam. This is also called an annual well check. Dental exams once or twice a year. Routine eye exams. Ask your health care provider how often you should have your eyes checked. Personal lifestyle choices, including: Daily care of your teeth and gums. Regular physical activity. Eating a healthy diet. Avoiding tobacco and drug use. Limiting alcohol use. Practicing safe sex. Taking low doses of aspirin every day. Taking vitamin and mineral supplements as recommended by your health care provider. What happens during an annual well check? The services and screenings done by your health care provider during your annual well check will depend on your age, overall health, lifestyle risk factors, and family history of  disease. Counseling  Your health care provider may ask you questions about your: Alcohol use. Tobacco use. Drug use. Emotional well-being. Home and relationship well-being. Sexual activity. Eating habits. History of falls. Memory and ability to understand (cognition). Work and work Statistician. Screening  You may have the following tests or measurements: Height, weight, and BMI. Blood pressure. Lipid and cholesterol levels. These may be checked every 5 years, or more frequently if you are over 30 years old. Skin check. Lung cancer screening. You may have this screening every year starting at age 7 if you have a 30-pack-year history of smoking and currently smoke or have quit within the past 15 years. Fecal occult blood test (FOBT) of the stool. You may have this test every year starting at age 64. Flexible sigmoidoscopy or colonoscopy. You may have a sigmoidoscopy every 5 years or a colonoscopy every 10 years starting at age 28. Prostate cancer screening. Recommendations will vary depending on your family history and other risks. Hepatitis C blood test. Hepatitis B blood test. Sexually transmitted disease (STD) testing. Diabetes screening. This is done by checking your blood sugar (glucose) after you have not eaten for a while (fasting). You may have this done every 1-3 years. Abdominal aortic aneurysm (AAA) screening. You may need this if you are a current or former smoker. Osteoporosis. You may be screened starting at age 27 if you are at high risk. Talk with your health care provider about your test results, treatment  options, and if necessary, the need for more tests. Vaccines  Your health care provider may recommend certain vaccines, such as: Influenza vaccine. This is recommended every year. Tetanus, diphtheria, and acellular pertussis (Tdap, Td) vaccine. You may need a Td booster every 10 years. Zoster vaccine. You may need this after age 7. Pneumococcal 13-valent  conjugate (PCV13) vaccine. One dose is recommended after age 37. Pneumococcal polysaccharide (PPSV23) vaccine. One dose is recommended after age 61. Talk to your health care provider about which screenings and vaccines you need and how often you need them. This information is not intended to replace advice given to you by your health care provider. Make sure you discuss any questions you have with your health care provider. Document Released: 08/16/2015 Document Revised: 04/08/2016 Document Reviewed: 05/21/2015 Elsevier Interactive Patient Education  2017 [place] Prevention in the Home Falls can cause injuries. They can happen to people of all ages. There are many things you can do to make your home safe and to help prevent falls. What can I do on the outside of my home? Regularly fix the edges of walkways and driveways and fix any cracks. Remove anything that might make you trip as you walk through a door, such as a raised step or threshold. Trim any bushes or trees on the path to your home. Use bright outdoor lighting. Clear any walking paths of anything that might make someone trip, such as rocks or tools. Regularly check to see if handrails are loose or broken. Make sure that both sides of any steps have handrails. Any raised decks and porches should have guardrails on the edges. Have any leaves, snow, or ice cleared regularly. Use sand or salt on walking paths during winter. Clean up any spills in your garage right away. This includes oil or grease spills. What can I do in the bathroom? Use night lights. Install grab bars by the toilet and in the tub and shower. Do not use towel bars as grab bars. Use non-skid mats or decals in the tub or shower. If you need to sit down in the shower, use a plastic, non-slip stool. Keep the floor dry. Clean up any water that spills on the floor as soon as it happens. Remove soap buildup in the tub or shower regularly. Attach bath mats  securely with double-sided non-slip rug tape. Do not have throw rugs and other things on the floor that can make you trip. What can I do in the bedroom? Use night lights. Make sure that you have a light by your bed that is easy to reach. Do not use any sheets or blankets that are too big for your bed. They should not hang down onto the floor. Have a firm chair that has side arms. You can use this for support while you get dressed. Do not have throw rugs and other things on the floor that can make you trip. What can I do in the kitchen? Clean up any spills right away. Avoid walking on wet floors. Keep items that you use a lot in easy-to-reach places. If you need to reach something above you, use a strong step stool that has a grab bar. Keep electrical cords out of the way. Do not use floor polish or wax that makes floors slippery. If you must use wax, use non-skid floor wax. Do not have throw rugs and other things on the floor that can make you trip. What can I do with my stairs? Do not  leave any items on the stairs. Make sure that there are handrails on both sides of the stairs and use them. Fix handrails that are broken or loose. Make sure that handrails are as long as the stairways. Check any carpeting to make sure that it is firmly attached to the stairs. Fix any carpet that is loose or worn. Avoid having throw rugs at the top or bottom of the stairs. If you do have throw rugs, attach them to the floor with carpet tape. Make sure that you have a light switch at the top of the stairs and the bottom of the stairs. If you do not have them, ask someone to add them for you. What else can I do to help prevent falls? Wear shoes that: Do not have high heels. Have rubber bottoms. Are comfortable and fit you well. Are closed at the toe. Do not wear sandals. If you use a stepladder: Make sure that it is fully opened. Do not climb a closed stepladder. Make sure that both sides of the stepladder  are locked into place. Ask someone to hold it for you, if possible. Clearly mark and make sure that you can see: Any grab bars or handrails. First and last steps. Where the edge of each step is. Use tools that help you move around (mobility aids) if they are needed. These include: Canes. Walkers. Scooters. Crutches. Turn on the lights when you go into a dark area. Replace any light bulbs as soon as they burn out. Set up your furniture so you have a clear path. Avoid moving your furniture around. If any of your floors are uneven, fix them. If there are any pets around you, be aware of where they are. Review your medicines with your doctor. Some medicines can make you feel dizzy. This can increase your chance of falling. Ask your doctor what other things that you can do to help prevent falls. This information is not intended to replace advice given to you by your health care provider. Make sure you discuss any questions you have with your health care provider. Document Released: 05/16/2009 Document Revised: 12/26/2015 Document Reviewed: 08/24/2014 Elsevier Interactive Patient Education  2017 [employer].

## 2021-10-22 ENCOUNTER — Ambulatory Visit: Payer: Medicare HMO

## 2021-10-28 ENCOUNTER — Other Ambulatory Visit: Payer: Self-pay | Admitting: Cardiology

## 2021-11-24 DIAGNOSIS — B0233 Zoster keratitis: Secondary | ICD-10-CM | POA: Diagnosis not present

## 2021-11-24 DIAGNOSIS — Z961 Presence of intraocular lens: Secondary | ICD-10-CM | POA: Diagnosis not present

## 2021-11-24 DIAGNOSIS — H532 Diplopia: Secondary | ICD-10-CM | POA: Diagnosis not present

## 2021-11-24 DIAGNOSIS — Z9889 Other specified postprocedural states: Secondary | ICD-10-CM | POA: Diagnosis not present

## 2021-12-08 ENCOUNTER — Other Ambulatory Visit: Payer: Self-pay | Admitting: *Deleted

## 2021-12-08 MED ORDER — BENAZEPRIL-HYDROCHLOROTHIAZIDE 20-25 MG PO TABS: ORAL_TABLET | ORAL | 0 refills | Status: DC

## 2022-01-25 ENCOUNTER — Other Ambulatory Visit: Payer: Self-pay | Admitting: Cardiology

## 2022-02-10 DIAGNOSIS — G4733 Obstructive sleep apnea (adult) (pediatric): Secondary | ICD-10-CM | POA: Diagnosis not present

## 2022-02-11 DIAGNOSIS — H5021 Vertical strabismus, right eye: Secondary | ICD-10-CM | POA: Diagnosis not present

## 2022-02-11 DIAGNOSIS — H532 Diplopia: Secondary | ICD-10-CM | POA: Diagnosis not present

## 2022-02-26 ENCOUNTER — Other Ambulatory Visit: Payer: Self-pay | Admitting: Cardiology

## 2022-03-07 ENCOUNTER — Other Ambulatory Visit: Payer: Self-pay | Admitting: Cardiology

## 2022-03-16 ENCOUNTER — Other Ambulatory Visit: Payer: Self-pay | Admitting: Cardiology

## 2022-03-19 ENCOUNTER — Encounter: Payer: Self-pay | Admitting: Physician Assistant

## 2022-03-19 ENCOUNTER — Ambulatory Visit: Payer: Medicare HMO | Admitting: Physician Assistant

## 2022-03-19 VITALS — BP 126/72 | HR 75 | Ht 71.0 in | Wt 240.0 lb

## 2022-03-19 DIAGNOSIS — R0609 Other forms of dyspnea: Secondary | ICD-10-CM

## 2022-03-19 DIAGNOSIS — E7800 Pure hypercholesterolemia, unspecified: Secondary | ICD-10-CM | POA: Diagnosis not present

## 2022-03-19 DIAGNOSIS — I2510 Atherosclerotic heart disease of native coronary artery without angina pectoris: Secondary | ICD-10-CM

## 2022-03-19 DIAGNOSIS — I10 Essential (primary) hypertension: Secondary | ICD-10-CM | POA: Diagnosis not present

## 2022-03-19 NOTE — Progress Notes (Signed)
Cardiology Office Note:    Date:  03/19/2022   ID:  Kyle [name], DOB [birth year], MRN [id number]  PCP:  Eulas Post, MD  [facility] Cardiologist:  Candee Furbish, MD  [facility] Electrophysiologist:  None   Chief Complaint: yearly follow up   History of Present Illness:    Kyle [name] is a 76 y.o. male with a hx of CAD s/p NSTEMI in 2011 s/p RCA stent, COPD, HTN, HLD, abdominal aortic aneurysm (followed by vascular) and GERD seen for follow up.   Last cath 05/2016 - Patent RCA stent. Non obstructive dz.  Last echo 01/2021: LVEF 50-55% and garde 1 DD  Patient is here for follow-up.  Patient reports dyspnea on exertion which is ongoing for years.  Slightly worsened.  No exertional chest pain or pressure.  Never needed to sit down.  No regular exercise.  Reports shortness of breath with bending down.  No abdominal pain or pressure.  No orthopnea, PND, syncope, lower extremity edema or melena.  Past Medical History:  Diagnosis Date   Anemia    CAD (coronary artery disease) 08/03/2009   Cardiac arrhythmia    COPD (chronic obstructive pulmonary disease) (HCC)    GERD (gastroesophageal reflux disease)    Hyperlipidemia    Hypertension    Hypothyroidism    Myocardial infarction ([place]) 08/03/2009   Sleep apnea treated with nocturnal BiPAP     Past Surgical History:  Procedure Laterality Date   APPENDECTOMY     CARDIAC CATHETERIZATION N/A 05/05/2016   Procedure: Left Heart Cath and Coronary Angiography;  Surgeon: Jerline Pain, MD;  Location: [facility];  Service: Cardiovascular;  Laterality: N/A;   CORONARY ANGIOPLASTY WITH STENT PLACEMENT     EYE SURGERY     lasix /BIL   TONSILLECTOMY      Current Medications: Current Meds  Medication Sig   albuterol (VENTOLIN HFA) 108 (90 Base) MCG/ACT inhaler Inhale 2 puffs into the lungs every 6 (six) hours as needed for wheezing or shortness of breath.   aspirin 81 MG tablet Take 81 mg by mouth daily.    atorvastatin (LIPITOR) 40 MG tablet TAKE ONE TABLET BY MOUTH EVERY EVENING AT 6PM   benazepril-hydrochlorthiazide (LOTENSIN HCT) 20-25 MG tablet TAKE ONE-HALF TABLET BY MOUTH ONE TIME DAILY **DUE FOR AN APPOINTMENT. PLEASE CALL TO SCHEDULE**   cetirizine (ZYRTEC) 10 MG tablet Take 10 mg by mouth daily.   Cyanocobalamin (B-12 PO) Take 1 tablet by mouth daily.   ferrous sulfate 325 (65 FE) MG tablet Take 1 tablet (325 mg total) by mouth daily with breakfast.   Fluticasone-Umeclidin-Vilant (TRELEGY ELLIPTA) 100-62.5-25 MCG/ACT AEPB Inhale 1 puff into the lungs daily.   Multiple Vitamin (MULTIVITAMIN) tablet Take 1 tablet by mouth daily.   valACYclovir (VALTREX) 1000 MG tablet 1 g.     Allergies:   Patient has no known allergies.   Social History   Socioeconomic History   Marital status: Married    Spouse name: Not on file   Number of children: 0   Years of education: Not on file   Highest education level: 12th grade  Occupational History   Occupation: retired  Tobacco Use   Smoking status: Former    Packs/day: 4.00    Years: 28.00    Total pack years: 112.00    Types: Cigarettes    Quit date: 12/10/1993    Years since quitting: 28.2   Smokeless tobacco: Never  Vaping Use   Vaping Use:  Never used  Substance and Sexual Activity   Alcohol use: Yes    Alcohol/week: 5.0 standard drinks of alcohol    Types: 5 Standard drinks or equivalent per week    Comment: once every 2 months   Drug use: No   Sexual activity: Not on file  Other Topics Concern   Not on file  Social History Narrative   Not on file   Social Determinants of Health   Financial Resource Strain: Low Risk  (10/13/2021)   Overall Financial Resource Strain (CARDIA)    Difficulty of Paying Living Expenses: Not hard at all  Food Insecurity: No Food Insecurity (10/13/2021)   Hunger Vital Sign    Worried About Running Out of Food in the Last Year: Never true    Ran Out of Food in the Last Year: Never true   Transportation Needs: No Transportation Needs (10/13/2021)   PRAPARE - Hydrologist (Medical): No    Lack of Transportation (Non-Medical): No  Physical Activity: Sufficiently Active (10/13/2021)   Exercise Vital Sign    Days of Exercise per Week: 3 days    Minutes of Exercise per Session: 90 min  Recent Concern: Physical Activity - Inactive (09/19/2021)   Exercise Vital Sign    Days of Exercise per Week: 0 days    Minutes of Exercise per Session: 30 min  Stress: No Stress Concern Present (10/13/2021)   [place]    Feeling of Stress : Not at all  Social Connections: [place] (10/13/2021)   Social Connection and Isolation Panel [NHANES]    Frequency of Communication with Friends and Family: More than three times a week    Frequency of Social Gatherings with Friends and Family: More than three times a week    Attends Religious Services: More than 4 times per year    Active Member of [employer] or Organizations: Yes    Attends Music therapist: More than 4 times per year    Marital Status: Married     Family History: The patient's family history includes Hypertension in his mother; Hypotension in his father; Multiple sclerosis in his sister; Stroke in his father and mother; Thyroid disease in his brother.    ROS:   Please see the history of present illness.    All other systems reviewed and are negative.   EKGs/Labs/Other Studies Reviewed:    The following studies were reviewed today:  Echo 01/2021 1. Left ventricular ejection fraction, by estimation, is 50 to 55%. The  left ventricle has low normal function. The left ventricle has no regional  wall motion abnormalities. Left ventricular diastolic parameters are  consistent with Grade I diastolic  dysfunction (impaired relaxation).   2. Right ventricular systolic function is normal. The right ventricular  size is  normal. Tricuspid regurgitation signal is inadequate for assessing  PA pressure.   3. The mitral valve is normal in structure. No evidence of mitral valve  regurgitation. No evidence of mitral stenosis.   4. The aortic valve is grossly normal. Aortic valve regurgitation is not  visualized. No aortic stenosis is present.   5. The inferior vena cava is normal in size with greater than 50%  respiratory variability, suggesting right atrial pressure of 3 mmHg.   Comparison(s): No prior Echocardiogram.   Left Heart Cath and Coronary Angiography  05/2016   Conclusion The left ventricular systolic function is normal. LV end diastolic pressure is normal.  There is no aortic valve stenosis. There is no mitral valve regurgitation. Previously placed right coronary artery mid stent widely patent. 30-40% distal RCA stenosis Minor luminal irregularities in left system.   Reassuring. Nonobstructive coronary artery disease. Patent right coronary stent. Continue with medical management. Candee Furbish, MD    EKG:  EKG is  ordered today.  The ekg ordered today demonstrates normal sinus rhythm  Recent Labs: 09/22/2021: TSH 5.53  Recent Lipid Panel    Component Value Date/Time   CHOL 103 08/09/2020 1447   TRIG 184.0 (H) 08/09/2020 1447   HDL 23.30 (L) 08/09/2020 1447   CHOLHDL 4 08/09/2020 1447   VLDL 36.8 08/09/2020 1447   LDLCALC 43 08/09/2020 1447    Physical Exam:    VS:  BP 126/72   Pulse 75   Ht '5\' 11"'$  (1.803 m)   Wt 240 lb (108.9 kg)   SpO2 93%   BMI 33.47 kg/m     Wt Readings from Last 3 Encounters:  03/19/22 240 lb (108.9 kg)  10/13/21 239 lb (108.4 kg)  09/22/21 239 lb (108.4 kg)     GEN:  Well nourished, well developed in no acute distress HEENT: Normal NECK: No JVD; No carotid bruits LYMPHATICS: No lymphadenopathy CARDIAC: RRR, no murmurs, rubs, gallops RESPIRATORY:  Clear to auscultation without rales, wheezing or rhonchi  ABDOMEN: Soft, non-tender,  non-distended MUSCULOSKELETAL:  No edema; No deformity  SKIN: Warm and dry NEUROLOGIC:  Alert and oriented x 3 PSYCHIATRIC:  Normal affect   ASSESSMENT AND PLAN:    Dyspnea on exertion This is chronic.  However, slightly worse in past 1 year.  He attributes this to overweight and COPD.  He never had exertional chest pain or pressure.  Discussed evaluation by stress test however patient declined.  EKG reassuring.  He will let us know if worsening symptoms.  Discussed nitro use and alarming symptoms.  2.  CAD s/p RCA stent Patent stent by cath in 2017.  As above.  Continue aspirin and statin.  3.  AAA -Will update study.  -He will have kidney function checked by PCP prior to CT.  4.  Hyperlipidemia -Continue statin  Patient is due for lipid panel and other labs.  States he has follow-up with PCP in few weeks where he is scheduled for lab work.  Declined today.  He will send [place] lab for review.  Needs to have renal function checked prior to CT.  Medication Adjustments/Labs and Tests Ordered: Current medicines are reviewed at length with the patient today.  Concerns regarding medicines are outlined above.  Orders Placed This Encounter  Procedures   CT Angio Abd/Pel w/ and/or w/o   EKG 12-Lead   No orders of the defined types were placed in this encounter.   Patient Instructions  Medication Instructions:  Your physician recommends that you continue on your current medications as directed. Please refer to the Current Medication list given to you today.   *If you need a refill on your cardiac medications before your next appointment, please call your pharmacy*   Lab Work: Please have your PCP check your kidney function prior to your CT scan and fax [place] the results.  If you have labs (blood work) drawn today and your tests are completely normal, you will receive your results only by: [place] (if you have MyChart) OR A paper copy in the mail If you have any lab test that  is abnormal or we need to change your treatment, we will  call you to review the results.   Testing/Procedures: Your provider has recommended that your have a CTA scan.   Follow-Up: At [facility], you and your health needs are our priority.  As part of our continuing mission to provide you with exceptional heart care, we have created designated Provider Care Teams.  These Care Teams include your primary Cardiologist (physician) and Advanced Practice Providers (APPs -  Physician Assistants and Nurse Practitioners) who all work together to provide you with the care you need, when you need it.  Your next appointment:   6 month(s)  The format for your next appointment:   In Person  Provider:   Candee Furbish, MD or APP   Important Information About Sugar         Jarrett Soho, [place]  03/19/2022 8:41 AM    [place]

## 2022-03-19 NOTE — Patient Instructions (Signed)
Medication Instructions:  Your physician recommends that you continue on your current medications as directed. Please refer to the Current Medication list given to you today.   *If you need a refill on your cardiac medications before your next appointment, please call your pharmacy*   Lab Work: Please have your PCP check your kidney function prior to your CT scan and fax [place] the results.  If you have labs (blood work) drawn today and your tests are completely normal, you will receive your results only by: [place] (if you have MyChart) OR A paper copy in the mail If you have any lab test that is abnormal or we need to change your treatment, we will call you to review the results.   Testing/Procedures: Your provider has recommended that your have a CTA scan.   Follow-Up: At [facility], you and your health needs are our priority.  As part of our continuing mission to provide you with exceptional heart care, we have created designated Provider Care Teams.  These Care Teams include your primary Cardiologist (physician) and Advanced Practice Providers (APPs -  Physician Assistants and Nurse Practitioners) who all work together to provide you with the care you need, when you need it.  Your next appointment:   6 month(s)  The format for your next appointment:   In Person  Provider:   Candee Furbish, MD or APP   Important Information About Sugar

## 2022-03-24 ENCOUNTER — Other Ambulatory Visit (INDEPENDENT_AMBULATORY_CARE_PROVIDER_SITE_OTHER): Payer: Medicare HMO

## 2022-03-24 ENCOUNTER — Other Ambulatory Visit: Payer: Self-pay

## 2022-03-24 DIAGNOSIS — E039 Hypothyroidism, unspecified: Secondary | ICD-10-CM

## 2022-03-24 LAB — TSH: TSH: 8.39 u[IU]/mL — ABNORMAL HIGH (ref 0.35–5.50)

## 2022-03-24 MED ORDER — LEVOTHYROXINE SODIUM 25 MCG PO TABS: 25.0000 ug | ORAL_TABLET | Freq: Every day | ORAL | 1 refills | Status: DC

## 2022-04-02 ENCOUNTER — Ambulatory Visit (HOSPITAL_BASED_OUTPATIENT_CLINIC_OR_DEPARTMENT_OTHER)
Admission: RE | Admit: 2022-04-02 | Discharge: 2022-04-02 | Disposition: A | Discharge status: Home / Self Care | Payer: Medicare HMO | Source: Ambulatory Visit | Attending: Physician Assistant | Admitting: Physician Assistant

## 2022-04-02 ENCOUNTER — Encounter (HOSPITAL_BASED_OUTPATIENT_CLINIC_OR_DEPARTMENT_OTHER): Payer: Self-pay

## 2022-04-02 DIAGNOSIS — I700 Atherosclerosis of aorta: Secondary | ICD-10-CM | POA: Diagnosis not present

## 2022-04-02 DIAGNOSIS — E7800 Pure hypercholesterolemia, unspecified: Secondary | ICD-10-CM | POA: Diagnosis not present

## 2022-04-02 DIAGNOSIS — I10 Essential (primary) hypertension: Secondary | ICD-10-CM | POA: Diagnosis not present

## 2022-04-02 DIAGNOSIS — I2510 Atherosclerotic heart disease of native coronary artery without angina pectoris: Secondary | ICD-10-CM | POA: Diagnosis not present

## 2022-04-02 MED ORDER — IOHEXOL 350 MG/ML SOLN
125.0000 mL | Freq: Once | INTRAVENOUS | Status: AC | PRN
  Administered 2022-04-02: 125 mL via INTRAVENOUS

## 2022-04-15 DIAGNOSIS — H5021 Vertical strabismus, right eye: Secondary | ICD-10-CM | POA: Diagnosis not present

## 2022-04-25 ENCOUNTER — Other Ambulatory Visit: Payer: Self-pay | Admitting: Cardiology

## 2022-04-25 ENCOUNTER — Other Ambulatory Visit: Payer: Self-pay | Admitting: Gastroenterology

## 2022-04-29 ENCOUNTER — Ambulatory Visit (INDEPENDENT_AMBULATORY_CARE_PROVIDER_SITE_OTHER): Payer: Medicare HMO

## 2022-04-29 DIAGNOSIS — Z23 Encounter for immunization: Secondary | ICD-10-CM

## 2022-05-23 NOTE — Progress Notes (Unsigned)
Subjective:    Patient ID: Kyle [name], male    DOB: 1946/06/09, 76 y.o.   MRN: [id number]  HPI   male former smoker followed for OSA, COPD GOLD III, Rhinitis Complicated by GERD, CAD/MI, HBP Unattended Home Sleep Test-01/05/2014, moderate OSA, AHI 19/hour, weight 223 pounds BIPAP titration 03/18/18- Office Spirometry 09/10/2016-severe obstructive airways disease: FVC 2.83/61%, FEV1 1.17/34%, ratio 0.41, FEF 25-75% 0.35/13% PFT 09/22/16-severe COPD, no response to dilator, diffusion moderately reduced, air trapping, hyperinflation. FVC 3.54/75%, FEV1 1.7 2/49%, ratio 0.49, TLC 123%, DLCO 56% O2 Qualifying Walk Test 01/17/21- 3 laps x 250 ft, lowest sat 94%, max HR 83. Nurse reports "good steady pace, able to talk as well". -----------------------------------------------------------------   05/26/21- 76 year old male former smoker followed for OSA, COPD, rhinitis complicated by GERD, CAD/MI, HBP, Covid infection/ Paxlovid May 2022,  BIPAP 15/11/ PS 3 [employer] -Trelegy 100, albuterol hfa Covid vax- [employer] Body weight today-239 lbs Download- compliance 100%, AHI 4.9/ hr Flu vax- had Doing Pulmonary Rehab for COPD-feels that is some help.  Anticipates return to [employer]. He is comfortable with CPAP.  Download reviewed. CTa chest aorta 01/29/21- Lungs/Pleura: Paraseptal and centrilobular emphysema. No confluent airspace disease. Mild reticular opacities in the dependent aspects of the lungs compatible with atelectasis. Mild linear scarring/architectural distortion of the right middle lobe, bilateral lower lobes.  No suspicious nodules. No pneumothorax or pleural effusion  05/25/22- 76 year old male former smoker followed for OSA, COPD, rhinitis complicated by GERD, CAD/MI, HBP, Covid infection/ Paxlovid May 2022,  BIPAP 15/11/ PS 3 [employer] -Trelegy 100, albuterol hfa Covid vax- [employer] Body weight today- Download- compliance  Flu vax-   ROS-see HPI   + =  positive Constitutional:    weight loss, night sweats, fevers, chills, fatigue, lassitude. HEENT:    headaches, difficulty swallowing, tooth/dental problems, sore throat,       sneezing, itching, ear ache, nasal congestion, post nasal drip, snoring CV:    chest pain, orthopnea, PND, swelling in lower extremities, anasarca,                                                           dizziness, palpitations Resp:   +shortness of breath with exertion or at rest.                +productive cough,   non-productive cough, coughing up of blood.              change in color of mucus.  +wheezing.   Skin:    rash or lesions. GI:  No-   heartburn, indigestion, abdominal pain, nausea, vomiting, diarrhea,                 change in bowel habits, loss of appetite GU: dysuria, change in color of urine, no urgency or frequency.   flank pain. MS:   joint pain, stiffness, decreased range of motion, back pain. Neuro-     nothing unusual Psych:  change in mood or affect.  depression or anxiety.   memory loss.     Objective:  OBJ- Physical Exam General- Alert, Oriented, Affect-appropriate, Distress- none acute, + overweight Skin- rash-none, lesions- none, excoriation- none Lymphadenopathy- none Head- atraumatic            Eyes- Gross vision intact, PERRLA, conjunctivae and secretions clear  Ears- Hearing, canals-normal            Nose- Clear, no-Septal dev, mucus, polyps, erosion, perforation,              Throat- Mallampati III , mucosa clear , drainage- none, tonsils- atrophic Neck- flexible , trachea midline, no stridor , thyroid nl, carotid no bruit Chest - symmetrical excursion , unlabored           Heart/CV- RRR , no murmur , no gallop  , no rub, nl s1 s2                           - JVD- none , edema- none, stasis changes- none, varices- none           Lung- + unlabored, +Clear/ diminished, wheeze- none, cough- none , dullness-none, rub- none           Chest wall-  Abd-  Br/ Gen/ Rectal-  Not done, not indicated Extrem- cyanosis- none, clubbing, none, atrophy- none, strength- nl Neuro- grossly intact to observation  Assessment & Plan:

## 2022-05-26 ENCOUNTER — Encounter: Payer: Self-pay | Admitting: Internal Medicine

## 2022-05-26 ENCOUNTER — Ambulatory Visit: Payer: Medicare HMO | Admitting: Internal Medicine

## 2022-05-26 DIAGNOSIS — G4733 Obstructive sleep apnea (adult) (pediatric): Secondary | ICD-10-CM

## 2022-05-26 DIAGNOSIS — J449 Chronic obstructive pulmonary disease, unspecified: Secondary | ICD-10-CM | POA: Diagnosis not present

## 2022-05-26 MED ORDER — TRELEGY ELLIPTA 100-62.5-25 MCG/ACT IN AEPB: 1.0000 | INHALATION_SPRAY | Freq: Every day | RESPIRATORY_TRACT | 11 refills | Status: DC

## 2022-05-26 MED ORDER — ALBUTEROL SULFATE HFA 108 (90 BASE) MCG/ACT IN AERS: 2.0000 | INHALATION_SPRAY | Freq: Four times a day (QID) | RESPIRATORY_TRACT | 12 refills | Status: DC | PRN

## 2022-05-26 NOTE — Patient Instructions (Signed)
Order- CXR   dx COPD mixed type  Refills sent for Trelegy and Ventolin  I would suggest you get the new Covid vaccine . The RSV vaccine would be fine, but I just don't have firm advice on that one yet.  We can continue the BIPAP

## 2022-05-27 NOTE — Assessment & Plan Note (Signed)
Benefits from BiPAP, sleeping well.  No concerns. Plan-continue BiPAP 15/11 with PS 3

## 2022-05-27 NOTE — Assessment & Plan Note (Signed)
He feels stable and well-controlled.  Needs inhaler refills and agrees to CXR.  Paces himself. Plan-refill Trelegy and Ventolin, CXR

## 2022-06-05 ENCOUNTER — Other Ambulatory Visit: Payer: Self-pay

## 2022-06-05 DIAGNOSIS — J449 Chronic obstructive pulmonary disease, unspecified: Secondary | ICD-10-CM

## 2022-06-10 ENCOUNTER — Telehealth: Payer: Self-pay | Admitting: Cardiology

## 2022-06-10 NOTE — Telephone Encounter (Signed)
Spoke with pt's wife, DPR who states pt received Covid vaccine this past Friday and on Sunday developed severe CP, SOB and chills.  These symptoms lasted for several minutes and eventually resolved.  Pt is feeling fine today without symptoms.  They do not have a current BP or HR.  Pt's wife is requesting an appointment for further evaluation.  Appointment scheduled for 06/16/2022 at 11am with Dr Marlou Porch.  Reviewed ED precautions and stressed importance of following. Pt's wife verbalizes understanding and agrees with current plan.

## 2022-06-10 NOTE — Telephone Encounter (Signed)
   Pt c/o of Chest Pain: STAT if CP now or developed within 24 hours  1. Are you having CP right now? No   2. Are you experiencing any other symptoms (ex. SOB, nausea, vomiting, sweating)? SOB, chills   3. How long have you been experiencing CP? Sunday night   4. Is your CP continuous or coming and going? continuous for several minutes   5. Have you taken Nitroglycerin? No    Pt's wife said, pt had a very bad chest tightness last Sunday nigh that lasted for several minutes. She said, pt has SOB and chills, they had covid vaccine friday afternoon and wondering if that causes it or his heart issues. They would like to see Dr. Marlou Porch and refused to see APP

## 2022-06-11 ENCOUNTER — Ambulatory Visit (INDEPENDENT_AMBULATORY_CARE_PROVIDER_SITE_OTHER): Payer: Medicare HMO

## 2022-06-11 DIAGNOSIS — J449 Chronic obstructive pulmonary disease, unspecified: Secondary | ICD-10-CM | POA: Diagnosis not present

## 2022-06-16 ENCOUNTER — Encounter: Payer: Self-pay | Admitting: Cardiology

## 2022-06-16 ENCOUNTER — Ambulatory Visit: Payer: Medicare HMO | Attending: Cardiology | Admitting: Cardiology

## 2022-06-16 ENCOUNTER — Telehealth (HOSPITAL_COMMUNITY): Payer: Self-pay | Admitting: *Deleted

## 2022-06-16 VITALS — BP 130/60 | HR 71 | Ht 71.0 in | Wt 238.0 lb

## 2022-06-16 DIAGNOSIS — R079 Chest pain, unspecified: Secondary | ICD-10-CM | POA: Diagnosis not present

## 2022-06-16 DIAGNOSIS — R0609 Other forms of dyspnea: Secondary | ICD-10-CM

## 2022-06-16 DIAGNOSIS — J449 Chronic obstructive pulmonary disease, unspecified: Secondary | ICD-10-CM | POA: Diagnosis not present

## 2022-06-16 DIAGNOSIS — I2510 Atherosclerotic heart disease of native coronary artery without angina pectoris: Secondary | ICD-10-CM | POA: Diagnosis not present

## 2022-06-16 NOTE — Patient Instructions (Signed)
Medication Instructions:  Your physician recommends that you continue on your current medications as directed. Please refer to the Current Medication list given to you today.  *If you need a refill on your cardiac medications before your next appointment, please call your pharmacy*   Lab Work: NONE  If you have labs (blood work) drawn today and your tests are completely normal, you will receive your results only by: [place] (if you have MyChart) OR A paper copy in the mail If you have any lab test that is abnormal or we need to change your treatment, we will call you to review the results.   Testing/Procedures: Your physician has requested that you have an echocardiogram. Echocardiography is a painless test that uses sound waves to create images of your heart. It provides your doctor with information about the size and shape of your heart and how well your heart's chambers and valves are working. This procedure takes approximately one hour. There are no restrictions for this procedure. Please do NOT wear cologne, perfume, aftershave, or lotions (deodorant is allowed). Please arrive 15 minutes prior to your appointment time.   Your physician has requested that you have a lexiscan myoview. For further information please visit [email or web address]. Please follow instruction sheet, as given.    You are scheduled for a Myocardial Perfusion Imaging Study. Please arrive 15 minutes prior to your appointment time for registration and insurance purposes.   The test will take approximately 3 to 4 hours to complete; you may bring reading material.  If someone comes with you to your appointment, they will need to remain in the main lobby due to limited space in the testing area. **If you are pregnant or breastfeeding, please notify the nuclear lab prior to your appointment**   How to prepare for your Myocardial Perfusion Test: Do not eat or drink 3 hours prior to your test, except you may have  water. Do not consume products containing caffeine (regular or decaffeinated) 12 hours prior to your test. (ex: coffee, chocolate, sodas, tea). Do bring a list of your current medications with you.  If not listed below, you may take your medications as normal. Do wear comfortable clothes (no dresses or overalls) and walking shoes, tennis shoes preferred (No heels or open toe shoes are allowed). Do NOT wear cologne, perfume, aftershave, or lotions (deodorant is allowed). If these instructions are not followed, your test will have to be rescheduled.  If you cannot keep your appointment, please provide 24 hours notification to the Nuclear Lab, to avoid a possible $50 charge to your account.       Follow-Up: At [facility] you and your health needs are our priority.  As part of our continuing mission to provide you with exceptional heart care, we have created designated Provider Care Teams.  These Care Teams include your primary Cardiologist (physician) and Advanced Practice Providers (APPs -  Physician Assistants and Nurse Practitioners) who all work together to provide you with the care you need, when you need it.    Your next appointment:   3 month(s)  The format for your next appointment:   In Person  Provider:   Nicholes Rough, PA-C, Ambrose Pancoast, NP, or Christen Bame, NP     Important Information About Sugar

## 2022-06-16 NOTE — Addendum Note (Signed)
Addended by: Precious Gilding on: 06/16/2022 01:02 PM   Modules accepted: Orders

## 2022-06-16 NOTE — Progress Notes (Signed)
Cardiology Office Note:    Date:  06/16/2022   ID:  [name], DOB 06/02/1946, MRN [id number]  PCP:  Eulas Post, MD   [facility] Providers Cardiologist:  Candee Furbish, MD     Referring MD: Eulas Post, MD     History of Present Illness:    Kyle [name] is a 76 y.o. male here for the follow-up of coronary artery disease, prior inferior, RCA ST elevation myocardial infarction in 2011.  Prior cardiac catheterization in 05/05/2016 showed patent stent in the right coronary artery.  Battles with COPD, difficult with mobility.  Plays golf less often.  Enjoys his his wife's cooking he has stated in the past, different soups during the wintertime including "swamp soup "which has collards 3 types of beans and a thin sausage.  At his last visit, he was accompanied by his wife. He reported that he continues to have shortness of breath. He used an inhaler as needed. He did complete physical activity(walking), but no formal exercise. He was currently working towards walking one mile three times per week. He had some exertional shortness of breath but he suspects that this is secondary to his COPD and being out of shape. He noted that COVID-19 had impeded his process of working out. His wife reported that they eat healthy, he will have grilled foods.  Today, he complains of chest pain and shortness of breath with minimal exertion. He reports that a couple weeks ago, a few days after receiving the COVID booster he felt very unwell, had chills, chest pain and shortness of breath. He had chills and was bundled up with heating blankets and sweaters in order to stay warm. He had shortness of breath and chest pain. He did not feel well by late morning so he used his albuterol machine. He reported it initially did not help but after some time it began to work. He afterwards slept for about 13 hours, he usually gets around 6-7 hours of sleep. He eventually felt the heart flutters stop.    Just walking from the kitchen to the living room will have him short of breath.   He denies any peripheral edema. No lightheadedness, headaches, syncope, orthopnea, or PND.   Past Medical History:  Diagnosis Date   Anemia    CAD (coronary artery disease) 08/03/2009   Cardiac arrhythmia    COPD (chronic obstructive pulmonary disease) (HCC)    GERD (gastroesophageal reflux disease)    Hyperlipidemia    Hypertension    Hypothyroidism    Myocardial infarction ([place]) 08/03/2009   Sleep apnea treated with nocturnal BiPAP     Past Surgical History:  Procedure Laterality Date   APPENDECTOMY     CARDIAC CATHETERIZATION N/A 05/05/2016   Procedure: Left Heart Cath and Coronary Angiography;  Surgeon: Jerline Pain, MD;  Location: [facility];  Service: Cardiovascular;  Laterality: N/A;   CORONARY ANGIOPLASTY WITH STENT PLACEMENT     EYE SURGERY     lasix /BIL   TONSILLECTOMY      Current Medications: Current Meds  Medication Sig   albuterol (VENTOLIN HFA) 108 (90 Base) MCG/ACT inhaler Inhale 2 puffs into the lungs every 6 (six) hours as needed for wheezing or shortness of breath.   aspirin 81 MG tablet Take 81 mg by mouth daily.   atorvastatin (LIPITOR) 40 MG tablet TAKE ONE TABLET BY MOUTH EVERY EVENING AT 6P.M.   benazepril-hydrochlorthiazide (LOTENSIN HCT) 20-25 MG tablet TAKE ONE-HALF TABLET BY MOUTH  ONE TIME DAILY **DUE FOR AN APPOINTMENT. PLEASE CALL TO SCHEDULE**   cetirizine (ZYRTEC) 10 MG tablet Take 10 mg by mouth daily.   Cyanocobalamin (B-12 PO) Take 1 tablet by mouth daily.   FEROSUL 325 (65 Fe) MG tablet TAKE ONE TABLET BY MOUTH ONE TIME DAILY WITH BREAKFAST   Fluticasone-Umeclidin-Vilant (TRELEGY ELLIPTA) 100-62.5-25 MCG/ACT AEPB Inhale 1 puff into the lungs daily.   levothyroxine (SYNTHROID) 25 MCG tablet Take 1 tablet (25 mcg total) by mouth daily.   Multiple Vitamin (MULTIVITAMIN) tablet Take 1 tablet by mouth daily.   valACYclovir (VALTREX) 1000 MG tablet 1  g.     Allergies:   Patient has no known allergies.   Social History   Socioeconomic History   Marital status: Married    Spouse name: Not on file   Number of children: 0   Years of education: Not on file   Highest education level: 12th grade  Occupational History   Occupation: retired  Tobacco Use   Smoking status: Former    Packs/day: 4.00    Years: 28.00    Total pack years: 112.00    Types: Cigarettes    Quit date: 12/10/1993    Years since quitting: 28.5   Smokeless tobacco: Never  Vaping Use   Vaping Use: Never used  Substance and Sexual Activity   Alcohol use: Yes    Alcohol/week: 5.0 standard drinks of alcohol    Types: 5 Standard drinks or equivalent per week    Comment: once every 2 months   Drug use: No   Sexual activity: Not on file  Other Topics Concern   Not on file  Social History Narrative   Not on file   Social Determinants of Health   Financial Resource Strain: Low Risk  (10/13/2021)   Overall Financial Resource Strain (CARDIA)    Difficulty of Paying Living Expenses: Not hard at all  Food Insecurity: No Food Insecurity (10/13/2021)   Hunger Vital Sign    Worried About Running Out of Food in the Last Year: Never true    Ran Out of Food in the Last Year: Never true  Transportation Needs: No Transportation Needs (10/13/2021)   PRAPARE - Hydrologist (Medical): No    Lack of Transportation (Non-Medical): No  Physical Activity: Sufficiently Active (10/13/2021)   Exercise Vital Sign    Days of Exercise per Week: 3 days    Minutes of Exercise per Session: 90 min  Recent Concern: Physical Activity - Inactive (09/19/2021)   Exercise Vital Sign    Days of Exercise per Week: 0 days    Minutes of Exercise per Session: 30 min  Stress: No Stress Concern Present (10/13/2021)   [place]    Feeling of Stress : Not at all  Social Connections: [place]  (10/13/2021)   Social Connection and Isolation Panel [NHANES]    Frequency of Communication with Friends and Family: More than three times a week    Frequency of Social Gatherings with Friends and Family: More than three times a week    Attends Religious Services: More than 4 times per year    Active Member of [employer] or Organizations: Yes    Attends Music therapist: More than 4 times per year    Marital Status: Married     Family History: The patient's family history includes Hypertension in his mother; Hypotension in his father; Multiple sclerosis in his sister;  Stroke in his father and mother; Thyroid disease in his brother.  ROS:   Please see the history of present illness.  (+) Chest pain  (+) Palpitations / "Heart flutters" (+) Shortness of breath  All other systems reviewed and are negative.  EKGs/Labs/Other Studies Reviewed:    The following studies were reviewed today:  Chest X-ray 06/11/2022: FINDINGS: Stable cardiomediastinal silhouette with normal heart size. No pneumothorax. No pleural effusion. Hyperinflated lungs. No pulmonary edema. No acute consolidative airspace disease. Scattered minimal reticular opacities at the right lung base, similar.   IMPRESSION: 1. No acute cardiopulmonary disease. 2. Hyperinflated lungs, compatible with reported history of COPD. 3. Scattered reticular opacities at the right lung base, similar, favor mild scarring.   Echo 01/07/2021: IMPRESSIONS   1. Left ventricular ejection fraction, by estimation, is 50 to 55%. The  left ventricle has low normal function. The left ventricle has no regional  wall motion abnormalities. Left ventricular diastolic parameters are  consistent with Grade I diastolic  dysfunction (impaired relaxation).   2. Right ventricular systolic function is normal. The right ventricular  size is normal. Tricuspid regurgitation signal is inadequate for assessing  PA pressure.   3. The mitral valve is  normal in structure. No evidence of mitral valve  regurgitation. No evidence of mitral stenosis.   4. The aortic valve is grossly normal. Aortic valve regurgitation is not  visualized. No aortic stenosis is present.   5. The inferior vena cava is normal in size with greater than 50%  respiratory variability, suggesting right atrial pressure of 3 mmHg.    05/05/16 LEFT HEART CATH AND CORONARY ANGIO- The left ventricular systolic function is normal. LV end diastolic pressure is normal. There is no aortic valve stenosis. There is no mitral valve regurgitation. Previously placed right coronary artery mid stent widely patent. 30-40% distal RCA stenosis Minor luminal irregularities in left system.   EKG:  EKG is personally reviewed an interpreted.  06/16/2022: Sinus rhythm 71 bpm with ventricular bigeminy pattern.  03/19/2022: Normal sinus rhythm. Rate 69 bpm. 12/09/2020: Sinus bradycardia. Rate:55  Recent Labs: 03/24/2022: TSH 8.39  Recent Lipid Panel    Component Value Date/Time   CHOL 103 08/09/2020 1447   TRIG 184.0 (H) 08/09/2020 1447   HDL 23.30 (L) 08/09/2020 1447   CHOLHDL 4 08/09/2020 1447   VLDL 36.8 08/09/2020 1447   LDLCALC 43 08/09/2020 1447     Risk Assessment/Calculations:      Physical Exam:    VS:  BP 130/60 (BP Location: Left Arm, Patient Position: Sitting, Cuff Size: Normal)   Pulse 71   Ht '5\' 11"'$  (1.803 m)   Wt 238 lb (108 kg)   BMI 33.19 kg/m     Wt Readings from Last 3 Encounters:  06/16/22 238 lb (108 kg)  05/26/22 240 lb 3.2 oz (109 kg)  03/19/22 240 lb (108.9 kg)     GEN: Well nourished, well developed in no acute distress HEENT: Normal NECK: No JVD; No carotid bruits LYMPHATICS: No lymphadenopathy CARDIAC:  RRR, no murmurs, rubs, gallops RESPIRATORY:  Clear to auscultation without rales, wheezing or rhonchi  ABDOMEN: Soft, non-tender, non-distended MUSCULOSKELETAL:  No edema; No deformity  SKIN: Warm and dry NEUROLOGIC:  Alert and  oriented x 3 PSYCHIATRIC:  Normal affect   ASSESSMENT:    1. Coronary artery disease involving native coronary artery of native heart without angina pectoris   2. DOE (dyspnea on exertion)   3. COPD mixed type ([place])   4. Chest  pain of uncertain etiology     PLAN:    In order of problems listed above:  Chest discomfort/shortness of breath - He states that this all happened after he got his COVID booster.  Felt rough, poor, centralized chest discomfort.  Weak tired heating blanket over feet.  He did not go to the hospital.  He was concerned that he might not make it.  Based upon the symptoms, we will go ahead and proceed with nuclear stress test as well as echocardiogram.  Prior right coronary artery stent.  Look for any signs of ischemia.  Of course some of his shortness of breath may be associated with his underlying COPD. -Interestingly ventricular bigeminy was present on ECG.    Coronary artery disease - Cardiac catheterization 2017 showed patent right coronary artery stent.  No changes made.  Continue with goal-directed therapy, aspirin, high intensity statin.  Checking stress test as above.  Essential hypertension - Occasionally elevated mildly but overall well controlled.  Medications reviewed no changes made.  Hyperlipidemia - On atorvastatin 40 mg.  LDL previously 56, no myalgias.  Current LDL 43.  Excellent.  Doing well on goal-directed medical therapy.  COPD/obstructive sleep apnea - Has seen Dr. Annamaria Boots in the past with pulmonary.  Continuing to monitor activity. -Encouraged for continued exercise.  Conditioning efforts.  Weight loss.  Encouraged him to ask questions to Dr. Annamaria Boots about treatment strategies.  He also sees the [place] [facility].  Previously we talked about  trying to get back to the gym and walks the track.  Excellent.  This is challenging for him however.  1 year follow-up  Medication Adjustments/Labs and Tests Ordered: Current medicines are reviewed at length  with the patient today.  Concerns regarding medicines are outlined above.  Orders Placed This Encounter  Procedures   MYOCARDIAL PERFUSION IMAGING   EKG 12-Lead   ECHOCARDIOGRAM COMPLETE   No orders of the defined types were placed in this encounter.  Patient Instructions  Medication Instructions:  Your physician recommends that you continue on your current medications as directed. Please refer to the Current Medication list given to you today.  *If you need a refill on your cardiac medications before your next appointment, please call your pharmacy*   Lab Work: NONE  If you have labs (blood work) drawn today and your tests are completely normal, you will receive your results only by: [place] (if you have MyChart) OR A paper copy in the mail If you have any lab test that is abnormal or we need to change your treatment, we will call you to review the results.   Testing/Procedures: Your physician has requested that you have an echocardiogram. Echocardiography is a painless test that uses sound waves to create images of your heart. It provides your doctor with information about the size and shape of your heart and how well your heart's chambers and valves are working. This procedure takes approximately one hour. There are no restrictions for this procedure. Please do NOT wear cologne, perfume, aftershave, or lotions (deodorant is allowed). Please arrive 15 minutes prior to your appointment time.   Your physician has requested that you have a lexiscan myoview. For further information please visit [email or web address]. Please follow instruction sheet, as given.    You are scheduled for a Myocardial Perfusion Imaging Study. Please arrive 15 minutes prior to your appointment time for registration and insurance purposes.   The test will take approximately 3 to 4 hours to complete; you may bring reading  material.  If someone comes with you to your appointment, they will need to  remain in the main lobby due to limited space in the testing area. **If you are pregnant or breastfeeding, please notify the nuclear lab prior to your appointment**   How to prepare for your Myocardial Perfusion Test: Do not eat or drink 3 hours prior to your test, except you may have water. Do not consume products containing caffeine (regular or decaffeinated) 12 hours prior to your test. (ex: coffee, chocolate, sodas, tea). Do bring a list of your current medications with you.  If not listed below, you may take your medications as normal. Do wear comfortable clothes (no dresses or overalls) and walking shoes, tennis shoes preferred (No heels or open toe shoes are allowed). Do NOT wear cologne, perfume, aftershave, or lotions (deodorant is allowed). If these instructions are not followed, your test will have to be rescheduled.  If you cannot keep your appointment, please provide 24 hours notification to the Nuclear Lab, to avoid a possible $50 charge to your account.       Follow-Up: At [facility], you and your health needs are our priority.  As part of our continuing mission to provide you with exceptional heart care, we have created designated Provider Care Teams.  These Care Teams include your primary Cardiologist (physician) and Advanced Practice Providers (APPs -  Physician Assistants and Nurse Practitioners) who all work together to provide you with the care you need, when you need it.    Your next appointment:   3 month(s)  The format for your next appointment:   In Person  Provider:   Nicholes Rough, PA-C, Ambrose Pancoast, NP, or Christen Bame, NP     Important Information About Sugar         I,Rachel Rivera,acting as a scribe for Candee Furbish, MD.,have documented all relevant documentation on the behalf of Candee Furbish, MD,as directed by  Candee Furbish, MD while in the presence of Candee Furbish, MD.  I, Candee Furbish, MD, have reviewed all documentation for this visit.  The documentation on 06/16/22 for the exam, diagnosis, procedures, and orders are all accurate and complete.   Signed, Candee Furbish, MD  06/16/2022 12:26 PM    [place]

## 2022-06-16 NOTE — Telephone Encounter (Signed)
Reached pt on cell phone and gave instructions for MPI study on 06/18/22.

## 2022-06-16 NOTE — Addendum Note (Signed)
Addended by: Candee Furbish C on: 06/16/2022 01:31 PM   Modules accepted: Orders

## 2022-06-18 ENCOUNTER — Ambulatory Visit (HOSPITAL_BASED_OUTPATIENT_CLINIC_OR_DEPARTMENT_OTHER): Payer: Medicare HMO

## 2022-06-18 ENCOUNTER — Ambulatory Visit (HOSPITAL_COMMUNITY): Payer: Medicare HMO | Attending: Internal Medicine

## 2022-06-18 DIAGNOSIS — R079 Chest pain, unspecified: Secondary | ICD-10-CM

## 2022-06-18 DIAGNOSIS — I2510 Atherosclerotic heart disease of native coronary artery without angina pectoris: Secondary | ICD-10-CM

## 2022-06-18 DIAGNOSIS — R0609 Other forms of dyspnea: Secondary | ICD-10-CM | POA: Diagnosis not present

## 2022-06-18 LAB — MYOCARDIAL PERFUSION IMAGING
LV dias vol: 115 mL (ref 62–150)
LV sys vol: 50 mL
Nuc Stress EF: 56 %
Peak HR: 80 {beats}/min
Rest HR: 63 {beats}/min
Rest Nuclear Isotope Dose: 10.9 mCi
SDS: 0
SRS: 0
SSS: 0
ST Depression (mm): 0 mm
Stress Nuclear Isotope Dose: 31 mCi
TID: 1.08

## 2022-06-18 LAB — ECHOCARDIOGRAM COMPLETE
Area-P 1/2: 3.31 cm2
Height: 71 in
S' Lateral: 4 cm
Weight: 3808 oz

## 2022-06-18 MED ORDER — REGADENOSON 0.4 MG/5ML IV SOLN
0.4000 mg | Freq: Once | INTRAVENOUS | Status: AC
  Administered 2022-06-18: 0.4 mg via INTRAVENOUS

## 2022-06-18 MED ORDER — TECHNETIUM TC 99M TETROFOSMIN IV KIT
31.0000 | PACK | Freq: Once | INTRAVENOUS | Status: AC | PRN
  Administered 2022-06-18: 31 via INTRAVENOUS

## 2022-06-18 MED ORDER — TECHNETIUM TC 99M TETROFOSMIN IV KIT
10.9000 | PACK | Freq: Once | INTRAVENOUS | Status: AC | PRN
  Administered 2022-06-18: 10.9 via INTRAVENOUS

## 2022-06-18 MED ORDER — PERFLUTREN LIPID MICROSPHERE
1.0000 mL | INTRAVENOUS | Status: AC | PRN
  Administered 2022-06-18: 2 mL via INTRAVENOUS

## 2022-06-19 ENCOUNTER — Telehealth: Payer: Self-pay | Admitting: Cardiology

## 2022-06-19 NOTE — Telephone Encounter (Signed)
Patient's wife is requesting a call back to discuss echo and stress test results.

## 2022-06-19 NOTE — Telephone Encounter (Signed)
Spoke with the patient's wife and advised that results had not been reviewed by Dr. Marlou Porch. Advised that we would give them a call once he reviews and provides any recommendations. She verbalized understanding.

## 2022-06-22 ENCOUNTER — Other Ambulatory Visit: Payer: Self-pay

## 2022-06-22 DIAGNOSIS — Z961 Presence of intraocular lens: Secondary | ICD-10-CM | POA: Diagnosis not present

## 2022-06-22 DIAGNOSIS — H532 Diplopia: Secondary | ICD-10-CM | POA: Diagnosis not present

## 2022-06-22 DIAGNOSIS — H17829 Peripheral opacity of cornea, unspecified eye: Secondary | ICD-10-CM | POA: Diagnosis not present

## 2022-06-22 MED ORDER — BENAZEPRIL-HYDROCHLOROTHIAZIDE 20-25 MG PO TABS: ORAL_TABLET | ORAL | 3 refills | Status: DC

## 2022-06-22 NOTE — Telephone Encounter (Signed)
Pt's medication was sent to pt's pharmacy as requested. Confirmation received.  °

## 2022-07-01 ENCOUNTER — Other Ambulatory Visit (INDEPENDENT_AMBULATORY_CARE_PROVIDER_SITE_OTHER): Payer: Medicare HMO

## 2022-07-01 DIAGNOSIS — E039 Hypothyroidism, unspecified: Secondary | ICD-10-CM | POA: Diagnosis not present

## 2022-07-01 LAB — TSH: TSH: 6.77 u[IU]/mL — ABNORMAL HIGH (ref 0.35–5.50)

## 2022-07-03 ENCOUNTER — Other Ambulatory Visit: Payer: Self-pay

## 2022-07-03 DIAGNOSIS — E039 Hypothyroidism, unspecified: Secondary | ICD-10-CM

## 2022-07-03 MED ORDER — LEVOTHYROXINE SODIUM 50 MCG PO TABS: 50.0000 ug | ORAL_TABLET | Freq: Every day | ORAL | 1 refills | Status: DC

## 2022-09-07 ENCOUNTER — Ambulatory Visit: Payer: Medicare HMO | Attending: Cardiology | Admitting: Cardiology

## 2022-09-07 ENCOUNTER — Encounter: Payer: Self-pay | Admitting: Cardiology

## 2022-09-07 VITALS — BP 146/70 | HR 73 | Ht 71.0 in | Wt 241.0 lb

## 2022-09-07 DIAGNOSIS — I2510 Atherosclerotic heart disease of native coronary artery without angina pectoris: Secondary | ICD-10-CM

## 2022-09-07 DIAGNOSIS — R0609 Other forms of dyspnea: Secondary | ICD-10-CM

## 2022-09-07 DIAGNOSIS — E785 Hyperlipidemia, unspecified: Secondary | ICD-10-CM

## 2022-09-07 DIAGNOSIS — E7800 Pure hypercholesterolemia, unspecified: Secondary | ICD-10-CM | POA: Diagnosis not present

## 2022-09-07 DIAGNOSIS — J449 Chronic obstructive pulmonary disease, unspecified: Secondary | ICD-10-CM | POA: Diagnosis not present

## 2022-09-07 NOTE — Progress Notes (Signed)
Cardiology Office Note:    Date:  09/07/2022   ID:  Kyle [name], DOB October 01, 1945, MRN [id number]  PCP:  Eulas Post, MD   [facility] Providers Cardiologist:  Candee Furbish, MD     Referring MD: Eulas Post, MD     History of Present Illness:    Kyle [name] is a 77 y.o. male here for the follow-up of coronary artery disease, prior inferior, RCA ST elevation myocardial infarction in 2011.  Prior cardiac catheterization in 05/05/2016 showed patent stent in the right coronary artery.  Here again for follow-up.  Battles with COPD, difficult with mobility.  Plays golf less often.  Enjoys his his wife's cooking he has stated in the past, different soups during the wintertime including "swamp soup "which has collards 3 types of beans and a thin sausage.  At his last visit, he was accompanied by his wife. He reported that he continues to have shortness of breath. He used an inhaler as needed. He did complete physical activity(walking), but no formal exercise. He was currently working towards walking one mile three times per week. He had some exertional shortness of breath but he suspects that this is secondary to his COPD and being out of shape. He noted that COVID-19 had impeded his process of working out. His wife reported that they eat healthy, he will have grilled foods.  Today he states that he is still having some shortness of breath, COPD type changes.  Has not been extremely motivated to go to the gym.  No chest discomfort.  Checking labs today.  No nausea fevers chills syncope.  He did have COVID-vaccine last 1 made him feel terrible he states for 1 night.   Past Medical History:  Diagnosis Date   Anemia    CAD (coronary artery disease) 08/03/2009   Cardiac arrhythmia    COPD (chronic obstructive pulmonary disease) (HCC)    GERD (gastroesophageal reflux disease)    Hyperlipidemia    Hypertension    Hypothyroidism    Myocardial infarction ([place]) 08/03/2009    Sleep apnea treated with nocturnal BiPAP     Past Surgical History:  Procedure Laterality Date   APPENDECTOMY     CARDIAC CATHETERIZATION N/A 05/05/2016   Procedure: Left Heart Cath and Coronary Angiography;  Surgeon: Jerline Pain, MD;  Location: [facility];  Service: Cardiovascular;  Laterality: N/A;   CORONARY ANGIOPLASTY WITH STENT PLACEMENT     EYE SURGERY     lasix /BIL   TONSILLECTOMY      Current Medications: Current Meds  Medication Sig   albuterol (VENTOLIN HFA) 108 (90 Base) MCG/ACT inhaler Inhale 2 puffs into the lungs every 6 (six) hours as needed for wheezing or shortness of breath.   aspirin 81 MG tablet Take 81 mg by mouth daily.   atorvastatin (LIPITOR) 40 MG tablet TAKE ONE TABLET BY MOUTH EVERY EVENING AT 6P.M.   benazepril-hydrochlorthiazide (LOTENSIN HCT) 20-25 MG tablet TAKE ONE-HALF TABLET BY MOUTH ONE TIME DAILY   cetirizine (ZYRTEC) 10 MG tablet Take 10 mg by mouth daily.   Cyanocobalamin (B-12 PO) Take 1 tablet by mouth daily.   FEROSUL 325 (65 Fe) MG tablet TAKE ONE TABLET BY MOUTH ONE TIME DAILY WITH BREAKFAST   Fluticasone-Umeclidin-Vilant (TRELEGY ELLIPTA) 100-62.5-25 MCG/ACT AEPB Inhale 1 puff into the lungs daily.   levothyroxine (SYNTHROID) 50 MCG tablet Take 1 tablet (50 mcg total) by mouth daily before breakfast.   Multiple Vitamin (MULTIVITAMIN) tablet Take 1  tablet by mouth daily.   valACYclovir (VALTREX) 1000 MG tablet 1 g.     Allergies:   Patient has no known allergies.   Social History   Socioeconomic History   Marital status: Married    Spouse name: Not on file   Number of children: 0   Years of education: Not on file   Highest education level: 12th grade  Occupational History   Occupation: retired  Tobacco Use   Smoking status: Former    Packs/day: 4.00    Years: 28.00    Total pack years: 112.00    Types: Cigarettes    Quit date: 12/10/1993    Years since quitting: 28.7   Smokeless tobacco: Never  Vaping Use    Vaping Use: Never used  Substance and Sexual Activity   Alcohol use: Yes    Alcohol/week: 5.0 standard drinks of alcohol    Types: 5 Standard drinks or equivalent per week    Comment: once every 2 months   Drug use: No   Sexual activity: Not on file  Other Topics Concern   Not on file  Social History Narrative   Not on file   Social Determinants of Health   Financial Resource Strain: Low Risk  (10/13/2021)   Overall Financial Resource Strain (CARDIA)    Difficulty of Paying Living Expenses: Not hard at all  Food Insecurity: No Food Insecurity (10/13/2021)   Hunger Vital Sign    Worried About Running Out of Food in the Last Year: Never true    Ran Out of Food in the Last Year: Never true  Transportation Needs: No Transportation Needs (10/13/2021)   PRAPARE - Hydrologist (Medical): No    Lack of Transportation (Non-Medical): No  Physical Activity: Sufficiently Active (10/13/2021)   Exercise Vital Sign    Days of Exercise per Week: 3 days    Minutes of Exercise per Session: 90 min  Recent Concern: Physical Activity - Inactive (09/19/2021)   Exercise Vital Sign    Days of Exercise per Week: 0 days    Minutes of Exercise per Session: 30 min  Stress: No Stress Concern Present (10/13/2021)   [place]    Feeling of Stress : Not at all  Social Connections: [place] (10/13/2021)   Social Connection and Isolation Panel [NHANES]    Frequency of Communication with Friends and Family: More than three times a week    Frequency of Social Gatherings with Friends and Family: More than three times a week    Attends Religious Services: More than 4 times per year    Active Member of [employer] or Organizations: Yes    Attends Music therapist: More than 4 times per year    Marital Status: Married     Family History: The patient's family history includes Hypertension in his mother;  Hypotension in his father; Multiple sclerosis in his sister; Stroke in his father and mother; Thyroid disease in his brother.  ROS:   Please see the history of present illness.  (+) Chest pain  (+) Palpitations / "Heart flutters" (+) Shortness of breath  All other systems reviewed and are negative.  EKGs/Labs/Other Studies Reviewed:    The following studies were reviewed today:  Chest X-ray 06/11/2022: FINDINGS: Stable cardiomediastinal silhouette with normal heart size. No pneumothorax. No pleural effusion. Hyperinflated lungs. No pulmonary edema. No acute consolidative airspace disease. Scattered minimal reticular opacities at the right lung  base, similar.   IMPRESSION: 1. No acute cardiopulmonary disease. 2. Hyperinflated lungs, compatible with reported history of COPD. 3. Scattered reticular opacities at the right lung base, similar, favor mild scarring.  ECHO 06/18/22: Low normal ejection fraction or pump function of heart.  No significant valvular abnormalities.  Overall reassuring echocardiogram. Candee Furbish, MD   Echo 01/07/2021: IMPRESSIONS   1. Left ventricular ejection fraction, by estimation, is 50 to 55%. The  left ventricle has low normal function. The left ventricle has no regional  wall motion abnormalities. Left ventricular diastolic parameters are  consistent with Grade I diastolic  dysfunction (impaired relaxation).   2. Right ventricular systolic function is normal. The right ventricular  size is normal. Tricuspid regurgitation signal is inadequate for assessing  PA pressure.   3. The mitral valve is normal in structure. No evidence of mitral valve  regurgitation. No evidence of mitral stenosis.   4. The aortic valve is grossly normal. Aortic valve regurgitation is not  visualized. No aortic stenosis is present.   5. The inferior vena cava is normal in size with greater than 50%  respiratory variability, suggesting right atrial pressure of 3 mmHg.     05/05/16 LEFT HEART CATH AND CORONARY ANGIO- The left ventricular systolic function is normal. LV end diastolic pressure is normal. There is no aortic valve stenosis. There is no mitral valve regurgitation. Previously placed right coronary artery mid stent widely patent. 30-40% distal RCA stenosis Minor luminal irregularities in left system.  Stress test overall was low risk in 2023   EKG:  EKG is personally reviewed an interpreted.  06/16/2022: Sinus rhythm 71 bpm with ventricular bigeminy pattern.  03/19/2022: Normal sinus rhythm. Rate 69 bpm. 12/09/2020: Sinus bradycardia. Rate:55   Recent Labs: 07/01/2022: TSH 6.77  Recent Lipid Panel    Component Value Date/Time   CHOL 103 08/09/2020 1447   TRIG 184.0 (H) 08/09/2020 1447   HDL 23.30 (L) 08/09/2020 1447   CHOLHDL 4 08/09/2020 1447   VLDL 36.8 08/09/2020 1447   LDLCALC 43 08/09/2020 1447     Risk Assessment/Calculations:      Physical Exam:    VS:  BP (!) 146/70   Pulse 73   Ht '5\' 11"'$  (1.803 m)   Wt 241 lb (109.3 kg)   SpO2 95%   BMI 33.61 kg/m     Wt Readings from Last 3 Encounters:  09/07/22 241 lb (109.3 kg)  06/18/22 238 lb (108 kg)  06/16/22 238 lb (108 kg)     GEN: Well nourished, well developed, in no acute distress HEENT: normal Neck: no JVD, carotid bruits, or masses Cardiac: RRR; no murmurs, rubs, or gallops,no edema  Respiratory:  clear to auscultation bilaterally, normal work of breathing GI: soft, nontender, nondistended, + BS MS: no deformity or atrophy Skin: warm and dry, no rash Neuro:  Alert and Oriented x 3, Strength and sensation are intact Psych: euthymic mood, full affect   ASSESSMENT:    1. Hyperlipidemia, unspecified hyperlipidemia type   2. Coronary artery disease involving native coronary artery of native heart without angina pectoris   3. DOE (dyspnea on exertion)   4. COPD mixed type ([place])   5. Pure hypercholesterolemia      PLAN:    In order of problems listed  above:  Shortness of breath - Prior right coronary artery stent.  Look for any signs of ischemia.  Of course some of his shortness of breath may be associated with his underlying COPD. - Echocardiogram  performed in 2023 showed low normal ejection fraction no change from prior.  Continue to encourage conditioning exercise etc.  Stress test overall was low risk in 2023  Coronary artery disease - Cardiac catheterization 2017 showed patent right coronary artery stent.  No changes made.  Continue with goal-directed therapy, aspirin, high intensity statin.  Checking stress test as above.  Essential hypertension - Occasionally elevated mildly but overall well controlled.  Medications reviewed no changes made.  Continue to monitor.  Hyperlipidemia - On atorvastatin 40 mg.  LDL previously 56, no myalgias.  Last LDL 43 in 2022.  Excellent.  Doing well on goal-directed medical therapy.  He does not recall when his next appointment with Dr. Elease Hashimoto is.  We will go ahead and check some lab work today.  COPD/obstructive sleep apnea - Has seen Dr. Annamaria Boots in the past with pulmonary.  Continuing to monitor activity. -Encouraged for continued exercise.  Conditioning efforts.  Weight loss.  Encouraged him previously to ask questions to Dr. Annamaria Boots about treatment strategies.  He feels like the newer medicines do not really make any change.  He also sees the [place] [facility].  We talked about  trying to get back to the gym and walks the track.  Excellent.  This is challenging for him however.  1 year follow-up  Medication Adjustments/Labs and Tests Ordered: Current medicines are reviewed at length with the patient today.  Concerns regarding medicines are outlined above.  Orders Placed This Encounter  Procedures   Comprehensive metabolic panel   CBC w/Diff   Lipid panel   No orders of the defined types were placed in this encounter.  Patient Instructions  Medication Instructions:  Your physician recommends  that you continue on your current medications as directed. Please refer to the Current Medication list given to you today.  *If you need a refill on your cardiac medications before your next appointment, please call your pharmacy*   Lab Work: Please complete a CMET, CBC and a LIPID panel in our lab before you leave today.  If you have labs (blood work) drawn today and your tests are completely normal, you will receive your results only by: [place] (if you have MyChart) OR A paper copy in the mail If you have any lab test that is abnormal or we need to change your treatment, we will call you to review the results.   Testing/Procedures: None.   Follow-Up: At [facility], you and your health needs are our priority.  As part of our continuing mission to provide you with exceptional heart care, we have created designated Provider Care Teams.  These Care Teams include your primary Cardiologist (physician) and Advanced Practice Providers (APPs -  Physician Assistants and Nurse Practitioners) who all work together to provide you with the care you need, when you need it.  We recommend signing up for the patient portal called "MyChart".  Sign up information is provided on this After Visit Summary.  MyChart is used to connect with patients for Virtual Visits (Telemedicine).  Patients are able to view lab/test results, encounter notes, upcoming appointments, etc.  Non-urgent messages can be sent to your provider as well.   To learn more about what you can do with MyChart, go to [email or web address].    Your next appointment:   1 year(s)  Provider:   Candee Furbish, MD        I,Rachel Rivera,acting as a scribe for Candee Furbish, MD.,have documented all relevant documentation on the behalf of  Candee Furbish, MD,as directed by  Candee Furbish, MD while in the presence of Candee Furbish, MD.  I, Candee Furbish, MD, have reviewed all documentation for this visit. The documentation on 09/07/22  for the exam, diagnosis, procedures, and orders are all accurate and complete.   Signed, Candee Furbish, MD  09/07/2022 8:30 AM    [facility]

## 2022-09-07 NOTE — Patient Instructions (Signed)
Medication Instructions:  Your physician recommends that you continue on your current medications as directed. Please refer to the Current Medication list given to you today.  *If you need a refill on your cardiac medications before your next appointment, please call your pharmacy*   Lab Work: Please complete a CMET, CBC and a LIPID panel in our lab before you leave today.  If you have labs (blood work) drawn today and your tests are completely normal, you will receive your results only by: [place] (if you have MyChart) OR A paper copy in the mail If you have any lab test that is abnormal or we need to change your treatment, we will call you to review the results.   Testing/Procedures: None.   Follow-Up: At [facility], you and your health needs are our priority.  As part of our continuing mission to provide you with exceptional heart care, we have created designated Provider Care Teams.  These Care Teams include your primary Cardiologist (physician) and Advanced Practice Providers (APPs -  Physician Assistants and Nurse Practitioners) who all work together to provide you with the care you need, when you need it.  We recommend signing up for the patient portal called "MyChart".  Sign up information is provided on this After Visit Summary.  MyChart is used to connect with patients for Virtual Visits (Telemedicine).  Patients are able to view lab/test results, encounter notes, upcoming appointments, etc.  Non-urgent messages can be sent to your provider as well.   To learn more about what you can do with MyChart, go to [email or web address].    Your next appointment:   1 year(s)  Provider:   Candee Furbish, MD

## 2022-09-08 LAB — CBC WITH DIFFERENTIAL/PLATELET
Basophils Absolute: 0.1 10*3/uL (ref 0.0–0.2)
Basos: 1 %
EOS (ABSOLUTE): 0.3 10*3/uL (ref 0.0–0.4)
Eos: 5 %
Hematocrit: 41.9 % (ref 37.5–51.0)
Hemoglobin: 14.5 g/dL (ref 13.0–17.7)
Immature Grans (Abs): 0 10*3/uL (ref 0.0–0.1)
Immature Granulocytes: 1 %
Lymphocytes Absolute: 1.3 10*3/uL (ref 0.7–3.1)
Lymphs: 23 %
MCH: 34 pg — ABNORMAL HIGH (ref 26.6–33.0)
MCHC: 34.6 g/dL (ref 31.5–35.7)
MCV: 98 fL — ABNORMAL HIGH (ref 79–97)
Monocytes Absolute: 0.7 10*3/uL (ref 0.1–0.9)
Monocytes: 11 %
Neutrophils Absolute: 3.5 10*3/uL (ref 1.4–7.0)
Neutrophils: 59 %
Platelets: 154 10*3/uL (ref 150–450)
RBC: 4.27 x10E6/uL (ref 4.14–5.80)
RDW: 12.3 % (ref 11.6–15.4)
WBC: 5.8 10*3/uL (ref 3.4–10.8)

## 2022-09-08 LAB — COMPREHENSIVE METABOLIC PANEL
ALT: 47 IU/L — ABNORMAL HIGH (ref 0–44)
AST: 41 IU/L — ABNORMAL HIGH (ref 0–40)
Albumin/Globulin Ratio: 1.8 (ref 1.2–2.2)
Albumin: 4.4 g/dL (ref 3.8–4.8)
Alkaline Phosphatase: 80 IU/L (ref 44–121)
BUN/Creatinine Ratio: 15 (ref 10–24)
BUN: 16 mg/dL (ref 8–27)
Bilirubin Total: 0.5 mg/dL (ref 0.0–1.2)
CO2: 26 mmol/L (ref 20–29)
Calcium: 9.5 mg/dL (ref 8.6–10.2)
Chloride: 102 mmol/L (ref 96–106)
Creatinine, Ser: 1.06 mg/dL (ref 0.76–1.27)
Globulin, Total: 2.5 g/dL (ref 1.5–4.5)
Glucose: 124 mg/dL — ABNORMAL HIGH (ref 70–99)
Potassium: 4.5 mmol/L (ref 3.5–5.2)
Sodium: 141 mmol/L (ref 134–144)
Total Protein: 6.9 g/dL (ref 6.0–8.5)
eGFR: 73 mL/min/{1.73_m2} (ref >59)

## 2022-09-08 LAB — LIPID PANEL
Chol/HDL Ratio: 3.8 ratio (ref 0.0–5.0)
Cholesterol, Total: 123 mg/dL (ref 100–199)
HDL: 32 mg/dL — ABNORMAL LOW (ref >39)
LDL Chol Calc (NIH): 77 mg/dL (ref 0–99)
Triglycerides: 67 mg/dL (ref 0–149)
VLDL Cholesterol Cal: 14 mg/dL (ref 5–40)

## 2022-10-08 ENCOUNTER — Other Ambulatory Visit (HOSPITAL_BASED_OUTPATIENT_CLINIC_OR_DEPARTMENT_OTHER): Payer: Self-pay

## 2022-10-08 ENCOUNTER — Encounter: Payer: Self-pay | Admitting: Family Medicine

## 2022-10-08 ENCOUNTER — Telehealth (INDEPENDENT_AMBULATORY_CARE_PROVIDER_SITE_OTHER): Payer: Medicare HMO | Admitting: Family Medicine

## 2022-10-08 VITALS — BP 144/76 | HR 72 | Temp 97.8°F

## 2022-10-08 DIAGNOSIS — U071 COVID-19: Secondary | ICD-10-CM

## 2022-10-08 MED ORDER — BENZONATATE 100 MG PO CAPS
100.0000 mg | ORAL_CAPSULE | Freq: Two times a day (BID) | ORAL | 0 refills | Status: DC | PRN
  Filled 2022-10-08: qty 30, 8d supply, fill #0

## 2022-10-08 MED ORDER — NIRMATRELVIR/RITONAVIR (PAXLOVID)TABLET
3.0000 | ORAL_TABLET | Freq: Two times a day (BID) | ORAL | 0 refills | Status: AC
  Filled 2022-10-08: qty 30, 5d supply, fill #0

## 2022-10-08 NOTE — Progress Notes (Signed)
Virtual Visit via Video Note  I connected with Kyle [name]  on 10/08/22 at 12:40 PM EST by a video enabled telemedicine application and verified that I am speaking with the correct person using two identifiers.  Location patient: [place] Location provider:work or home office Persons participating in the virtual visit: patient, provider  I discussed the limitations and requested verbal permission for telemedicine visit. The patient expressed understanding and agreed to proceed.   HPI:  Acute telemedicine visit for Covid: -Onset: last night, tested positive for covid -Symptoms include:cough, congestion, reports some chest tightness - relieved with albuterol, coughing up mucus - albuterol helps, reports feels fine now -Denies: CP, SOB, fever, NVD -Pertinent past medical history: see below, GFR 73, has had covid before -Pertinent medication allergies:No Known Allergies -COVID-19 vaccine status:  Immunization History  Administered Date(s) Administered   COVID-19, mRNA, vaccine(Comirnaty)12 years and older 06/05/2022   Fluad Quad(high Dose 65+) 04/11/2019, 04/15/2020, 05/12/2021, 04/29/2022   Influenza Split 05/17/2012, 05/10/2014, 05/03/2016, 04/03/2017   Influenza, High Dose Seasonal PF 04/03/2016, 04/06/2017, 04/03/2018, 04/19/2018   Influenza,inj,Quad PF,6+ Mos 04/13/2013, 04/29/2015   Influenza-Unspecified 04/11/2019   PFIZER(Purple Top)SARS-COV-2 Vaccination 09/10/2019, 10/04/2019, 05/03/2020, 12/07/2020   Pneumococcal Conjugate-13 03/13/2016   Pneumococcal Polysaccharide-23 05/17/2012   Tdap 11/12/2011, 03/13/2016, 08/24/2018   Zoster Recombinat (Shingrix) 08/23/2018, 02/20/2019   Zoster, Live 06/16/2013     ROS: See pertinent positives and negatives per HPI.  Past Medical History:  Diagnosis Date   Anemia    CAD (coronary artery disease) 08/03/2009   Cardiac arrhythmia    COPD (chronic obstructive pulmonary disease) (HCC)    GERD (gastroesophageal reflux disease)    Hyperlipidemia     Hypertension    Hypothyroidism    Myocardial infarction ([place]) 08/03/2009   Sleep apnea treated with nocturnal BiPAP     Past Surgical History:  Procedure Laterality Date   APPENDECTOMY     CARDIAC CATHETERIZATION N/A 05/05/2016   Procedure: Left Heart Cath and Coronary Angiography;  Surgeon: Jerline Pain, MD;  Location: [facility];  Service: Cardiovascular;  Laterality: N/A;   CORONARY ANGIOPLASTY WITH STENT PLACEMENT     EYE SURGERY     lasix /BIL   TONSILLECTOMY       Current Outpatient Medications:    albuterol (VENTOLIN HFA) 108 (90 Base) MCG/ACT inhaler, Inhale 2 puffs into the lungs every 6 (six) hours as needed for wheezing or shortness of breath., Disp: 8 g, Rfl: 12   aspirin 81 MG tablet, Take 81 mg by mouth daily., Disp: , Rfl:    atorvastatin (LIPITOR) 40 MG tablet, TAKE ONE TABLET BY MOUTH EVERY EVENING AT 6P.M., Disp: 90 tablet, Rfl: 3   benazepril-hydrochlorthiazide (LOTENSIN HCT) 20-25 MG tablet, TAKE ONE-HALF TABLET BY MOUTH ONE TIME DAILY, Disp: 45 tablet, Rfl: 3   benzonatate (TESSALON PERLES) 100 MG capsule, Take 1-2 capsules (100-200 mg total) by mouth up to 2 (two) times daily as needed for cough., Disp: 30 capsule, Rfl: 0   cetirizine (ZYRTEC) 10 MG tablet, Take 10 mg by mouth daily., Disp: , Rfl:    Cyanocobalamin (B-12 PO), Take 1 tablet by mouth daily., Disp: , Rfl:    FEROSUL 325 (65 Fe) MG tablet, TAKE ONE TABLET BY MOUTH ONE TIME DAILY WITH BREAKFAST, Disp: 90 tablet, Rfl: 3   Fluticasone-Umeclidin-Vilant (TRELEGY ELLIPTA) 100-62.5-25 MCG/ACT AEPB, Inhale 1 puff into the lungs daily., Disp: 1 each, Rfl: 11   levothyroxine (SYNTHROID) 50 MCG tablet, Take 1 tablet (50 mcg total) by mouth daily  before breakfast., Disp: 90 tablet, Rfl: 1   Multiple Vitamin (MULTIVITAMIN) tablet, Take 1 tablet by mouth daily., Disp: , Rfl:    nirmatrelvir/ritonavir (PAXLOVID) 20 x 150 MG & 10 x '100MG'$  TABS, Take 3 tablets by mouth 2 (two) times daily for 5 days. (Take  nirmatrelvir 150 mg two tablets twice daily for 5 days and ritonavir 100 mg one tablet twice daily for 5 days)., Disp: 30 tablet, Rfl: 0   valACYclovir (VALTREX) 1000 MG tablet, 1 g., Disp: , Rfl:   EXAM:  VITALS per patient if applicable:  GENERAL: alert, oriented, appears well and in no acute distress  HEENT: atraumatic, conjunttiva clear, no obvious abnormalities on inspection of external nose and ears  NECK: normal movements of the head and neck  LUNGS: on inspection no signs of respiratory distress, breathing rate appears normal, no obvious gross SOB, gasping or wheezing  CV: no obvious cyanosis  MS: moves all visible extremities without noticeable abnormality  PSYCH/NEURO: pleasant and cooperative, no obvious depression or anxiety, speech and thought processing grossly intact  ASSESSMENT AND PLAN:  Discussed the following assessment and plan:  COVID-19   Discussed treatment options, side effect and risk of drug interactions, ideal treatment window, potential complications, isolation and precautions for COVID-19.  Discussed possibility of rebound with or without antivirals. Checked for/reviewed last GFR - listed in HPI if available. Advised inperson evaluation if any CP, SOB beyond his normal, chest pressure, etc. They feels strongly this is about the way he feels when he gets a cold or bug and did not wish to go for inperson evaluation unless worsens or is not improving. Advised can use alb prn q 4- 6 hours.  After lengthy discussion, the patient opted for treatment with Paxlovid due to being higher risk for complications of covid or severe disease and other factors. Discussed knowledge of risks/interactions/side effects vs possible benefits and precautions. This information was shared with patient during the visit and also was provided in patient instructions. Also, advised that patient discuss risks/interactions and use with pharmacist/treatment team as well. Advised to hold  statin until 3 days after finishing paxlovid.  The patient did want a prescription for cough, Tessalon Rx sent.  Other symptomatic care measures summarized in patient instructions.  Advised to seek prompt virtual visit or in person care if worsening, new symptoms arise, or if is not improving with treatment as expected per our conversation of expected course. Discussed options for follow up care. Did let this patient know that I do telemedicine on Tuesdays and Thursdays for [place] and those are the days I am logged into the system. Advised to schedule follow up visit with PCP, [place] virtual visits or [facility] if any further questions or concerns to avoid delays in care.   I discussed the assessment and treatment plan with the patient. The patient was provided an opportunity to ask questions and all were answered. The patient agreed with the plan and demonstrated an understanding of the instructions.     Lucretia Kern, DO

## 2022-10-08 NOTE — Patient Instructions (Signed)
HOME CARE TIPS:    -I sent the medication(s) we discussed to your pharmacy: Meds ordered this encounter  Medications   nirmatrelvir/ritonavir (PAXLOVID) 20 x 150 MG & 10 x '100MG'$  TABS    Sig: Take 3 tablets by mouth 2 (two) times daily for 5 days. (Take nirmatrelvir 150 mg two tablets twice daily for 5 days and ritonavir 100 mg one tablet twice daily for 5 days).    Dispense:  30 tablet    Refill:  0    Patient GFR is 73   benzonatate (TESSALON PERLES) 100 MG capsule    Sig: Take 1-2 capsules (100-200 mg total) by mouth up to 2 (two) times daily as needed for cough.    Dispense:  30 capsule    Refill:  0     -I sent in the [facility] treatment or referral you requested per our discussion. Please see the information provided below and discuss further with the pharmacist/treatment team.  -If taking Paxlovid, please review all medications, supplement and over the counter drugs with your pharmacist and ask them to check for any interactions. Please make the following changes to your regular medications while taking Paxlovid: *HOLD your atorvastatin (cholesterol medication) - restart 3 days after finishing Paxlovid  -there is a chance of rebound illness with covid after improving. This can happen whether or not you take an antiviral treatment. If you become sick again with covid after getting better, please schedule a follow up virtual visit and isolate again.  -can use tylenol if needed for fevers, aches and pains per instructions  -nasal saline sinus rinses twice daily  -stay hydrated, drink plenty of fluids and eat small healthy meals - avoid dairy   -follow up with your doctor in 2-3 days unless improving and feeling better  -stay home while sick, except to seek medical care. If you have COVID19, you will likely be contagious for 7-10 days. Flu or Influenza is likely contagious for about 7 days. Other respiratory viral infections remain contagious for 5-10+ days depending on the virus  and many other factors. Wear a good mask that fits snugly (such as N95 or KN95) if around others to reduce the risk of transmission.  It was nice to meet you today, and I really hope you are feeling better soon. I help [facility] out with telemedicine visits on Tuesdays and Thursdays and am happy to help if you need a follow up virtual visit on those days. Otherwise, if you have any concerns or questions following this visit please schedule a follow up visit with your Primary Care doctor or seek care at a local urgent care clinic to avoid delays in care.    Seek in person care or schedule a follow up video visit promptly if your symptoms worsen, new concerns arise or you are not improving with treatment. Call 911 and/or seek emergency care if your symptoms are severe or life threatening.   See the following link for the most recent information regarding Paxlovid:  [email or web address]   Nirmatrelvir; Ritonavir Tablets What is this medication? NIRMATRELVIR; RITONAVIR (NIR ma TREL vir; ri TOE na veer) treats mild to moderate COVID-19. It may help people who are at high risk of developing severe illness. This medication works by limiting the spread of the virus in your body. The FDA has allowed the emergency use of this medication. This medicine may be used for other purposes; ask your health care provider or pharmacist if you have questions. COMMON BRAND NAME(S): PAXLOVID What  should I tell my care team before I take this medication? They need to know if you have any of these conditions: Any allergies Any serious illness Kidney disease Liver disease An unusual or allergic reaction to nirmatrelvir, ritonavir, other medications, foods, dyes, or preservatives Pregnant or trying to get pregnant Breast-feeding How should I use this medication? This product contains 2 different medications that are packaged together. For the standard dose, take 2 pink tablets of nirmatrelvir with 1 white tablet of  ritonavir (3 tablets total) by mouth with water twice daily. Talk to your care team if you have kidney disease. You may need a different dose. Swallow the tablets whole. You can take it with or without food. If it upsets your stomach, take it with food. Take all of this medication unless your care team tells you to stop it early. Keep taking it even if you think you are better. Talk to your care team about the use of this medication in children. While it may be prescribed for children as young as 12 years for selected conditions, precautions do apply. Overdosage: If you think you have taken too much of this medicine contact a poison control center or emergency room at once. NOTE: This medicine is only for you. Do not share this medicine with others. What if I miss a dose? If you miss a dose, take it as soon as you can unless it is more than 8 hours late. If it is more than 8 hours late, skip the missed dose. Take the next dose at the normal time. Do not take extra or 2 doses at the same time to make up for the missed dose. What may interact with this medication? Do not take this medication with any of the following medications: Alfuzosin Certain medications for anxiety or sleep like midazolam, triazolam Certain medications for cancer like apalutamide, enzalutamide Certain medications for cholesterol like lovastatin, simvastatin Certain medications for irregular heart beat like amiodarone, dronedarone, flecainide, propafenone, quinidine Certain medications for pain like meperidine, piroxicam Certain medications for psychotic disorders like clozapine, lurasidone, pimozide Certain medications for seizures like carbamazepine, phenobarbital, phenytoin Colchicine Eletriptan Eplerenone Ergot alkaloids like dihydroergotamine, ergonovine, ergotamine, methylergonovine Finerenone Flibanserin Ivabradine Lomitapide Naloxegol Ranolazine Rifampin Sildenafil Silodosin [facility]  Wort Tolvaptan Ubrogepant Voclosporin This medication may also interact with the following medications: Bedaquiline Birth control pills Bosentan Certain antibiotics like erythromycin or clarithromycin Certain medications for blood pressure like amlodipine, diltiazem, felodipine, nicardipine, nifedipine Certain medications for cancer like abemaciclib, ceritinib, dasatinib, encorafenib, ibrutinib, ivosidenib, neratinib, nilotinib, venetoclax, vinblastine, vincristine Certain medications for cholesterol like atorvastatin, rosuvastatin Certain medications for depression like bupropion, trazodone Certain medications for fungal infections like isavuconazonium, itraconazole, ketoconazole, voriconazole Certain medications for hepatitis C like elbasvir; grazoprevir, dasabuvir; ombitasvir; paritaprevir; ritonavir, glecaprevir; pibrentasvir, sofosbuvir; velpatasvir; voxilaprevir Certain medications for HIV or AIDS Certain medications for irregular heartbeat like lidocaine Certain medications that treat or prevent blood clots like rivaroxaban, warfarin Digoxin Fentanyl Medications that lower your chance of fighting infection like cyclosporine, sirolimus, tacrolimus Methadone Quetiapine Rifabutin Salmeterol Steroid medications like betamethasone, budesonide, ciclesonide, dexamethasone, fluticasone, methylprednisone, mometasone, triamcinolone This list may not describe all possible interactions. Give your health care provider a list of all the medicines, herbs, non-prescription drugs, or dietary supplements you use. Also tell them if you smoke, drink alcohol, or use illegal drugs. Some items may interact with your medicine. What should I watch for while using this medication? Your condition will be monitored carefully while you are receiving this medication. Visit your  care team for regular checkups. Tell your care team if your symptoms do not start to get better or if they get worse. If you have  untreated HIV infection, this medication may lead to some HIV medications not working as well in the future. Birth control may not work properly while you are taking this medication. Talk to your care team about using an extra method of birth control. What side effects may I notice from receiving this medication? Side effects that you should report to your care team as soon as possible: Allergic reactions--skin rash, itching, hives, swelling of the face, lips, tongue, or throat Liver injury--right upper belly pain, loss of appetite, nausea, light-colored stool, dark yellow or brown urine, yellowing skin or eyes, unusual weakness or fatigue Redness, blistering, peeling, or loosening of the skin, including inside the mouth Side effects that usually do not require medical attention (report these to your care team if they continue or are bothersome): Change in taste Diarrhea General discomfort and fatigue Increase in blood pressure Muscle pain Nausea Stomach pain This list may not describe all possible side effects. Call your doctor for medical advice about side effects. You may report side effects to FDA at 1-800-FDA-1088. Where should I keep my medication? Keep out of the reach of children and pets. Store at room temperature between 20 and 25 degrees C (68 and 77 degrees F). Get rid of any unused medication after the expiration date. To get rid of medications that are no longer needed or have expired: Take the medication to a medication take-back program. Check with your pharmacy or law enforcement to find a location. If you cannot return the medication, check the label or package insert to see if the medication should be thrown out in the garbage or flushed down the toilet. If you are not sure, ask your care team. If it is safe to put it in the trash, take the medication out of the container. Mix the medication with cat litter, dirt, coffee grounds, or other unwanted substance. Seal the mixture  in a bag or container. Put it in the trash. NOTE: This sheet is a summary. It may not cover all possible information. If you have questions about this medicine, talk to your doctor, pharmacist, or health care provider.  2022 [employer] (2021-04-21 00:00:00)

## 2022-10-09 DIAGNOSIS — G4733 Obstructive sleep apnea (adult) (pediatric): Secondary | ICD-10-CM | POA: Diagnosis not present

## 2022-10-11 ENCOUNTER — Other Ambulatory Visit: Payer: Self-pay | Admitting: Family Medicine

## 2022-10-11 DIAGNOSIS — E039 Hypothyroidism, unspecified: Secondary | ICD-10-CM

## 2022-10-15 ENCOUNTER — Telehealth (INDEPENDENT_AMBULATORY_CARE_PROVIDER_SITE_OTHER): Payer: Medicare HMO | Admitting: Family Medicine

## 2022-10-15 DIAGNOSIS — Z0000 Encounter for general adult medical examination without abnormal findings: Secondary | ICD-10-CM

## 2022-10-15 NOTE — Progress Notes (Signed)
PATIENT CHECK-IN and HEALTH RISK ASSESSMENT QUESTIONNAIRE:  -completed by phone/video for upcoming Medicare Preventive Visit  Pre-Visit Check-in: 1)Vitals (height, wt, BP, etc) - record in vitals section for visit on day of visit 2)Review and Update Medications, Allergies PMH, Surgeries, Social history in Epic 3)Hospitalizations in the last year with date/reason?  No 4)Review and Update Care Team (patient's specialists) in Epic 5) Complete PHQ9 in Epic  6) Complete Fall Screening in Epic 7)Review all Health Maintenance Due and order under PCP if not done.  Medicare Wellness Patient Questionnaire:  Answer theses question about your habits: Do you drink alcohol? Yes    If yes, how many drinks do you have a day?  Rare Have you ever smoked?Yes    Quit date if applicable?  quit 25 years ago How many packs a day do/did you smoke? 4 packs Do you use smokeless tobacco?No  Do you use an illicit drugs?No  Do you exercises? No - but does gardening and yard work and work around the house so is pretty busy, and goes to [employer] a few times per week and walks on the track and uses the machines     Are you sexually active? No Number of partners?N/A Reports wife cooks and they eat a lot of fruits and veggies Typical breakfast: eggs, bacon, ham,grits Typical lunch: sometimes patient may not have lunch Typical dinner: fish, pork, chicken, salad Typical snacks:fruits  Beverages: water,coffee, tea, tomato juice  Answer theses question about you: Can you perform most household chores?Yes Do you find it hard to follow a conversation in a noisy room?No Do you often ask people to speak up or repeat themselves?No  Do you feel that you have a problem with memory?No Do you balance your checkbook and or bank acounts?No, wife handles Do you feel safe at home?Yes  Last dentist visit?6 months  Do you need assistance with any of the following: Please note if so   Driving?-No   Feeding yourself?-No   Getting  from bed to chair?-No  Getting to the toilet?-No  Bathing or showering?-No  Dressing yourself?-No  Managing money?-No  Climbing a flight of stairs-No  Preparing meals?-No    Do you have Advanced Directives in place (Living Will, Healthcare Power or Attorney)? Yes    Last eye Exam and location?6 month, [facility]    Do you currently use prescribed or non-prescribed narcotic or opioid pain medications?No   Do you have a history or close family history of breast, ovarian, tubal or peritoneal cancer or a family member with BRCA (breast cancer susceptibility 1 and 2) gene mutations?  Nurse/Assistant Credentials/time stamp: [facility]    ----------------------------------------------------------------------------------------------------------------------------------------------------------------------------------------------------------------------    [place] (Welcome to [employer], initial annual wellness or annual wellness exam)  Virtual Visit via Video  Note  I connected with [name] on 10/15/22  by a video enabled telemedicine application and verified that I am speaking with the correct person using two identifiers.  Location patient: home Location provider:work or home office Persons participating in the virtual visit: patient, provider  Concerns and/or follow up today: none, reports recovered well from covid, doing well - no concerns.   See HM section in Epic for other details of completed HM.    ROS: negative for report of fevers, unintentional weight loss, vision changes, vision loss, hearing loss or change, chest pain, sob, hemoptysis, melena, hematochezia, or lesions, falls, bleeding or bruising, loc, thoughts of suicide or self harm, memory loss  Patient-completed extensive health risk assessment - reviewed and discussed with the patient: See Health Risk Assessment completed with patient prior to the visit either  above or in recent phone note. This was reviewed in detailed with the patient today and appropriate recommendations, orders and referrals were placed as needed per Summary below and patient instructions.   Review of Medical History: -PMH, PSH, Family History and current specialty and care providers reviewed and updated and listed below   Patient Care Team: Eulas Post, MD as PCP - General (Family Medicine) Jerline Pain, MD as PCP - Cardiology (Cardiology)   Past Medical History:  Diagnosis Date   Anemia    CAD (coronary artery disease) 08/03/2009   Cardiac arrhythmia    COPD (chronic obstructive pulmonary disease) ([place])    GERD (gastroesophageal reflux disease)    Hyperlipidemia    Hypertension    Hypothyroidism    Myocardial infarction ([place]) 08/03/2009   Sleep apnea treated with nocturnal BiPAP     Past Surgical History:  Procedure Laterality Date   APPENDECTOMY     CARDIAC CATHETERIZATION N/A 05/05/2016   Procedure: Left Heart Cath and Coronary Angiography;  Surgeon: Jerline Pain, MD;  Location: [facility];  Service: Cardiovascular;  Laterality: N/A;   CORONARY ANGIOPLASTY WITH STENT PLACEMENT     EYE SURGERY     lasix /BIL   TONSILLECTOMY      Social History   Socioeconomic History   Marital status: Married    Spouse name: Not on file   Number of children: 0   Years of education: Not on file   Highest education level: 12th grade  Occupational History   Occupation: retired  Tobacco Use   Smoking status: Former    Packs/day: 4.00    Years: 28.00    Additional pack years: 0.00    Total pack years: 112.00    Types: Cigarettes    Quit date: 12/10/1993    Years since quitting: 28.8   Smokeless tobacco: Never  Vaping Use   Vaping Use: Never used  Substance and Sexual Activity   Alcohol use: Yes    Alcohol/week: 5.0 standard drinks of alcohol    Types: 5 Standard drinks or equivalent per week    Comment: once every 2 months   Drug use: No    Sexual activity: Not on file  Other Topics Concern   Not on file  Social History Narrative   Not on file   Social Determinants of Health   Financial Resource Strain: Low Risk  (10/13/2021)   Overall Financial Resource Strain (CARDIA)    Difficulty of Paying Living Expenses: Not hard at all  Food Insecurity: No Food Insecurity (10/13/2021)   Hunger Vital Sign    Worried About Running Out of Food in the Last Year: Never true    Ran Out of Food in the Last Year: Never true  Transportation Needs: No Transportation Needs (10/13/2021)   PRAPARE - Hydrologist (Medical): No    Lack of Transportation (Non-Medical): No  Physical Activity: Sufficiently Active (10/13/2021)   Exercise Vital Sign    Days of Exercise per Week: 3 days    Minutes of Exercise per Session: 90 min  Recent Concern: Physical Activity - Inactive (09/19/2021)   Exercise Vital Sign    Days of Exercise per Week: 0 days    Minutes of Exercise per Session: 30 min  Stress: No Stress Concern Present (10/13/2021)   [place]  [facility]    Feeling of Stress : Not at all  Social Connections: Socially Integrated (10/13/2021)   Social Connection and Isolation Panel [NHANES]    Frequency of Communication with Friends and Family: More than three times a week    Frequency of Social Gatherings with Friends and Family: More than three times a week    Attends Religious Services: More than 4 times per year    Active Member of [employer] or Organizations: Yes    Attends Music therapist: More than 4 times per year    Marital Status: Married  Human resources officer Violence: Not At Risk (10/13/2021)   Humiliation, Afraid, Rape, and Kick questionnaire    Fear of Current or Ex-Partner: No    Emotionally Abused: No    Physically Abused: No    Sexually Abused: No    Family History  Problem Relation Age of Onset   Stroke Mother    Hypertension Mother     Stroke Father    Hypotension Father    Multiple sclerosis Sister    Thyroid disease Brother     Current Outpatient Medications on File Prior to Visit  Medication Sig Dispense Refill   albuterol (VENTOLIN HFA) 108 (90 Base) MCG/ACT inhaler Inhale 2 puffs into the lungs every 6 (six) hours as needed for wheezing or shortness of breath. 8 g 12   aspirin 81 MG tablet Take 81 mg by mouth daily.     atorvastatin (LIPITOR) 40 MG tablet TAKE ONE TABLET BY MOUTH EVERY EVENING AT 6P.M. 90 tablet 3   benazepril-hydrochlorthiazide (LOTENSIN HCT) 20-25 MG tablet TAKE ONE-HALF TABLET BY MOUTH ONE TIME DAILY 45 tablet 3   benzonatate (TESSALON PERLES) 100 MG capsule Take 1-2 capsules (100-200 mg total) by mouth up to 2 (two) times daily as needed for cough. 30 capsule 0   cetirizine (ZYRTEC) 10 MG tablet Take 10 mg by mouth daily.     Cyanocobalamin (B-12 PO) Take 1 tablet by mouth daily.     FEROSUL 325 (65 Fe) MG tablet TAKE ONE TABLET BY MOUTH ONE TIME DAILY WITH BREAKFAST 90 tablet 3   Fluticasone-Umeclidin-Vilant (TRELEGY ELLIPTA) 100-62.5-25 MCG/ACT AEPB Inhale 1 puff into the lungs daily. 1 each 11   levothyroxine (SYNTHROID) 50 MCG tablet TAKE ONE TABLET BY MOUTH ONE TIME DAILY BEFORE BREAKFAST 30 tablet 0   Multiple Vitamin (MULTIVITAMIN) tablet Take 1 tablet by mouth daily.     valACYclovir (VALTREX) 1000 MG tablet 1 g.     No current facility-administered medications on file prior to visit.    No Known Allergies     Physical Exam There were no vitals filed for this visit. Estimated body mass index is 33.61 kg/m as calculated from the following:   Height as of 09/07/22: '5\' 11"'$  (1.803 m).   Weight as of 09/07/22: 241 lb (109.3 kg).  EKG (optional): deferred due to virtual visit  GENERAL: alert, oriented, no acute distress detected; full vision exam deferred due to pandemic and/or virtual encounter  HEENT: atraumatic, conjunttiva clear, no obvious abnormalities on inspection of external  nose and ears  NECK: normal movements of the head and neck  LUNGS: on inspection no signs of respiratory distress, breathing rate appears normal, no obvious gross SOB, gasping or wheezing  CV: no obvious cyanosis  MS: moves all visible extremities without noticeable abnormality  PSYCH/NEURO: pleasant and cooperative, no obvious depression or anxiety, speech and thought processing grossly intact, Cognitive function  grossly intact  Flowsheet Row Video Visit from 10/15/2022 in [facility]  PHQ-9 Total Score 0           10/15/2022   12:03 PM 10/15/2022   12:02 PM 10/13/2021   12:47 PM 04/23/2021    9:45 AM 04/23/2021    9:44 AM  Depression screen PHQ 2/9  Decreased Interest 0 0 0 0 0  Down, Depressed, Hopeless 0 0 0 0 0  PHQ - 2 Score 0 0 0 0 0  Altered sleeping 0   0   Tired, decreased energy 0   0   Change in appetite 0   0   Feeling bad or failure about yourself  0   0   Trouble concentrating 0   0   Moving slowly or fidgety/restless 0   0   Suicidal thoughts 0   0   PHQ-9 Score 0   0   Difficult doing work/chores Not difficult at all   Not difficult at all        04/23/2021    9:43 AM 09/15/2021    5:07 PM 09/19/2021   10:51 PM 10/13/2021   12:51 PM 10/15/2022   12:02 PM  Fall Risk  Falls in the past year? 0 0 0 0 0  Was there an injury with Fall?    0 0  Fall Risk Category Calculator    0 0  Fall Risk Category (Retired)    Low   (RETIRED) Patient Fall Risk Level Low fall risk   Low fall risk   Patient at Risk for Falls Due to    No Fall Risks   Fall risk Follow up Falls prevention discussed         SUMMARY AND PLAN:  Encounter for Medicare annual wellness exam   Discussed applicable health maintenance/preventive health measures and advised and referred or ordered per patient preferences:  Health Maintenance  Topic Date Due   COVID-19 Vaccine (6 - 2023-24 season) 04/04/2023 (Originally 07/31/2022)   Medicare Annual Wellness  (AWV)  10/15/2023   DTaP/Tdap/Td (4 - Td or Tdap) 08/24/2028   COLONOSCOPY (Pts 45-64yr Insurance coverage will need to be confirmed)  04/22/2031   Pneumonia Vaccine 77 Years old  Completed   INFLUENZA VACCINE  Completed   Hepatitis C Screening  Completed   Zoster Vaccines- Shingrix  Completed   HPV VACCINES  Aged Out   Education and counseling on the following was provided based on the above review of health and a plan/checklist for the patient, along with additional information discussed, was provided for the patient in the patient instructions : . -Advised and counseled on maintaining  healthy lifestyle - including the importance of a healthy diet, regular physical activity, social connections and stress management. -Advised and counseled on a whole foods based healthy diet and regular exercise: discussed a heart healthy whole foods based diet at length. Further info provided in patient instructions.  -Recommended continued regular exercise and discussed guidelines. He admits to needing to increase strength training but is concerned about picking up resp illness at the gym Discussed options for strength training at home safely.  -Advise yearly dental visits at minimum and regular eye exams -Advised and counseled on alcohol use/limits.   Follow up: see patient instructions   Patient Instructions  I really enjoyed getting to talk with you today! I am available on Tuesdays and Thursdays for virtual visits if you have any questions or concerns, or if  I can be of any further assistance.   CHECKLIST FROM ANNUAL WELLNESS VISIT:  -Follow up (please call to schedule if not scheduled after visit):  -Inperson visit with your Primary Doctor office: -yearly for annual wellness visit with primary care office  Here is a list of your preventive care/health maintenance measures and the plan for each if any are due:  Health Maintenance  Topic Date Due   COVID-19 Vaccine (6 - 2023-24 season)  04/04/2023 (Originally 07/31/2022)   Medicare Annual Wellness (AWV)  10/15/2023   DTaP/Tdap/Td (4 - Td or Tdap) 08/24/2028   COLONOSCOPY (Pts 45-49yr Insurance coverage will need to be confirmed)  04/22/2031   Pneumonia Vaccine 77 Years old  Completed   INFLUENZA VACCINE  Completed   Hepatitis C Screening  Completed   Zoster Vaccines- Shingrix  Completed   HPV VACCINES  Aged Out    -See a dentist at least yearly  -Get your eyes checked and then per your eye specialist's recommendations  -Other issues addressed today: -  -I have included below further information regarding a healthy whole foods based diet, physical activity guidelines for adults, stress management and opportunities for social connections. I hope you find this information useful.   -----------------------------------------------------------------------------------------------------------------------------------------------------------------------------------------------------------------------------------------------------------  NUTRITION: -eat real food: lots of colorful vegetables (half the plate) and fruits -5-7 servings of vegetables and fruits per day (fresh or steamed is best), exp. 2 servings of vegetables with lunch and dinner and 2 servings of fruit per day. Berries and greens such as kale and collards are great choices.  -consume on a regular basis: whole grains (make sure first ingredient on label contains the word "whole"), fresh fruits, fish, nuts, seeds, healthy oils (such as olive oil, avocado oil, grape seed oil) -may eat small amounts of dairy and lean meat on occasion, but avoid processed meats such as ham, bacon, lunch meat, etc. -drink water -try to avoid fast food and pre-packaged foods, processed meat -most experts advise limiting sodium to < '2300mg'$  per day, should limit further is any chronic conditions such as high blood pressure, heart disease, diabetes, etc. The American Heart Association  advised that < '1500mg'$  is is ideal -try to avoid foods that contain any ingredients with names you do not recognize  -try to avoid sugar/sweets (except for the natural sugar that occurs in fresh fruit) -try to avoid sweet drinks -try to avoid white rice, white bread, pasta (unless whole grain), white or yellow potatoes  EXERCISE GUIDELINES FOR ADULTS: -if you wish to increase your physical activity, do so gradually and with the approval of your doctor -STOP and seek medical care immediately if you have any chest pain, chest discomfort or trouble breathing when starting or increasing exercise  -move and stretch your body, legs, feet and arms when sitting for long periods -Physical activity guidelines for optimal health in adults: -least 150 minutes per week of aerobic exercise (can talk, but not sing) once approved by your doctor, 20-30 minutes of sustained activity or two 10 minute episodes of sustained activity every day.  -resistance training at least 2 days per week if approved by your doctor -balance exercises 3+ days per week:   Stand somewhere where you have something sturdy to hold onto if you lose balance.    1) lift up on toes, start with 5x per day and work up to 20x   2) stand and lift on leg straight out to the side so that foot is a few inches of the floor, start  with 5x each side and work up to 20x each side   3) stand on one foot, start with 5 seconds each side and work up to 20 seconds on each side  If you need ideas or help with getting more active:  -Silver sneakers https://tools.silversneakers.com  -Walk with a Doc: http://stephens-thompson.biz/  -try to include resistance (weight lifting/strength building) and balance exercises twice per week: or the following link for ideas: ChessContest.fr  UpdateClothing.com.cy  STRESS MANAGEMENT: -can try meditating, or just sitting quietly  with deep breathing while intentionally relaxing all parts of your body for 5 minutes daily -if you need further help with stress, anxiety or depression please follow up with your primary doctor or contact the wonderful folks at [place]: [place]: -options in [place] if you wish to engage in more social and exercise related activities:  -Silver sneakers https://tools.silversneakers.com  -Walk with a Doc: http://stephens-thompson.biz/  -Check out the [place] 50+ section on the [place] of [employer] (hiking clubs, book clubs, cards and games, chess, exercise classes, aquatic classes and much more) - see the website for details: https://www.[place].gov/departments/parks-recreation/active-adults50  -YouTube has lots of exercise videos for different ages and abilities as well  -[place] (a variety of indoor and outdoor inperson activities for adults). [phone]. [address].  -Virtual Online Classes (a variety of topics): see seniorplanet.org or call [phone]  -consider volunteering at a school, hospice center, church, senior center or elsewhere           Lucretia Kern, DO

## 2022-10-15 NOTE — Patient Instructions (Signed)
I really enjoyed getting to talk with you today! I am available on Tuesdays and Thursdays for virtual visits if you have any questions or concerns, or if I can be of any further assistance.   CHECKLIST FROM ANNUAL WELLNESS VISIT:  -Follow up (please call to schedule if not scheduled after visit):  -Inperson visit with your Primary Doctor office: -yearly for annual wellness visit with primary care office  Here is a list of your preventive care/health maintenance measures and the plan for each if any are due:  Health Maintenance  Topic Date Due   COVID-19 Vaccine (6 - 2023-24 season) 04/04/2023 (Originally 07/31/2022)   Medicare Annual Wellness (AWV)  10/15/2023   DTaP/Tdap/Td (4 - Td or Tdap) 08/24/2028   COLONOSCOPY (Pts 45-54yr Insurance coverage will need to be confirmed)  04/22/2031   Pneumonia Vaccine 77 Years old  Completed   INFLUENZA VACCINE  Completed   Hepatitis C Screening  Completed   Zoster Vaccines- Shingrix  Completed   HPV VACCINES  Aged Out    -See a dentist at least yearly  -Get your eyes checked and then per your eye specialist's recommendations  -Other issues addressed today: -  -I have included below further information regarding a healthy whole foods based diet, physical activity guidelines for adults, stress management and opportunities for social connections. I hope you find this information useful.   -----------------------------------------------------------------------------------------------------------------------------------------------------------------------------------------------------------------------------------------------------------  NUTRITION: -eat real food: lots of colorful vegetables (half the plate) and fruits -5-7 servings of vegetables and fruits per day (fresh or steamed is best), exp. 2 servings of vegetables with lunch and dinner and 2 servings of fruit per day. Berries and greens such as kale and collards are great choices.   -consume on a regular basis: whole grains (make sure first ingredient on label contains the word "whole"), fresh fruits, fish, nuts, seeds, healthy oils (such as olive oil, avocado oil, grape seed oil) -may eat small amounts of dairy and lean meat on occasion, but avoid processed meats such as ham, bacon, lunch meat, etc. -drink water -try to avoid fast food and pre-packaged foods, processed meat -most experts advise limiting sodium to < '2300mg'$  per day, should limit further is any chronic conditions such as high blood pressure, heart disease, diabetes, etc. The American Heart Association advised that < '1500mg'$  is is ideal -try to avoid foods that contain any ingredients with names you do not recognize  -try to avoid sugar/sweets (except for the natural sugar that occurs in fresh fruit) -try to avoid sweet drinks -try to avoid white rice, white bread, pasta (unless whole grain), white or yellow potatoes  EXERCISE GUIDELINES FOR ADULTS: -if you wish to increase your physical activity, do so gradually and with the approval of your doctor -STOP and seek medical care immediately if you have any chest pain, chest discomfort or trouble breathing when starting or increasing exercise  -move and stretch your body, legs, feet and arms when sitting for long periods -Physical activity guidelines for optimal health in adults: -least 150 minutes per week of aerobic exercise (can talk, but not sing) once approved by your doctor, 20-30 minutes of sustained activity or two 10 minute episodes of sustained activity every day.  -resistance training at least 2 days per week if approved by your doctor -balance exercises 3+ days per week:   Stand somewhere where you have something sturdy to hold onto if you lose balance.    1) lift up on toes, start with 5x per day and work  up to 20x   2) stand and lift on leg straight out to the side so that foot is a few inches of the floor, start with 5x each side and work up to 20x  each side   3) stand on one foot, start with 5 seconds each side and work up to 20 seconds on each side  If you need ideas or help with getting more active:  -Silver sneakers https://tools.silversneakers.com  -Walk with a Doc: http://stephens-thompson.biz/  -try to include resistance (weight lifting/strength building) and balance exercises twice per week: or the following link for ideas: ChessContest.fr  UpdateClothing.com.cy  STRESS MANAGEMENT: -can try meditating, or just sitting quietly with deep breathing while intentionally relaxing all parts of your body for 5 minutes daily -if you need further help with stress, anxiety or depression please follow up with your primary doctor or contact the wonderful folks at [place]: [place]: -options in [place] if you wish to engage in more social and exercise related activities:  -Silver sneakers https://tools.silversneakers.com  -Walk with a Doc: http://stephens-thompson.biz/  -Check out the [place] 50+ section on the [place] of [employer] (hiking clubs, book clubs, cards and games, chess, exercise classes, aquatic classes and much more) - see the website for details: https://www.[place].gov/departments/parks-recreation/active-adults50  -YouTube has lots of exercise videos for different ages and abilities as well  -[place] (a variety of indoor and outdoor inperson activities for adults). [phone]. [address]  -Virtual Online Classes (a variety of topics): see seniorplanet.org or call [phone]  -consider volunteering at a school, hospice center, church, senior center or elsewhere

## 2022-11-03 ENCOUNTER — Other Ambulatory Visit: Payer: Medicare HMO

## 2022-11-03 DIAGNOSIS — E039 Hypothyroidism, unspecified: Secondary | ICD-10-CM

## 2022-11-03 LAB — TSH: TSH: 6.1 u[IU]/mL — ABNORMAL HIGH (ref 0.35–5.50)

## 2022-11-03 NOTE — Addendum Note (Signed)
Addended by: Rosalyn Gess D on: 11/03/2022 08:48 AM   Modules accepted: Orders

## 2022-11-04 MED ORDER — LEVOTHYROXINE SODIUM 75 MCG PO TABS: ORAL_TABLET | ORAL | 3 refills | Status: DC

## 2022-11-09 ENCOUNTER — Ambulatory Visit (INDEPENDENT_AMBULATORY_CARE_PROVIDER_SITE_OTHER): Payer: Medicare HMO

## 2022-11-09 ENCOUNTER — Telehealth: Payer: Self-pay | Admitting: Internal Medicine

## 2022-11-09 ENCOUNTER — Encounter: Payer: Self-pay | Admitting: Internal Medicine

## 2022-11-09 ENCOUNTER — Ambulatory Visit: Payer: Medicare HMO | Admitting: Internal Medicine

## 2022-11-09 VITALS — BP 132/70 | HR 88 | Ht 71.0 in | Wt 235.0 lb

## 2022-11-09 DIAGNOSIS — J439 Emphysema, unspecified: Secondary | ICD-10-CM | POA: Diagnosis not present

## 2022-11-09 DIAGNOSIS — J441 Chronic obstructive pulmonary disease with (acute) exacerbation: Secondary | ICD-10-CM

## 2022-11-09 DIAGNOSIS — G4733 Obstructive sleep apnea (adult) (pediatric): Secondary | ICD-10-CM | POA: Diagnosis not present

## 2022-11-09 DIAGNOSIS — J449 Chronic obstructive pulmonary disease, unspecified: Secondary | ICD-10-CM | POA: Diagnosis not present

## 2022-11-09 MED ORDER — DOXYCYCLINE HYCLATE 100 MG PO TABS: 100.0000 mg | ORAL_TABLET | Freq: Two times a day (BID) | ORAL | 0 refills | Status: DC

## 2022-11-09 MED ORDER — BENZONATATE 200 MG PO CAPS: 200.0000 mg | ORAL_CAPSULE | Freq: Three times a day (TID) | ORAL | 1 refills | Status: DC | PRN

## 2022-11-09 MED ORDER — METHYLPREDNISOLONE ACETATE 80 MG/ML IJ SUSP
80.0000 mg | Freq: Once | INTRAMUSCULAR | Status: AC
Start: 2022-11-09 — End: 2022-11-09
  Administered 2022-11-09: 80 mg via INTRAMUSCULAR

## 2022-11-09 NOTE — Progress Notes (Signed)
Subjective:    Patient ID: Kyle Kyle [name], male    DOB: 1946/01/08, 77 y.o.   MRN: [id number]  HPI   male former smoker followed for OSA, COPD GOLD III, Rhinitis Complicated by GERD, CAD/MI, HBP Unattended Home Sleep Test-01/05/2014, moderate OSA, AHI 19/hour, weight 223 pounds BIPAP titration 03/18/18- Office Spirometry 09/10/2016-severe obstructive airways disease: FVC 2.83/61%, FEV1 1.17/34%, ratio 0.41, FEF 25-75% 0.35/13% PFT 09/22/16-severe COPD, no response to dilator, diffusion moderately reduced, air trapping, hyperinflation. FVC 3.54/75%, FEV1 1.7 2/49%, ratio 0.49, TLC 123%, DLCO 56% O2 Qualifying Walk Test 01/17/21- 3 laps x 250 ft, lowest sat 94%, max HR 83. Nurse reports "good steady pace, able to talk as well". ----------------------------------------------------------------- 05/25/22- 77 year old male former smoker followed for OSA, COPD, rhinitis complicated by GERD, CAD/MI, HBP, Covid infection/ Paxlovid Kyle [name],  BIPAP 15/11/ PS 3 Kyle Kyle [name]   BIPAP ordered 04/2018 -Trelegy 100, albuterol hfa Covid vax- [employer] Body weight today-240 lbs Download- compliance not available Flu vax-had -----Pt is doing well He likes his BIPAP machine and sleeps well with it, reporting use every night. COPD stable without exacerbation. Needs inhaler refills. He asks about RSV and updated Covid vaccines- discussed.  11/09/22-  77 year old male former smoker followed for OSA, COPD, rhinitis complicated by GERD, CAD/MI, HBP, Covid infection/ Paxlovid Kyle [name], [name]- BIPAP 15/12/ PS 3 Kyle Kyle [name]   BIPAP ordered 04/2018   Kyle Kyle [name] -Trelegy 100, albuterol hfa Covid vax- 4[employer]izer Body weight today-235 lbs Download- compliance  100%, AHI 4.6/ hr Download reviewed.  He feels he sleeps well with his BiPAP. He and his wife had COVID infection early March.  He got well with Paxlovid but then caught what he thinks is a routine cold late in March with with bad cough.  He does have a history of mild  seasonal rhinitis.  Now using OTC cold and sinus preparations.  Sputum is clear and he denies fever/chills.  ROS-see HPI   + = positive Constitutional:    weight loss, night sweats, fevers, chills, fatigue, lassitude. HEENT:    headaches, difficulty swallowing, tooth/dental problems, sore throat,       sneezing, itching, ear ache, nasal congestion, post nasal drip, snoring CV:    chest pain, orthopnea, PND, swelling in lower extremities, anasarca,                                                           dizziness, palpitations Resp:   +shortness of breath with exertion or at rest.                +productive cough,   non-productive cough, coughing up of blood.              change in color of mucus.  +wheezing.   Skin:    rash or lesions. GI:  No-   heartburn, indigestion, abdominal pain, nausea, vomiting, diarrhea,                 change in bowel habits, loss of appetite GU: dysuria, change in color of urine, no urgency or frequency.   flank pain. MS:   joint pain, stiffness, decreased range of motion, back pain. Neuro-     nothing unusual Psych:  change in mood or affect.  depression or anxiety.   memory loss.  Objective:  OBJ- Physical Exam General- Alert, Oriented, Affect-appropriate, Distress- none acute, + overweight Skin- rash-none, lesions- none, excoriation- none Lymphadenopathy- none Head- atraumatic            Eyes- Gross vision intact, PERRLA, conjunctivae and secretions clear            Ears- Hearing, canals-normal            Nose- Clear, no-Septal dev, mucus, polyps, erosion, perforation,              Throat- Mallampati III , mucosa clear , drainage- none, tonsils- atrophic Neck- flexible , trachea midline, no stridor , thyroid nl, carotid no bruit Chest - symmetrical excursion , unlabored           Heart/CV- RRR , no murmur , no gallop  , no rub, nl s1 s2                           - JVD- none , edema- none, stasis changes- none, varices- none           Lung- +  unlabored, +Clear/ diminished, wheeze- none, cough+ light , dullness-none, rub- none           Chest wall-  Abd-  Br/ Gen/ Rectal- Not done, not indicated Extrem- cyanosis- none, clubbing, none, atrophy- none, strength- nl Neuro- grossly intact to observation  Assessment & Plan:

## 2022-11-09 NOTE — Patient Instructions (Addendum)
Order- CXR    dx COPD exacerbation  Order- depo 80    dx COPD exacerbation  Script sent for doxycycline antibiotic  Script sent for tessalon perles  for cough if needed  Ok to continue any of your over the counter meds that seem helpful.   Stay well hydrated  Keep Oct 24 appointment unless you need help sooner.

## 2022-11-09 NOTE — Telephone Encounter (Signed)
Received a call from pt's spouse wanting to see if pt could be seen for an appt. Scheduled appt toady 4/8 at 3pm with Dr. Maple Hudson.

## 2022-11-10 ENCOUNTER — Ambulatory Visit: Payer: Medicare HMO | Admitting: Family

## 2022-12-10 ENCOUNTER — Encounter: Payer: Self-pay | Admitting: Internal Medicine

## 2022-12-10 NOTE — Assessment & Plan Note (Signed)
Recent exacerbation attributed to acute URI. Plan-Tessalon, chest x-ray, doxycycline, Depo 80

## 2022-12-10 NOTE — Assessment & Plan Note (Signed)
He benefits from his BiPAP with good compliance and control Plan-continue BiPAP 15/12

## 2022-12-26 ENCOUNTER — Other Ambulatory Visit: Payer: Self-pay | Admitting: Family Medicine

## 2022-12-26 DIAGNOSIS — E039 Hypothyroidism, unspecified: Secondary | ICD-10-CM

## 2022-12-31 ENCOUNTER — Other Ambulatory Visit (HOSPITAL_BASED_OUTPATIENT_CLINIC_OR_DEPARTMENT_OTHER): Payer: Self-pay

## 2022-12-31 ENCOUNTER — Other Ambulatory Visit: Payer: Self-pay | Admitting: Internal Medicine

## 2022-12-31 ENCOUNTER — Other Ambulatory Visit: Payer: Self-pay | Admitting: Cardiology

## 2022-12-31 ENCOUNTER — Other Ambulatory Visit: Payer: Self-pay | Admitting: Gastroenterology

## 2022-12-31 ENCOUNTER — Other Ambulatory Visit: Payer: Self-pay | Admitting: Family

## 2022-12-31 MED ORDER — TRELEGY ELLIPTA 100-62.5-25 MCG/ACT IN AEPB
1.0000 | INHALATION_SPRAY | Freq: Every day | RESPIRATORY_TRACT | 11 refills | Status: DC
  Filled 2022-12-31: qty 60, 30d supply, fill #0

## 2022-12-31 MED ORDER — ALBUTEROL SULFATE HFA 108 (90 BASE) MCG/ACT IN AERS
2.0000 | INHALATION_SPRAY | Freq: Four times a day (QID) | RESPIRATORY_TRACT | 11 refills | Status: DC | PRN
  Filled 2022-12-31: qty 6.7, 25d supply, fill #0

## 2022-12-31 MED ORDER — ATORVASTATIN CALCIUM 40 MG PO TABS
40.0000 mg | ORAL_TABLET | Freq: Every evening | ORAL | 3 refills | Status: DC
  Filled 2022-12-31: qty 90, 90d supply, fill #0

## 2022-12-31 MED ORDER — BENAZEPRIL-HYDROCHLOROTHIAZIDE 20-25 MG PO TABS
0.5000 | ORAL_TABLET | Freq: Every day | ORAL | 3 refills | Status: DC
  Filled 2022-12-31: qty 45, 90d supply, fill #0

## 2023-01-04 ENCOUNTER — Other Ambulatory Visit (HOSPITAL_BASED_OUTPATIENT_CLINIC_OR_DEPARTMENT_OTHER): Payer: Self-pay

## 2023-01-04 ENCOUNTER — Other Ambulatory Visit (HOSPITAL_COMMUNITY): Payer: Self-pay

## 2023-02-16 DIAGNOSIS — G4733 Obstructive sleep apnea (adult) (pediatric): Secondary | ICD-10-CM | POA: Diagnosis not present

## 2023-03-08 ENCOUNTER — Encounter: Payer: Self-pay | Admitting: Family Medicine

## 2023-03-18 ENCOUNTER — Encounter (INDEPENDENT_AMBULATORY_CARE_PROVIDER_SITE_OTHER): Payer: Self-pay

## 2023-03-24 DIAGNOSIS — B0233 Zoster keratitis: Secondary | ICD-10-CM | POA: Diagnosis not present

## 2023-03-24 DIAGNOSIS — Z961 Presence of intraocular lens: Secondary | ICD-10-CM | POA: Diagnosis not present

## 2023-03-24 DIAGNOSIS — Z9889 Other specified postprocedural states: Secondary | ICD-10-CM | POA: Diagnosis not present

## 2023-03-24 DIAGNOSIS — H532 Diplopia: Secondary | ICD-10-CM | POA: Diagnosis not present

## 2023-03-26 ENCOUNTER — Other Ambulatory Visit: Payer: Self-pay | Admitting: Family Medicine

## 2023-03-26 DIAGNOSIS — E039 Hypothyroidism, unspecified: Secondary | ICD-10-CM

## 2023-04-04 ENCOUNTER — Emergency Department (HOSPITAL_COMMUNITY): Payer: Medicare HMO

## 2023-04-04 ENCOUNTER — Encounter: Payer: Self-pay | Admitting: Family Medicine

## 2023-04-04 ENCOUNTER — Other Ambulatory Visit: Payer: Self-pay

## 2023-04-04 ENCOUNTER — Encounter (HOSPITAL_COMMUNITY): Payer: Self-pay

## 2023-04-04 ENCOUNTER — Observation Stay (HOSPITAL_COMMUNITY)
Admission: EM | Admit: 2023-04-04 | Discharge: 2023-04-05 | Disposition: A | Discharge status: Home / Self Care | Payer: Medicare HMO | Attending: Family Medicine | Admitting: Family Medicine

## 2023-04-04 DIAGNOSIS — Z87891 Personal history of nicotine dependence: Secondary | ICD-10-CM | POA: Insufficient documentation

## 2023-04-04 DIAGNOSIS — J984 Other disorders of lung: Secondary | ICD-10-CM | POA: Diagnosis not present

## 2023-04-04 DIAGNOSIS — Z1152 Encounter for screening for COVID-19: Secondary | ICD-10-CM | POA: Insufficient documentation

## 2023-04-04 DIAGNOSIS — I723 Aneurysm of iliac artery: Secondary | ICD-10-CM | POA: Diagnosis not present

## 2023-04-04 DIAGNOSIS — R0602 Shortness of breath: Secondary | ICD-10-CM | POA: Diagnosis not present

## 2023-04-04 DIAGNOSIS — I110 Hypertensive heart disease with heart failure: Secondary | ICD-10-CM | POA: Diagnosis not present

## 2023-04-04 DIAGNOSIS — N289 Disorder of kidney and ureter, unspecified: Secondary | ICD-10-CM | POA: Diagnosis not present

## 2023-04-04 DIAGNOSIS — G4733 Obstructive sleep apnea (adult) (pediatric): Secondary | ICD-10-CM | POA: Diagnosis present

## 2023-04-04 DIAGNOSIS — I5020 Unspecified systolic (congestive) heart failure: Secondary | ICD-10-CM | POA: Diagnosis present

## 2023-04-04 DIAGNOSIS — I5022 Chronic systolic (congestive) heart failure: Secondary | ICD-10-CM

## 2023-04-04 DIAGNOSIS — J449 Chronic obstructive pulmonary disease, unspecified: Secondary | ICD-10-CM | POA: Diagnosis not present

## 2023-04-04 DIAGNOSIS — I714 Abdominal aortic aneurysm, without rupture, unspecified: Secondary | ICD-10-CM | POA: Diagnosis not present

## 2023-04-04 DIAGNOSIS — I7121 Aneurysm of the ascending aorta, without rupture: Secondary | ICD-10-CM | POA: Diagnosis not present

## 2023-04-04 DIAGNOSIS — E039 Hypothyroidism, unspecified: Secondary | ICD-10-CM | POA: Diagnosis not present

## 2023-04-04 DIAGNOSIS — I7143 Infrarenal abdominal aortic aneurysm, without rupture: Secondary | ICD-10-CM | POA: Diagnosis not present

## 2023-04-04 DIAGNOSIS — E785 Hyperlipidemia, unspecified: Secondary | ICD-10-CM | POA: Diagnosis present

## 2023-04-04 DIAGNOSIS — I701 Atherosclerosis of renal artery: Secondary | ICD-10-CM | POA: Diagnosis not present

## 2023-04-04 DIAGNOSIS — Z743 Need for continuous supervision: Secondary | ICD-10-CM | POA: Diagnosis not present

## 2023-04-04 DIAGNOSIS — R079 Chest pain, unspecified: Secondary | ICD-10-CM | POA: Diagnosis not present

## 2023-04-04 DIAGNOSIS — Z955 Presence of coronary angioplasty implant and graft: Secondary | ICD-10-CM | POA: Diagnosis not present

## 2023-04-04 DIAGNOSIS — E7800 Pure hypercholesterolemia, unspecified: Secondary | ICD-10-CM | POA: Diagnosis not present

## 2023-04-04 DIAGNOSIS — Z7982 Long term (current) use of aspirin: Secondary | ICD-10-CM | POA: Insufficient documentation

## 2023-04-04 DIAGNOSIS — R059 Cough, unspecified: Secondary | ICD-10-CM | POA: Diagnosis not present

## 2023-04-04 DIAGNOSIS — I2510 Atherosclerotic heart disease of native coronary artery without angina pectoris: Secondary | ICD-10-CM | POA: Diagnosis present

## 2023-04-04 DIAGNOSIS — Z79899 Other long term (current) drug therapy: Secondary | ICD-10-CM | POA: Insufficient documentation

## 2023-04-04 DIAGNOSIS — I10 Essential (primary) hypertension: Secondary | ICD-10-CM | POA: Diagnosis not present

## 2023-04-04 DIAGNOSIS — I2699 Other pulmonary embolism without acute cor pulmonale: Principal | ICD-10-CM | POA: Diagnosis present

## 2023-04-04 LAB — CBC
HCT: 39.8 % (ref 39.0–52.0)
Hemoglobin: 13.3 g/dL (ref 13.0–17.0)
MCH: 33 pg (ref 26.0–34.0)
MCHC: 33.4 g/dL (ref 30.0–36.0)
MCV: 98.8 fL (ref 80.0–100.0)
Platelets: 144 10*3/uL — ABNORMAL LOW (ref 150–400)
RBC: 4.03 MIL/uL — ABNORMAL LOW (ref 4.22–5.81)
RDW: 11.4 % — ABNORMAL LOW (ref 11.5–15.5)
WBC: 9.7 10*3/uL (ref 4.0–10.5)
nRBC: 0 % (ref 0.0–0.2)

## 2023-04-04 LAB — HEPATIC FUNCTION PANEL
ALT: 34 U/L (ref 0–44)
AST: 29 U/L (ref 15–41)
Albumin: 3.4 g/dL — ABNORMAL LOW (ref 3.5–5.0)
Alkaline Phosphatase: 71 U/L (ref 38–126)
Bilirubin, Direct: 0.3 mg/dL — ABNORMAL HIGH (ref 0.0–0.2)
Indirect Bilirubin: 1.2 mg/dL — ABNORMAL HIGH (ref 0.3–0.9)
Total Bilirubin: 1.5 mg/dL — ABNORMAL HIGH (ref 0.3–1.2)
Total Protein: 6.8 g/dL (ref 6.5–8.1)

## 2023-04-04 LAB — LIPASE, BLOOD: Lipase: 23 U/L (ref 11–51)

## 2023-04-04 LAB — BRAIN NATRIURETIC PEPTIDE: B Natriuretic Peptide: 153.2 pg/mL — ABNORMAL HIGH (ref 0.0–100.0)

## 2023-04-04 LAB — TROPONIN I (HIGH SENSITIVITY)
Troponin I (High Sensitivity): 13 ng/L (ref <18)
Troponin I (High Sensitivity): 13 ng/L (ref <18)

## 2023-04-04 LAB — RESP PANEL BY RT-PCR (RSV, FLU A&B, COVID)  RVPGX2
Influenza A by PCR: NEGATIVE
Influenza B by PCR: NEGATIVE
Resp Syncytial Virus by PCR: NEGATIVE
SARS Coronavirus 2 by RT PCR: NEGATIVE

## 2023-04-04 LAB — BASIC METABOLIC PANEL
Anion gap: 11 (ref 5–15)
BUN: 16 mg/dL (ref 8–23)
CO2: 24 mmol/L (ref 22–32)
Calcium: 9.3 mg/dL (ref 8.9–10.3)
Chloride: 99 mmol/L (ref 98–111)
Creatinine, Ser: 1.26 mg/dL — ABNORMAL HIGH (ref 0.61–1.24)
GFR, Estimated: 59 mL/min — ABNORMAL LOW (ref >60)
Glucose, Bld: 106 mg/dL — ABNORMAL HIGH (ref 70–99)
Potassium: 3.9 mmol/L (ref 3.5–5.1)
Sodium: 134 mmol/L — ABNORMAL LOW (ref 135–145)

## 2023-04-04 LAB — HEPARIN LEVEL (UNFRACTIONATED): Heparin Unfractionated: 0.3 [IU]/mL (ref 0.30–0.70)

## 2023-04-04 LAB — TSH: TSH: <0.01 u[IU]/mL — ABNORMAL LOW (ref 0.350–4.500)

## 2023-04-04 LAB — LACTIC ACID, PLASMA: Lactic Acid, Venous: 1.3 mmol/L (ref 0.5–1.9)

## 2023-04-04 MED ORDER — HYDROCODONE-ACETAMINOPHEN 5-325 MG PO TABS: 1.0000 | ORAL_TABLET | Freq: Four times a day (QID) | ORAL | Status: DC | PRN

## 2023-04-04 MED ORDER — ATORVASTATIN CALCIUM 40 MG PO TABS
40.0000 mg | ORAL_TABLET | Freq: Every evening | ORAL | Status: DC
  Administered 2023-04-04: 40 mg via ORAL
  Filled 2023-04-04 (×2): qty 1

## 2023-04-04 MED ORDER — BENAZEPRIL-HYDROCHLOROTHIAZIDE 20-25 MG PO TABS: 0.5000 | ORAL_TABLET | Freq: Every day | ORAL | Status: DC

## 2023-04-04 MED ORDER — SODIUM CHLORIDE 0.9% FLUSH
3.0000 mL | Freq: Two times a day (BID) | INTRAVENOUS | Status: DC
  Administered 2023-04-04: 3 mL via INTRAVENOUS

## 2023-04-04 MED ORDER — HYDRALAZINE HCL 20 MG/ML IJ SOLN: 10.0000 mg | INTRAMUSCULAR | Status: DC | PRN

## 2023-04-04 MED ORDER — HEPARIN BOLUS VIA INFUSION
5500.0000 [IU] | Freq: Once | INTRAVENOUS | Status: AC
  Administered 2023-04-04: 5500 [IU] via INTRAVENOUS
  Filled 2023-04-04: qty 5500

## 2023-04-04 MED ORDER — FERROUS SULFATE 325 (65 FE) MG PO TABS
325.0000 mg | ORAL_TABLET | Freq: Every day | ORAL | Status: DC
  Administered 2023-04-05: 325 mg via ORAL
  Filled 2023-04-04: qty 1

## 2023-04-04 MED ORDER — LEVOTHYROXINE SODIUM 75 MCG PO TABS
75.0000 ug | ORAL_TABLET | Freq: Every day | ORAL | Status: DC
  Administered 2023-04-05: 75 ug via ORAL
  Filled 2023-04-04: qty 1

## 2023-04-04 MED ORDER — ACETAMINOPHEN 650 MG RE SUPP: 650.0000 mg | Freq: Four times a day (QID) | RECTAL | Status: DC | PRN

## 2023-04-04 MED ORDER — FLUTICASONE FUROATE-VILANTEROL 100-25 MCG/ACT IN AEPB
1.0000 | INHALATION_SPRAY | Freq: Every day | RESPIRATORY_TRACT | Status: DC
  Administered 2023-04-04: 1 via RESPIRATORY_TRACT
  Filled 2023-04-04: qty 28

## 2023-04-04 MED ORDER — ALBUTEROL SULFATE (2.5 MG/3ML) 0.083% IN NEBU: 2.5000 mg | INHALATION_SOLUTION | RESPIRATORY_TRACT | Status: DC | PRN

## 2023-04-04 MED ORDER — ACETAMINOPHEN 325 MG PO TABS
650.0000 mg | ORAL_TABLET | Freq: Four times a day (QID) | ORAL | Status: DC | PRN
  Administered 2023-04-04: 650 mg via ORAL
  Filled 2023-04-04: qty 2

## 2023-04-04 MED ORDER — HEPARIN (PORCINE) 25000 UT/250ML-% IV SOLN
1600.0000 [IU]/h | INTRAVENOUS | Status: DC
  Administered 2023-04-04: 1500 [IU]/h via INTRAVENOUS
  Administered 2023-04-05: 1600 [IU]/h via INTRAVENOUS
  Filled 2023-04-04 (×2): qty 250

## 2023-04-04 MED ORDER — LORATADINE 10 MG PO TABS
10.0000 mg | ORAL_TABLET | Freq: Every day | ORAL | Status: DC
  Administered 2023-04-04 – 2023-04-05 (×2): 10 mg via ORAL
  Filled 2023-04-04 (×2): qty 1

## 2023-04-04 MED ORDER — IOHEXOL 350 MG/ML SOLN
100.0000 mL | Freq: Once | INTRAVENOUS | Status: AC | PRN
  Administered 2023-04-04: 100 mL via INTRAVENOUS

## 2023-04-04 MED ORDER — UMECLIDINIUM BROMIDE 62.5 MCG/ACT IN AEPB
1.0000 | INHALATION_SPRAY | Freq: Every day | RESPIRATORY_TRACT | Status: DC
  Administered 2023-04-04: 1 via RESPIRATORY_TRACT
  Filled 2023-04-04: qty 7

## 2023-04-04 NOTE — Progress Notes (Signed)
Pt said his wife will bring a piece that is missing on his home cpap tomorrow. PT currenlly on our CPAP machine, tolerating well.  04/04/23 2206  BiPAP/CPAP/SIPAP  $ Non-Invasive Ventilator  Non-Invasive Vent Set Up  $ Face Mask Medium Yes  BiPAP/CPAP/SIPAP Pt Type Adult  BiPAP/CPAP/SIPAP Resmed  Mask Type Full face mask  Mask Size Medium  Respiratory Rate 18 breaths/min  PEEP  (max10 min 5)  FiO2 (%) 21 %  Patient Home Equipment No  Auto Titrate No  CPAP/SIPAP surface wiped down Yes  Safety Check Completed by RT for Home Unit Yes, no issues noted

## 2023-04-04 NOTE — H&P (Addendum)
History and Physical    Patient: Kyle [name]:[id number] DOB: [birth year] DOA: 04/04/2023 DOS: the patient was seen and examined on 04/04/2023 PCP: Kristian Covey, MD  Patient coming from: Home  Chief Complaint:  Chief Complaint  Patient presents with   Chest Pain   HPI: Kyle [name] is a 77 y.o. male with medical history significant of hypertension, hyperlipidemia, CAD, COPD, hypothyroidism, and AAA who presented with complaints of chest pain starting yesterday.  Pain was pleuritic and located on the right side of his chest.  His wife had reported that he had some swelling of his left foot a couple weeks ago that went away.  At baseline patient had been very active going to the gym 3 days a week and walking regularly.  He also reports having decreased p.o. intake.  Denies any recent travel, leg pain, fever, abdominal pain,  back pain, nausea, vomiting, diarrhea, blood in stool, or dysuria.  He had previously been followed by Dr. Darrick Penna of vascular surgery for AAA until he retired.  His last office visit was back in 2022.    Wife says patient is mostly sedentary doing jigsaw puzzles for most of the day where he can sit for long periods of time.  He goes to the gym 3 times a week for a hours or so and does routinely walk daily.  She had noticed that he had been more short of breath over the last couple weeks as well as been having some issues with his memory.  In the emergency department patient was noted to be afebrile with tachypnea and all other vital signs relatively maintained.  Labs noted platelet count 144, sodium 134, BNP 16, creatinine 1.26, lactic acid 1.3, BNP 153.2, and high-sensitivity troponins negative x 2.  Chest x-ray had noted patchy bibasilar airspace disease.  Influenza, COVID-19, and RSV screening were negative.  Subsequent CT angiogram of the chest abdomen pelvis was obtained which noted isolated occlusive pulmonary embolism within the segmental branch of the right  middle lobe pulmonary artery and associated likely infarction as well as a enlarging infrarenal abdominal aortic aneurysm now 5.8 cm when compared to 5.2 cm 1 year previously.  Review of Systems: As mentioned in the history of present illness. All other systems reviewed and are negative. Past Medical History:  Diagnosis Date   Anemia    CAD (coronary artery disease) 08/03/2009   Cardiac arrhythmia    COPD (chronic obstructive pulmonary disease) (HCC)    GERD (gastroesophageal reflux disease)    Hyperlipidemia    Hypertension    Hypothyroidism    Myocardial infarction (HCC) 08/03/2009   Sleep apnea treated with nocturnal BiPAP    Past Surgical History:  Procedure Laterality Date   APPENDECTOMY     CARDIAC CATHETERIZATION N/A 05/05/2016   Procedure: Left Heart Cath and Coronary Angiography;  Surgeon: Jake Bathe, MD;  Location: [facility];  Service: Cardiovascular;  Laterality: N/A;   CORONARY ANGIOPLASTY WITH STENT PLACEMENT     EYE SURGERY     lasix /BIL   TONSILLECTOMY     Social History:  reports that he quit smoking about 29 years ago. His smoking use included cigarettes. He started smoking about 57 years ago. He has a 112 pack-year smoking history. He has never used smokeless tobacco. He reports current alcohol use of about 5.0 standard drinks of alcohol per week. He reports that he does not use drugs.  No Known Allergies  Family History  Problem Relation  Age of Onset   Stroke Mother    Hypertension Mother    Stroke Father    Hypotension Father    Multiple sclerosis Sister    Thyroid disease Brother     Prior to Admission medications   Medication Sig Start Date End Date Taking? Authorizing Provider  albuterol (VENTOLIN HFA) 108 (90 Base) MCG/ACT inhaler Inhale 2 puffs into the lungs every 6 (six) hours as needed for wheezing or shortness of breath. 12/31/22   Jetty Duhamel D, MD  aspirin 81 MG tablet Take 81 mg by mouth daily.    [provider]   atorvastatin (LIPITOR) 40 MG tablet Take 1 tablet (40 mg total) by mouth every evening at 6 PM. 12/31/22   Jake Bathe, MD  benazepril-hydrochlorthiazide (LOTENSIN HCT) 20-25 MG tablet Take 1/2 (one half) tablet by mouth daily. 12/31/22   Jake Bathe, MD  benzonatate (TESSALON PERLES) 100 MG capsule Take 1-2 capsules (100-200 mg total) by mouth up to 2 (two) times daily as needed for cough. 10/08/22   Terressa Koyanagi, DO  benzonatate (TESSALON) 200 MG capsule Take 1 capsule (200 mg total) by mouth 3 (three) times daily as needed for cough. 11/09/22   Waymon Budge, MD  cetirizine (ZYRTEC) 10 MG tablet Take 10 mg by mouth daily.    [provider]  Cyanocobalamin (B-12 PO) Take 1 tablet by mouth daily.    [provider]  doxycycline (VIBRA-TABS) 100 MG tablet Take 1 tablet (100 mg total) by mouth 2 (two) times daily. 11/09/22   Waymon Budge, MD  FEROSUL 325 (65 Fe) MG tablet TAKE ONE TABLET BY MOUTH ONE TIME DAILY WITH BREAKFAST 04/27/22   Jenel Lucks, MD  Fluticasone-Umeclidin-Vilant (TRELEGY ELLIPTA) 100-62.5-25 MCG/ACT AEPB Inhale 1 puff into the lungs daily. 12/31/22   Waymon Budge, MD  levothyroxine (SYNTHROID) 75 MCG tablet Take 1 tablet (75 mcg total) by mouth once daily. 11/04/22   Worthy Rancher B, FNP  Multiple Vitamin (MULTIVITAMIN) tablet Take 1 tablet by mouth daily.    [provider]  valACYclovir (VALTREX) 1000 MG tablet 1 g. 08/27/17   [provider]    Physical Exam: Vitals:   04/04/23 0900 04/04/23 0915 04/04/23 1015 04/04/23 1130  BP: (!) 137/57 126/62 139/72 128/62  Pulse: 89 91 88 90  Resp: (!) 27 (!) 22 (!) 26 (!) 29  Temp:    98.7 F (37.1 C)  TempSrc:    Oral  SpO2: 96% 96% 96% 95%  Weight:      Height:        Constitutional: Elderly male currently in no acute distress Eyes: PERRL, lids and conjunctivae normal ENMT: Mucous membranes are moist.  Fair dentition Neck: normal, supple.  Respiratory: Decreased  aeration, but otherwise clear to auscultation. Cardiovascular: Regular rate and rhythm, no murmurs / rubs / gallops. No extremity edema. 2+ pedal pulses.   Abdomen: no tenderness to palpation.  Bowel sounds positive.  Musculoskeletal: no clubbing / cyanosis. No joint deformity upper and lower extremities. Good ROM, no contractures. Normal muscle tone.  Skin: no rashes, lesions, ulcers. No induration Neurologic: CN 2-12 grossly intact.  Strength 5/5 in all 4.  Psychiatric: Normal judgment and insight. Alert and oriented x 3. Normal mood.   Data Reviewed:  Reviewed labs, imaging, and pertinent records as noted in the document  Assessment and Plan:  Pulmonary embolism with infarction Acute.  Patient presents with complaints of right-sided chest pain and shortness of  breath that started yesterday.  CT angiogram concerning for pulmonary embolism because of pulmonary embolism within the right middle lobe of the pulmonary artery with concern for pulmonary infarction.  Based off wife's report of patient sitting for prolonged periods of time this could possibly be provoked. -Admit to a telemetry bed -Continuous pulse oximetry with oxygen as needed to maintain O2 saturation greater than 92%. -Incentive spirometry -Strict bedrest for at least 24 hours unless ultrasound of the lower extremity negative -Heparin drip per pharmacy.  Plan would be to transition him to oral agent -Hold aspirin due to being on anticoagulation -Check echocardiogram and Doppler ultrasound of lower extremity -Hydrocodone as needed for pain -Consider need of referral to hematology oncology in the outpatient setting  Infrarenal AAA Acute on chronic.  CT angiogram noted patient's infrarenal abdominal aortic aneurysm to have enlarged from 5.2 cm to 5.8 cm from prior study.  Previously followed by Dr. Darrick Penna, but had last been seen in 01/2021. -Discussed with patient need to avoid heavy lifting and doing strenuous abdominal  exercises -Follow-up vascular -Dr. Randie Heinz of vascular surgery consulted   Essential hypertension Home blood pressure regimen includes benazepril hydrochlorothiazide. -Held benazepril hydrochlorothiazide initially due to renal insufficiency.  Consider resuming when medically appropriate -Hydralazine IV as needed  CAD Patient with history of inferior RCA ST elevation MI back in 2011 requiring stent placement.  Patient followed by Dr. Anne Fu with last cardiac catheterization back in 2017 that noted patent stent of the right RCA.. -Hold asa while on anticoagulation -Continue statin  Heart failure with mildly reduced EF Chronic.  Patient does not appear grossly fluid overloaded on physical exam. -Check BNP  COPD, without acute exacerbation At baseline patient is not on oxygen. -Continue pharmacy substitution of Trelegy -Albuterol nebs as needed for shortness of breath/wheezing  Renal insufficiency   Creatinine 1.26 which appears slightly higher than previous baseline of around 1. -Avoiding possible nephrotoxic agents due to recent IV contrast given -Recheck kidney function in a.m.  Hyperlipidemia -Continue atorvastatin  Hypothyroidism -Check TSH -Continue levothyroxine.  Consider adjusting dose if needed  OSA -Continue CPAP at night  Overweight BMI 29.29 kg/m  DVT prophylaxis: Heparin Advance Care Planning:   Code Status: Full Code    Consults: Vascular surgery  Family Communication: Patient's wife updated over the phone  Severity of Illness: The appropriate patient status for this patient is OBSERVATION. Observation status is judged to be reasonable and necessary in order to provide the required intensity of service to ensure the patient's safety. The patient's presenting symptoms, physical exam findings, and initial radiographic and laboratory data in the context of their medical condition is felt to place them at decreased risk for further clinical deterioration.  Furthermore, it is anticipated that the patient will be medically stable for discharge from the hospital within 2 midnights of admission.   Author: Clydie Braun, MD 04/04/2023 12:16 PM  For on call review [email or web address].

## 2023-04-04 NOTE — Consult Note (Signed)
Hospital Consult    Reason for Consult: Abdominal aortic aneurysm Referring Physician: Dr. Katrinka Blazing MRN #:  [id number]  History of Present Illness: This is a 77 y.o. male no history of abdominal arctic aneurysm last followed in our office with Dr. Darrick Penna in 2022.  He has a family history of aneurysm in his sister that has not been repaired.  He is now admitted with right-sided pleuritic chest pain since Saturday found to have pulmonary embolus.  He has actually been quite active and lost nearly 20 pounds in the past several months with exercise and low-fat dieting.  He walks up to 1.5 miles daily but cannot walk although 1 time.  Does have a history of COPD does not wear oxygen.  He does not have any new back or abdominal pain.  Past Medical History:  Diagnosis Date   Anemia    CAD (coronary artery disease) 08/03/2009   Cardiac arrhythmia    COPD (chronic obstructive pulmonary disease) (HCC)    GERD (gastroesophageal reflux disease)    Hyperlipidemia    Hypertension    Hypothyroidism    Myocardial infarction (HCC) 08/03/2009   Sleep apnea treated with nocturnal BiPAP     Past Surgical History:  Procedure Laterality Date   APPENDECTOMY     CARDIAC CATHETERIZATION N/A 05/05/2016   Procedure: Left Heart Cath and Coronary Angiography;  Surgeon: Jake Bathe, MD;  Location: [facility];  Service: Cardiovascular;  Laterality: N/A;   CORONARY ANGIOPLASTY WITH STENT PLACEMENT     EYE SURGERY     lasix /BIL   TONSILLECTOMY      No Known Allergies  Prior to Admission medications   Medication Sig Start Date End Date Taking? Authorizing Provider  albuterol (VENTOLIN HFA) 108 (90 Base) MCG/ACT inhaler Inhale 2 puffs into the lungs every 6 (six) hours as needed for wheezing or shortness of breath. 12/31/22   Jetty Duhamel D, MD  aspirin 81 MG tablet Take 81 mg by mouth daily.    [provider]  atorvastatin (LIPITOR) 40 MG tablet Take 1 tablet (40 mg total) by mouth every  evening at 6 PM. 12/31/22   Jake Bathe, MD  benazepril-hydrochlorthiazide (LOTENSIN HCT) 20-25 MG tablet Take 1/2 (one half) tablet by mouth daily. 12/31/22   Jake Bathe, MD  benzonatate (TESSALON PERLES) 100 MG capsule Take 1-2 capsules (100-200 mg total) by mouth up to 2 (two) times daily as needed for cough. 10/08/22   Terressa Koyanagi, DO  benzonatate (TESSALON) 200 MG capsule Take 1 capsule (200 mg total) by mouth 3 (three) times daily as needed for cough. 11/09/22   Waymon Budge, MD  cetirizine (ZYRTEC) 10 MG tablet Take 10 mg by mouth daily.    [provider]  Cyanocobalamin (B-12 PO) Take 1 tablet by mouth daily.    [provider]  doxycycline (VIBRA-TABS) 100 MG tablet Take 1 tablet (100 mg total) by mouth 2 (two) times daily. 11/09/22   Waymon Budge, MD  FEROSUL 325 (65 Fe) MG tablet TAKE ONE TABLET BY MOUTH ONE TIME DAILY WITH BREAKFAST 04/27/22   Jenel Lucks, MD  Fluticasone-Umeclidin-Vilant (TRELEGY ELLIPTA) 100-62.5-25 MCG/ACT AEPB Inhale 1 puff into the lungs daily. 12/31/22   Waymon Budge, MD  levothyroxine (SYNTHROID) 75 MCG tablet Take 1 tablet (75 mcg total) by mouth once daily. 11/04/22   Worthy Rancher B, FNP  Multiple Vitamin (MULTIVITAMIN) tablet Take 1 tablet by mouth daily.    [provider]  valACYclovir (VALTREX) 1000 MG tablet 1 g. 08/27/17   [provider]    Social History   Socioeconomic History   Marital status: Married    Spouse name: Not on file   Number of children: 0   Years of education: Not on file   Highest education level: 12th grade  Occupational History   Occupation: retired  Tobacco Use   Smoking status: Former    Current packs/day: 0.00    Average packs/day: 4.0 packs/day for 28.0 years (112.0 ttl pk-yrs)    Types: Cigarettes    Start date: 12/10/1965    Quit date: 12/10/1993    Years since quitting: 29.3   Smokeless tobacco: Never  Vaping Use   Vaping status: Never Used  Substance and  Sexual Activity   Alcohol use: Yes    Alcohol/week: 5.0 standard drinks of alcohol    Types: 5 Standard drinks or equivalent per week    Comment: once every 2 months   Drug use: No   Sexual activity: Not on file  Other Topics Concern   Not on file  Social History Narrative   Not on file   Social Determinants of Health   Financial Resource Strain: Low Risk  (10/13/2021)   Overall Financial Resource Strain (CARDIA)    Difficulty of Paying Living Expenses: Not hard at all  Food Insecurity: No Food Insecurity (10/13/2021)   Hunger Vital Sign    Worried About Running Out of Food in the Last Year: Never true    Ran Out of Food in the Last Year: Never true  Transportation Needs: No Transportation Needs (10/13/2021)   PRAPARE - Administrator, Civil Service (Medical): No    Lack of Transportation (Non-Medical): No  Physical Activity: Sufficiently Active (10/13/2021)   Exercise Vital Sign    Days of Exercise per Week: 3 days    Minutes of Exercise per Session: 90 min  Recent Concern: Physical Activity - Inactive (09/19/2021)   Exercise Vital Sign    Days of Exercise per Week: 0 days    Minutes of Exercise per Session: 30 min  Stress: No Stress Concern Present (10/13/2021)   [employer] of Occupational Health - Occupational Stress Questionnaire    Feeling of Stress : Not at all  Social Connections: Socially Integrated (10/13/2021)   Social Connection and Isolation Panel [NHANES]    Frequency of Communication with Friends and Family: More than three times a week    Frequency of Social Gatherings with Friends and Family: More than three times a week    Attends Religious Services: More than 4 times per year    Active Member of [employer] or Organizations: Yes    Attends Banker Meetings: More than 4 times per year    Marital Status: Married  Catering manager Violence: Not At Risk (10/13/2021)   Humiliation, Afraid, Rape, and Kick questionnaire    Fear of Current or  Ex-Partner: No    Emotionally Abused: No    Physically Abused: No    Sexually Abused: No     Family History  Problem Relation Age of Onset   Stroke Mother    Hypertension Mother    Stroke Father    Hypotension Father    Multiple sclerosis Sister    Thyroid disease Brother     Review of Systems  Constitutional: Negative.   HENT: Negative.    Eyes: Negative.   Respiratory:  Positive for shortness of breath.  Cardiovascular:  Positive for chest pain.  Gastrointestinal: Negative.   Musculoskeletal: Negative.   Skin: Negative.   Neurological: Negative.   Endo/Heme/Allergies: Negative.   Psychiatric/Behavioral: Negative.        Physical Examination  Vitals:   04/04/23 1230 04/04/23 1245  BP: 126/66 134/63  Pulse: 88 89  Resp: 15 19  Temp:    SpO2: 94% 95%   Body mass index is 29.29 kg/m.  Physical Exam HENT:     Head: Normocephalic.  Cardiovascular:     Rate and Rhythm: Normal rate.     Pulses:          Radial pulses are 2+ on the right side and 2+ on the left side.       Dorsalis pedis pulses are 2+ on the right side and 2+ on the left side.     Comments: 3+ bilateral popliteal pulses Pulmonary:     Effort: Pulmonary effort is normal.  Abdominal:     Palpations: Abdomen is soft. There is no mass.  Musculoskeletal:        General: Normal range of motion.     Right lower leg: No edema.     Left lower leg: No edema.  Skin:    General: Skin is warm and dry.     Capillary Refill: Capillary refill takes less than 2 seconds.  Neurological:     General: No focal deficit present.     Mental Status: He is alert.  Psychiatric:        Mood and Affect: Mood normal.     CBC    Component Value Date/Time   WBC 9.7 04/04/2023 0713   RBC 4.03 (L) 04/04/2023 0713   HGB 13.3 04/04/2023 0713   HGB 14.5 09/07/2022 0826   HCT 39.8 04/04/2023 0713   HCT 41.9 09/07/2022 0826   PLT 144 (L) 04/04/2023 0713   PLT 154 09/07/2022 0826   MCV 98.8 04/04/2023 0713    MCV 98 (H) 09/07/2022 0826   MCH 33.0 04/04/2023 0713   MCHC 33.4 04/04/2023 0713   RDW 11.4 (L) 04/04/2023 0713   RDW 12.3 09/07/2022 0826   LYMPHSABS 1.3 09/07/2022 0826   MONOABS 0.6 01/17/2021 1132   EOSABS 0.3 09/07/2022 0826   BASOSABS 0.1 09/07/2022 0826    BMET    Component Value Date/Time   NA 134 (L) 04/04/2023 0713   NA 141 09/07/2022 0826   K 3.9 04/04/2023 0713   CL 99 04/04/2023 0713   CO2 24 04/04/2023 0713   GLUCOSE 106 (H) 04/04/2023 0713   BUN 16 04/04/2023 0713   BUN 16 09/07/2022 0826   CREATININE 1.26 (H) 04/04/2023 0713   CREATININE 1.00 04/27/2016 1020   CALCIUM 9.3 04/04/2023 0713   GFRNONAA 59 (L) 04/04/2023 0713   GFRAA  01/16/2010 0420    >60        The eGFR has been calculated using the MDRD equation. This calculation has not been validated in all clinical situations. eGFR's persistently <60 mL/min signify possible Chronic Kidney Disease.    COAGS: Lab Results  Component Value Date   INR 1.1 04/27/2016   INR 1.07 01/13/2010     Non-Invasive Vascular Imaging:   CTA chest abdomen pelvis 1. Positive for isolated occlusive pulmonary embolus within a segmental branch of the right middle lobe pulmonary artery. There is associated wedge-shaped pulmonary airspace opacity extending to the pleural surface consistent with a focus of infarct versus reperfusion injury. This likely only the patient's clinical  symptoms of pleuritic chest pain. 2. No evidence of acute aortic syndrome in the chest. 3. Enlarging infrarenal abdominal aortic aneurysm now measuring up to 5.8 cm compared to a maximum of 5.2 cm 1 year previously. Recommend referral to a vascular specialist. This recommendation follows ACR consensus guidelines: White Paper of the ACR Incidental Findings Committee II on Vascular Findings. J Am Coll Radiol 2013; 10:789-794. 4. Stable mild fusiform aneurysmal dilation of the ascending and proximal transverse thoracic aorta with a maximal  diameter of 4 cm. Recommend annual imaging followup by CTA or MRA. This recommendation follows 2010 ACCF/AHA/AATS/ACR/ASA/SCA/SCAI/SIR/STS/SVM Guidelines for the Diagnosis and Management of Patients with Thoracic Aortic Disease. Circulation. 2010; 121: J191-Y782. 5. Moderately severe combined paraseptal and centrilobular pulmonary emphysema. 6. Aortic and coronary artery atherosclerotic vascular calcifications. 7. 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 8. Mild to moderate stenosis of the proximal left renal artery. 9. Additional ancillary findings as above.  ASSESSMENT/PLAN: This is a 77 y.o. male with known history of abdominal aortic aneurysm with underlying COPD although not requiring oxygen.  Currently admitted with pulmonary embolus found to have 5.8 cm aneurysm.  I quoted him a 5 to 8% risk of rupture within the year.  We discussed his options for repair being open versus endovascular.  Given recent pulmonary embolus we will need anticoagulation for some time prior to holding for repair although likely not wait 6 months for him to complete anticoagulation prior to considering repair.  I reviewed the CT scan in depth appears to need some amount of renal revascularization and I will discuss with device representatives.    He will need cardiology and pulmonology clearance prior to surgical intervention.  I have ordered duplexes of his bilateral popliteal arteries to rule out popliteal aneurysms while he is here.  Abbigael Detlefsen C. Randie Heinz, MD [facility] Office: [phone] Pager: [phone]

## 2023-04-04 NOTE — ED Triage Notes (Signed)
Pt BIB EMS for right sided chest pain that started 36 hours ago. Pt states it hurts to raise right arm up and hurts with movement. Pt worked out yesterday and pain got progressively worse. 130 systolic. Axox4. VSS.

## 2023-04-04 NOTE — ED Provider Notes (Signed)
[place] EMERGENCY DEPARTMENT AT [facility] Provider Note   CSN: [id number] Arrival date & time: 04/04/23  0703     History  Chief Complaint  Patient presents with   Chest Pain    Kyle [name] is a 77 y.o. male.  The history is provided by the patient and medical records. No language interpreter was used.  Chest Pain Pain location:  R chest Pain quality: aching, dull and pressure   Pain radiates to:  Does not radiate Pain severity:  Moderate Onset quality:  Gradual Duration:  2 days Timing:  Constant Progression:  Waxing and waning Chronicity:  New Relieved by:  Nothing Worsened by:  Exertion and deep breathing Ineffective treatments:  None tried Associated symptoms: anorexia and shortness of breath   Associated symptoms: no abdominal pain, no anxiety, no back pain, no cough (possible), no diaphoresis, no fatigue, no fever, no headache, no lower extremity edema, no nausea, no syncope and no vomiting   Risk factors: aortic disease, coronary artery disease, hypertension, male sex and obesity        Home Medications Prior to Admission medications   Medication Sig Start Date End Date Taking? Authorizing Provider  albuterol (VENTOLIN HFA) 108 (90 Base) MCG/ACT inhaler Inhale 2 puffs into the lungs every 6 (six) hours as needed for wheezing or shortness of breath. 12/31/22   Jetty Duhamel D, MD  aspirin 81 MG tablet Take 81 mg by mouth daily.    [provider]  atorvastatin (LIPITOR) 40 MG tablet Take 1 tablet (40 mg total) by mouth every evening at 6 PM. 12/31/22   Jake Bathe, MD  benazepril-hydrochlorthiazide (LOTENSIN HCT) 20-25 MG tablet Take 1/2 (one half) tablet by mouth daily. 12/31/22   Jake Bathe, MD  benzonatate (TESSALON PERLES) 100 MG capsule Take 1-2 capsules (100-200 mg total) by mouth up to 2 (two) times daily as needed for cough. 10/08/22   Terressa Koyanagi, DO  benzonatate (TESSALON) 200 MG capsule Take 1 capsule (200 mg total) by  mouth 3 (three) times daily as needed for cough. 11/09/22   Waymon Budge, MD  cetirizine (ZYRTEC) 10 MG tablet Take 10 mg by mouth daily.    [provider]  Cyanocobalamin (B-12 PO) Take 1 tablet by mouth daily.    [provider]  doxycycline (VIBRA-TABS) 100 MG tablet Take 1 tablet (100 mg total) by mouth 2 (two) times daily. 11/09/22   Waymon Budge, MD  FEROSUL 325 (65 Fe) MG tablet TAKE ONE TABLET BY MOUTH ONE TIME DAILY WITH BREAKFAST 04/27/22   Jenel Lucks, MD  Fluticasone-Umeclidin-Vilant (TRELEGY ELLIPTA) 100-62.5-25 MCG/ACT AEPB Inhale 1 puff into the lungs daily. 12/31/22   Waymon Budge, MD  levothyroxine (SYNTHROID) 75 MCG tablet Take 1 tablet (75 mcg total) by mouth once daily. 11/04/22   Worthy Rancher B, FNP  Multiple Vitamin (MULTIVITAMIN) tablet Take 1 tablet by mouth daily.    [provider]  valACYclovir (VALTREX) 1000 MG tablet 1 g. 08/27/17   [provider]      Allergies    Patient has no known allergies.    Review of Systems   Review of Systems  Constitutional:  Negative for diaphoresis, fatigue and fever.  HENT:  Negative for congestion.   Respiratory:  Positive for chest tightness and shortness of breath. Negative for cough (possible) and wheezing.   Cardiovascular:  Positive for chest pain. Negative for leg swelling and syncope.  Gastrointestinal:  Positive  for anorexia. Negative for abdominal pain, constipation, diarrhea, nausea and vomiting.  Genitourinary:  Negative for dysuria and flank pain.  Musculoskeletal:  Negative for back pain and neck pain.  Skin:  Negative for rash.  Neurological:  Negative for light-headedness and headaches.  Psychiatric/Behavioral:  Negative for agitation.     Physical Exam Updated Vital Signs Ht 5\' 11"  (1.803 m)   Wt 95.3 kg   BMI 29.29 kg/m  Physical Exam Vitals and nursing note reviewed.  Constitutional:      General: He is not in acute distress.    Appearance: He is  well-developed. He is not ill-appearing, toxic-appearing or diaphoretic.  HENT:     Head: Normocephalic and atraumatic.  Eyes:     Extraocular Movements: Extraocular movements intact.     Conjunctiva/sclera: Conjunctivae normal.     Pupils: Pupils are equal, round, and reactive to light.  Cardiovascular:     Rate and Rhythm: Regular rhythm. Tachycardia present.     Heart sounds: Normal heart sounds. No murmur heard. Pulmonary:     Effort: Pulmonary effort is normal. Tachypnea present. No respiratory distress.     Breath sounds: Rhonchi present.  Chest:     Chest wall: No tenderness.  Abdominal:     Palpations: Abdomen is soft.     Tenderness: There is no abdominal tenderness.  Musculoskeletal:        General: No swelling.     Cervical back: Neck supple.     Right lower leg: No tenderness. No edema.     Left lower leg: No tenderness. No edema.  Skin:    General: Skin is warm and dry.     Capillary Refill: Capillary refill takes less than 2 seconds.     Findings: No erythema.  Neurological:     General: No focal deficit present.     Mental Status: He is alert.  Psychiatric:        Mood and Affect: Mood normal.     ED Results / Procedures / Treatments   Labs (all labs ordered are listed, but only abnormal results are displayed) Labs Reviewed  BASIC METABOLIC PANEL - Abnormal; Notable for the following components:      Result Value   Sodium 134 (*)    Glucose, Bld 106 (*)    Creatinine, Ser 1.26 (*)    GFR, Estimated 59 (*)    All other components within normal limits  CBC - Abnormal; Notable for the following components:   RBC 4.03 (*)    RDW 11.4 (*)    Platelets 144 (*)    All other components within normal limits  HEPATIC FUNCTION PANEL - Abnormal; Notable for the following components:   Albumin 3.4 (*)    Total Bilirubin 1.5 (*)    Bilirubin, Direct 0.3 (*)    Indirect Bilirubin 1.2 (*)    All other components within normal limits  BRAIN NATRIURETIC PEPTIDE -  Abnormal; Notable for the following components:   B Natriuretic Peptide 153.2 (*)    All other components within normal limits  RESP PANEL BY RT-PCR (RSV, FLU A&B, COVID)  RVPGX2  LIPASE, BLOOD  LACTIC ACID, PLASMA  TSH  HEPARIN LEVEL (UNFRACTIONATED)  TROPONIN I (HIGH SENSITIVITY)  TROPONIN I (HIGH SENSITIVITY)    EKG EKG Interpretation Date/Time:  Sunday April 04 2023 07:11:45 EDT Ventricular Rate:  90 PR Interval:  155 QRS Duration:  110 QT Interval:  346 QTC Calculation: 424 R Axis:   71  Text Interpretation:  Sinus rhythm Ventricular premature complex Minimal ST depression, inferior leads when compared to prior, new PVC sompared to prior, previous ST depression on prior appears imprvoved. No STEMI Confirmed by Theda Belfast ([id number]) on 04/04/2023 7:16:06 AM  Radiology CT Angio Chest/Abd/Pel for Dissection W and/or Wo Contrast  Result Date: 04/04/2023 CLINICAL DATA:  Abdominal aortic aneurysm, pleuritic chest pain EXAM: CT ANGIOGRAPHY CHEST, ABDOMEN AND PELVIS TECHNIQUE: Non-contrast CT of the chest was initially obtained. Multidetector CT imaging through the chest, abdomen and pelvis was performed using the standard protocol during bolus administration of intravenous contrast. Multiplanar reconstructed images and MIPs were obtained and reviewed to evaluate the vascular anatomy. RADIATION DOSE REDUCTION: This exam was performed according to the departmental dose-optimization program which includes automated exposure control, adjustment of the mA and/or kV according to patient size and/or use of iterative reconstruction technique. CONTRAST:  OMNIPAQUE IOHEXOL 350 MG/ML SOLN COMPARISON:  Prior CTA abdomen/pelvis 04/02/2012; prior CTA of the chest, abdomen and pelvis 01/29/2021 FINDINGS: CTA CHEST FINDINGS Cardiovascular: No evidence of acute intramural hematoma. 2 vessel aortic arch, the right brachiocephalic and left common carotid artery share a common origin. The aortic root is  normal in caliber at 3.8 cm. There is mild aneurysmal dilation of the ascending thoracic aorta with a maximal diameter of 4 cm and mild aneurysmal dilation of the proximal transverse aorta also at 4 cm. The aorta then tapers to normal caliber and is normal throughout the descending thoracic aorta. Scattered atherosclerotic plaque. Calcifications also present throughout the coronary arteries. The heart is normal in size. There is a focal filling defect within a segmental branch of the right middle lobe pulmonary artery leading to a region of focal wedge-shaped patchy airspace opacity in the right middle lobe. Findings are consistent with acute segmental PE with associated pulmonary infarct/perfusion injury. Mediastinum/Nodes: Unremarkable CT appearance of the thyroid gland. No suspicious mediastinal or hilar adenopathy. No soft tissue mediastinal mass. The thoracic esophagus is unremarkable. Lungs/Pleura: Moderately severe combined paraseptal and centrilobular pulmonary emphysema. Which shaped patchy airspace opacity in the periphery of the right middle lobe extending to the pleural surface in the portion of the lung supplied by the thrombosed segmental branch of the right middle lobe pulmonary artery. Findings are consistent with pulmonary infarct versus reperfusion injury. Mild lower lobe bronchial wall thickening. No suspicious pulmonary mass or nodule. Musculoskeletal: No acute fracture or aggressive appearing lytic or blastic osseous lesion. Review of the MIP images confirms the above findings. CTA ABDOMEN AND PELVIS FINDINGS VASCULAR Aorta: Fusiform aneurysmal dilation of the infrarenal abdominal aorta with maximal diameter of 5.8 x 5.7 cm. This is significantly enlarged compared to 5.2 x 5.1 cm 1 year previously. Wall adherent mural thrombus is present. No evidence of dissection. Celiac: Patent without evidence of aneurysm, dissection, vasculitis or significant stenosis. SMA: Patent without evidence of  aneurysm, dissection, vasculitis or significant stenosis. Renals: Solitary renal arteries bilaterally. Mild plaque in the proximal right renal artery resulting in mild stenosis. Focal calcified plaque at the origin of the left renal artery resulting in mild to moderate stenosis. No changes of fibromuscular dysplasia. IMA: Occluded at the origin but reconstitutes via collateral flow. 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 No evidence of internal iliac artery aneurysm. The external iliac arteries are mildly tortuous but otherwise spared from disease. Veins: No focal venous abnormality. Review of the MIP images confirms the above findings. NON-VASCULAR Hepatobiliary: Normal hepatic contour and morphology. No discrete solid hepatic lesion. Multiple  circumscribed low-attenuation cystic lesions again noted. Gallbladder is unremarkable. No intra or extrahepatic biliary ductal dilatation. Pancreas: Unremarkable. No pancreatic ductal dilatation or surrounding inflammatory changes. Spleen: No splenic injury or perisplenic hematoma. Adrenals/Urinary Tract: Normal adrenal glands. No hydronephrosis, nephrolithiasis or enhancing renal mass. Small benign cyst exophytic from the lower pole of the right kidney, unchanged. No imaging follow-up is recommended. The ureters and bladder are unremarkable. Stomach/Bowel: No evidence of obstruction or focal bowel wall thickening. No appendix visualized. It may be surgically absent. The terminal ileum is unremarkable. Lymphatic: No suspicious lymphadenopathy. Reproductive: Prostate is unremarkable. Other: No abdominal wall hernia or abnormality. No abdominopelvic ascites. Musculoskeletal: No acute fracture or aggressive appearing lytic or blastic osseous lesion. Review of the MIP images confirms the above findings. IMPRESSION: 1. Positive for isolated occlusive pulmonary embolus within a segmental branch of the right middle lobe pulmonary artery. There is  associated wedge-shaped pulmonary airspace opacity extending to the pleural surface consistent with a focus of infarct versus reperfusion injury. This likely only the patient's clinical symptoms of pleuritic chest pain. 2. No evidence of acute aortic syndrome in the chest. 3. Enlarging infrarenal abdominal aortic aneurysm now measuring up to 5.8 cm compared to a maximum of 5.2 cm 1 year previously. Recommend referral to a vascular specialist. This recommendation follows ACR consensus guidelines: White Paper of the ACR Incidental Findings Committee II on Vascular Findings. J Am Coll Radiol 2013; 10:789-794. 4. Stable mild fusiform aneurysmal dilation of the ascending and proximal transverse thoracic aorta with a maximal diameter of 4 cm. Recommend annual imaging followup by CTA or MRA. This recommendation follows 2010 ACCF/AHA/AATS/ACR/ASA/SCA/SCAI/SIR/STS/SVM Guidelines for the Diagnosis and Management of Patients with Thoracic Aortic Disease. Circulation. 2010; 121: Z610-R604. 5. Moderately severe combined paraseptal and centrilobular pulmonary emphysema. 6. Aortic and coronary artery atherosclerotic vascular calcifications. 7. 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 8. Mild to moderate stenosis of the proximal left renal artery. 9. Additional ancillary findings as above. Aortic aneurysm NOS (ICD10-I71.9); Aortic Atherosclerosis (ICD10-I70.0) and Emphysema (ICD10-J43.9). Electronically Signed   By: Malachy Moan M.D.   On: 04/04/2023 10:23   DG Chest 2 View  Result Date: 04/04/2023 CLINICAL DATA:  Chest pain EXAM: CHEST - 2 VIEW COMPARISON:  11/09/2022 FINDINGS: Patchy airspace opacities in the lung bases right greater than left new since previous. No pneumothorax. Heart size and mediastinal contours are within normal limits. No effusion. Visualized bones unremarkable. IMPRESSION: Patchy bibasilar airspace disease. Electronically Signed   By: Corlis Leak M.D.   On: 04/04/2023 08:36     Procedures Procedures    CRITICAL CARE Performed by: Canary Brim Arryanna Holquin Total critical care time: 35 minutes Critical care time was exclusive of separately billable procedures and treating other patients. Critical care was necessary to treat or prevent imminent or life-threatening deterioration. Critical care was time spent personally by me on the following activities: development of treatment plan with patient and/or surrogate as well as nursing, discussions with consultants, evaluation of patient's response to treatment, examination of patient, obtaining history from patient or surrogate, ordering and performing treatments and interventions, ordering and review of laboratory studies, ordering and review of radiographic studies, pulse oximetry and re-evaluation of patient's condition.  Medications Ordered in ED Medications  sodium chloride flush (NS) 0.9 % injection 3 mL (3 mLs Intravenous Given 04/04/23 1420)  acetaminophen (TYLENOL) tablet 650 mg (has no administration in time range)    Or  acetaminophen (TYLENOL) suppository 650 mg (has no administration in  time range)  albuterol (PROVENTIL) (2.5 MG/3ML) 0.083% nebulizer solution 2.5 mg (has no administration in time range)  HYDROcodone-acetaminophen (NORCO/VICODIN) 5-325 MG per tablet 1 tablet (has no administration in time range)  heparin ADULT infusion 100 units/mL (25000 units/228mL) (0 Units/hr Intravenous Duplicate 04/04/23 1417)  iohexol (OMNIPAQUE) 350 MG/ML injection 100 mL (100 mLs Intravenous Contrast Given 04/04/23 0952)  heparin bolus via infusion 5,500 Units (5,500 Units Intravenous Bolus from Bag 04/04/23 1419)    ED Course/ Medical Decision Making/ A&P                                 Medical Decision Making Amount and/or Complexity of Data Reviewed Labs: ordered. Radiology: ordered.  Risk Prescription drug management. Decision regarding hospitalization.    Kyle [name] is a 77 y.o. male with a past medical  history significant for CAD status post PCI for previous MI, hypertension, hyperlipidemia, obesity, sleep apnea, COPD, hypothyroidism, and AAA who presents with right-sided chest pain.  According to patient, his symptoms began yesterday and presents as a right-sided chest discomfort and pressure that is worse with exertion and deep breathing.  He reports he is short of breath and cannot take a deep breath.  He does not report significant cough but his wife said he has been coughing.  He denies fevers, chills, congestion, nausea, vomiting, constipation, diarrhea, or urinary changes.  Denies any leg pain or leg swelling.  He is unsure if he has a previous AAA however chart review shows it was about 5.2 cm last year.  Patient said that the right-sided chest discomfort is in the same location he had his pain with his previous MI needing stenting.  He reports the pain is currently 4 out of 10 but at that his worst was an 8 out of 10 earlier.  He has not taken his baby aspirin yet this morning.  He said that he had a normal day on Friday going to the gym but did not injure his right chest during weight lifting or exercise.  He also received a COVID shot on Friday but does not think that is related.  Otherwise he denies complaints.  On exam, lungs were slightly coarse but no wheezing.  Chest was nontender and I cannot reduce discomfort.  There was no murmur.  There was no rash to suggest shingles.  Back nontender.  Abdomen nontender normal bowel sounds present.  Palpable pulses are present in his feet bilaterally.  Good pulses in upper extremities.  Patient resting but clearly uncomfortable.  He is tachycardic and tachypneic.  EKG shows new PVCs but otherwise does not show STEMI.  Clinically I am concerned about his chest discomfort given the location being similar to when he had his previous MI.  Will get troponin and keep him on cardiac monitoring.  Will get chest x-ray and labs.  I did call radiology to discuss  best imaging to rule out pulmonary embolism but also in the context of his known aneurysm.  They felt that a dissection study would be beneficial given the size of his previous aneurysm and although he is not having abdominal discomfort, they feel would be beneficial to image the torso to rule out an aneurysm or dissection contributing.  He did feel that large pulm embolism would likely also be seen.  Will get the dissection study and after workup is completed, if there is no other concerning findings, dissipate discussion with cardiology  to discuss disposition.   11:33 AM CT scan returned with evidence of pulmonary embolism and pulmonary infarct.  Patient still tachypneic and still short of breath.  He is not hypoxic at this time.  Do not feel he is safe for discharge home.  His BNP is slightly elevated and his aorta has grown although it does not appear an acute aortic problem today.  Will call for admission for further management of shortness of breath and chest discomfort in the setting of new pulmonary embolism.        Final Clinical Impression(s) / ED Diagnoses Final diagnoses:  Acute pulmonary embolism, unspecified pulmonary embolism type, unspecified whether acute cor pulmonale present (HCC)  Pulmonary infarct (HCC)    Clinical Impression: 1. Acute pulmonary embolism, unspecified pulmonary embolism type, unspecified whether acute cor pulmonale present (HCC)   2. Pulmonary infarct [facility])     Disposition: Admit  This note was prepared with assistance of Dragon voice recognition software. Occasional wrong-word or sound-a-like substitutions may have occurred due to the inherent limitations of voice recognition software.      Jakwon Gayton, Canary Brim, MD 04/04/23 [phone]

## 2023-04-04 NOTE — Progress Notes (Signed)
ANTICOAGULATION CONSULT NOTE - Follow Up  Pharmacy Consult for heparin Indication: pulmonary embolus  No Known Allergies  Patient Measurements: Height: 5\' 11"  (180.3 cm) Weight: 95.3 kg (210 lb) IBW/kg (Calculated) : 75.3 Heparin Dosing Weight: 94.5 kg   Vital Signs: Temp: 101.3 F (38.5 C) (09/01 2040) Temp Source: Oral (09/01 2040) BP: 155/63 (09/01 2040) Pulse Rate: 90 (09/01 1752)  Labs: Recent Labs    04/04/23 0713 04/04/23 1016 04/04/23 2238  HGB 13.3  --   --   HCT 39.8  --   --   PLT 144*  --   --   HEPARINUNFRC  --   --  0.30  CREATININE 1.26*  --   --   TROPONINIHS 13 13  --     Estimated Creatinine Clearance: 58.8 mL/min (A) (by C-G formula based on SCr of 1.26 mg/dL (H)).   Medical History: Past Medical History:  Diagnosis Date   Anemia    CAD (coronary artery disease) 08/03/2009   Cardiac arrhythmia    COPD (chronic obstructive pulmonary disease) (HCC)    GERD (gastroesophageal reflux disease)    Hyperlipidemia    Hypertension    Hypothyroidism    Myocardial infarction (HCC) 08/03/2009   Sleep apnea treated with nocturnal BiPAP     Medications:  Scheduled:   atorvastatin  40 mg Oral QPM   [START ON 04/05/2023] ferrous sulfate  325 mg Oral Q breakfast   fluticasone furoate-vilanterol  1 puff Inhalation Daily   And   umeclidinium bromide  1 puff Inhalation Daily   [START ON 04/05/2023] levothyroxine  75 mcg Oral Q0600   loratadine  10 mg Oral Daily   sodium chloride flush  3 mL Intravenous Q12H   Infusions:   heparin 1,500 Units/hr (04/04/23 1417)    Assessment: [name] presented with right-sided chest pain that started yesterday. He does have history of prior MI, however work up was negative for repeat infarction. CTA was positive for isolated occlusive pulmonary embolus within a segmental branch of the right middle lobe pulmonary artery with an associated wedge-shaped pulmonary airspace opacity extending to the pleural surface consistent with  a focus of infarct versus reperfusion injury. Additionally, he does have a AAA that has increased from 5.2 cm to 5.8 cm.   He was not on [facility] PTA. Pharmacy was consulted for heparin dosing.   9/1 PM: heparin level returned at 0.3 on 1500 units/hr (therapeutic). Per RN no signs/symptoms of bleeding or issues with the infusion. Last CBC showed Hgb within goal and plts on lower side.  Goal of Therapy:  Heparin level 0.3-0.7 units/ml Monitor platelets by anticoagulation protocol: Yes   Plan:  Increase heparin infusion to 1600 units/hr to keep within therapeutic range Check confirmatory anti-Xa level in 8 hours and daily while on heparin Continue to monitor H&H and platelets  Arabella Merles, PharmD. Clinical Pharmacist 04/04/2023 11:46 PM

## 2023-04-04 NOTE — Progress Notes (Signed)
ANTICOAGULATION CONSULT NOTE - Initial Consult  Pharmacy Consult for heparin Indication: pulmonary embolus  No Known Allergies  Patient Measurements: Height: 5\' 11"  (180.3 cm) Weight: 95.3 kg (210 lb) IBW/kg (Calculated) : 75.3 Heparin Dosing Weight: 94.5 kg   Vital Signs: Temp: 98.7 F (37.1 C) (09/01 1130) Temp Source: Oral (09/01 1130) BP: 134/63 (09/01 1245) Pulse Rate: 89 (09/01 1245)  Labs: Recent Labs    04/04/23 0713 04/04/23 1016  HGB 13.3  --   HCT 39.8  --   PLT 144*  --   CREATININE 1.26*  --   TROPONINIHS 13 13    Estimated Creatinine Clearance: 58.8 mL/min (A) (by C-G formula based on SCr of 1.26 mg/dL (H)).   Medical History: Past Medical History:  Diagnosis Date   Anemia    CAD (coronary artery disease) 08/03/2009   Cardiac arrhythmia    COPD (chronic obstructive pulmonary disease) (HCC)    GERD (gastroesophageal reflux disease)    Hyperlipidemia    Hypertension    Hypothyroidism    Myocardial infarction (HCC) 08/03/2009   Sleep apnea treated with nocturnal BiPAP     Medications:  Scheduled:   sodium chloride flush  3 mL Intravenous Q12H   Infusions:   Assessment: [name] presented with right-sided chest pain that started yesterday. He does have history of prior MI, however work up was negative for repeat infarction. CTA was positive for isolated occlusive pulmonary embolus within a segmental branch of the right middle lobe pulmonary artery with an associated wedge-shaped pulmonary airspace opacity extending to the pleural surface consistent with a focus of infarct versus reperfusion injury. Additionally, he does have a AAA that has increased from 5.2 cm to 5.8 cm.   He was not on [facility] PTA. Pharmacy was consulted for heparin dosing.   Goal of Therapy:  Heparin level 0.3-0.7 units/ml Monitor platelets by anticoagulation protocol: Yes   Plan:  Give 5500 units bolus x 1 Start heparin infusion at 1500 units/hr Check anti-Xa level in 8 hours  and daily while on heparin Continue to monitor H&H and platelets  Lennie Muckle, PharmD PGY1 Pharmacy Resident 04/04/2023 1:26 PM

## 2023-04-04 NOTE — ED Notes (Signed)
ED TO INPATIENT HANDOFF REPORT  ED Nurse Name and Phone #: Tori [phone]  S Name/Age/Gender Kyle [name] 77 y.o. male Room/Bed: 032C/032C  Code Status   Code Status: Prior  Home/SNF/Other Home Patient oriented to: self, place, time, and situation Is this baseline? Yes   Triage Complete: Triage complete  Chief Complaint Pulmonary embolus [facility]) [I26.99]  Triage Note Pt BIB EMS for right sided chest pain that started 36 hours ago. Pt states it hurts to raise right arm up and hurts with movement. Pt worked out yesterday and pain got progressively worse. 130 systolic. Axox4. VSS.   Allergies No Known Allergies  Level of Care/Admitting Diagnosis ED Disposition     ED Disposition  Admit   Condition  --   Comment  Hospital Area: [facility] [[id number]]  Level of Care: Telemetry Medical [[id number]]  [name] place patient in observation at [facility] or [facility] if equivalent level of care is available:: No  Covid Evaluation: Asymptomatic - no recent exposure (last 10 days) testing not required  Diagnosis: Pulmonary embolus [facility]) [[id number]]  Admitting Physician: Clydie Braun [[id number]]  Attending Physician: Clydie Braun [[id number]]          B Medical/Surgery History Past Medical History:  Diagnosis Date   Anemia    CAD (coronary artery disease) 08/03/2009   Cardiac arrhythmia    COPD (chronic obstructive pulmonary disease) (HCC)    GERD (gastroesophageal reflux disease)    Hyperlipidemia    Hypertension    Hypothyroidism    Myocardial infarction (HCC) 08/03/2009   Sleep apnea treated with nocturnal BiPAP    Past Surgical History:  Procedure Laterality Date   APPENDECTOMY     CARDIAC CATHETERIZATION N/A 05/05/2016   Procedure: Left Heart Cath and Coronary Angiography;  Surgeon: Jake Bathe, MD;  Location: [facility] INVASIVE CV LAB;  Service: Cardiovascular;  Laterality: N/A;   CORONARY ANGIOPLASTY WITH STENT PLACEMENT     EYE SURGERY     lasix /BIL    TONSILLECTOMY       A IV Location/Drains/Wounds Patient Lines/Drains/Airways Status     Active Line/Drains/Airways     Name Placement date Placement time Site Days   Peripheral IV 04/04/23 20 G Left Antecubital 04/04/23  0712  Antecubital  less than 1            Intake/Output Last 24 hours No intake or output data in the 24 hours ending 04/04/23 1253  Labs/Imaging Results for orders placed or performed during the hospital encounter of 04/04/23 (from the past 48 hour(s))  Basic metabolic panel     Status: Abnormal   Collection Time: 04/04/23  7:13 AM  Result Value Ref Range   Sodium 134 (L) 135 - 145 mmol/L   Potassium 3.9 3.5 - 5.1 mmol/L   Chloride 99 98 - 111 mmol/L   CO2 24 22 - 32 mmol/L   Glucose, Bld 106 (H) 70 - 99 mg/dL    Comment: Glucose reference range applies only to samples taken after fasting for at least 8 hours.   BUN 16 8 - 23 mg/dL   Creatinine, Ser 4.40 (H) 0.61 - 1.24 mg/dL   Calcium 9.3 8.9 - 34.7 mg/dL   GFR, Estimated 59 (L) >60 mL/min    Comment: (NOTE) Calculated using the CKD-EPI Creatinine Equation (2021)    Anion gap 11 5 - 15    Comment: Performed at [facility], [address], [place], [place] [address]  CBC  Status: Abnormal   Collection Time: 04/04/23  7:13 AM  Result Value Ref Range   WBC 9.7 4.0 - 10.5 K/uL   RBC 4.03 (L) 4.22 - 5.81 MIL/uL   Hemoglobin 13.3 13.0 - 17.0 g/dL   HCT 16.1 09.6 - 04.5 %   MCV 98.8 80.0 - 100.0 fL   MCH 33.0 26.0 - 34.0 pg   MCHC 33.4 30.0 - 36.0 g/dL   RDW 40.9 (L) 81.1 - 91.4 %   Platelets 144 (L) 150 - 400 K/uL   nRBC 0.0 0.0 - 0.2 %    Comment: Performed at [facility], [address] [place], [place] [address]  Troponin I (High Sensitivity)     Status: None   Collection Time: 04/04/23  7:13 AM  Result Value Ref Range   Troponin I (High Sensitivity) 13 <18 ng/L    Comment: (NOTE) Elevated high sensitivity troponin I (hsTnI) values and significant  changes across serial  measurements [name] suggest ACS but many other  chronic and acute conditions are known to elevate hsTnI results.  Refer to the "Links" section for chest pain algorithms and additional  guidance. Performed at [facility], [address], [place], [place] [address]   Hepatic function panel     Status: Abnormal   Collection Time: 04/04/23  7:13 AM  Result Value Ref Range   Total Protein 6.8 6.5 - 8.1 g/dL   Albumin 3.4 (L) 3.5 - 5.0 g/dL   AST 29 15 - 41 U/L   ALT 34 0 - 44 U/L   Alkaline Phosphatase 71 38 - 126 U/L   Total Bilirubin 1.5 (H) 0.3 - 1.2 mg/dL   Bilirubin, Direct 0.3 (H) 0.0 - 0.2 mg/dL   Indirect Bilirubin 1.2 (H) 0.3 - 0.9 mg/dL    Comment: Performed at [facility], [address], [place], [place] [address]  Lipase, blood     Status: None   Collection Time: 04/04/23  7:13 AM  Result Value Ref Range   Lipase 23 11 - 51 U/L    Comment: Performed at [facility], [address] [place], [place] [address]  Brain natriuretic peptide     Status: Abnormal   Collection Time: 04/04/23  7:36 AM  Result Value Ref Range   B Natriuretic Peptide 153.2 (H) 0.0 - 100.0 pg/mL    Comment: Performed at [facility], [address], [place], [place] [address]  Resp panel by RT-PCR (RSV, Flu A&B, Covid) Anterior Nasal Swab     Status: None   Collection Time: 04/04/23  7:36 AM   Specimen: Anterior Nasal Swab  Result Value Ref Range   SARS Coronavirus 2 by RT PCR NEGATIVE NEGATIVE   Influenza A by PCR NEGATIVE NEGATIVE   Influenza B by PCR NEGATIVE NEGATIVE    Comment: (NOTE) The Xpert Xpress SARS-CoV-2/FLU/RSV plus assay is intended as an aid in the diagnosis of influenza from Nasopharyngeal swab specimens and should not be used as a sole basis for treatment. Nasal washings and aspirates are unacceptable for Xpert Xpress SARS-CoV-2/FLU/RSV testing.  Fact Sheet for Patients: BloggerCourse.com  Fact Sheet for Healthcare  Providers: SeriousBroker.it  This test is not yet approved or cleared by the [place] FDA and has been authorized for detection and/or diagnosis of SARS-CoV-2 by FDA under an Emergency Use Authorization (EUA). This EUA will remain in effect (meaning this test can be used) for the duration of the COVID-19 declaration under  Section 564(b)(1) of the Act, 21 U.S.C. section 360bbb-3(b)(1), unless the authorization is terminated or revoked.     Resp Syncytial Virus by PCR NEGATIVE NEGATIVE    Comment: (NOTE) Fact Sheet for Patients: BloggerCourse.com  Fact Sheet for Healthcare Providers: SeriousBroker.it  This test is not yet approved or cleared by the [place] FDA and has been authorized for detection and/or diagnosis of SARS-CoV-2 by FDA under an Emergency Use Authorization (EUA). This EUA will remain in effect (meaning this test can be used) for the duration of the COVID-19 declaration under Section 564(b)(1) of the Act, 21 U.S.C. section 360bbb-3(b)(1), unless the authorization is terminated or revoked.  Performed at [facility], [address], [place], [place] [address]   Lactic acid, plasma     Status: None   Collection Time: 04/04/23  7:36 AM  Result Value Ref Range   Lactic Acid, Venous 1.3 0.5 - 1.9 mmol/L    Comment: Performed at [facility], [address] [place], [place] [address]  Troponin I (High Sensitivity)     Status: None   Collection Time: 04/04/23 10:16 AM  Result Value Ref Range   Troponin I (High Sensitivity) 13 <18 ng/L    Comment: (NOTE) Elevated high sensitivity troponin I (hsTnI) values and significant  changes across serial measurements [name] suggest ACS but many other  chronic and acute conditions are known to elevate hsTnI results.  Refer to the "Links" section for chest pain algorithms and additional  guidance. Performed at [facility],  [address], [place], [place] [address]    CT Angio Chest/Abd/Pel for Dissection W and/or Wo Contrast  Result Date: 04/04/2023 CLINICAL DATA:  Abdominal aortic aneurysm, pleuritic chest pain EXAM: CT ANGIOGRAPHY CHEST, ABDOMEN AND PELVIS TECHNIQUE: Non-contrast CT of the chest was initially obtained. Multidetector CT imaging through the chest, abdomen and pelvis was performed using the standard protocol during bolus administration of intravenous contrast. Multiplanar reconstructed images and MIPs were obtained and reviewed to evaluate the vascular anatomy. RADIATION DOSE REDUCTION: This exam was performed according to the departmental dose-optimization program which includes automated exposure control, adjustment of the mA and/or kV according to patient size and/or use of iterative reconstruction technique. CONTRAST:  OMNIPAQUE IOHEXOL 350 MG/ML SOLN COMPARISON:  Prior CTA abdomen/pelvis 04/02/2012; prior CTA of the chest, abdomen and pelvis 01/29/2021 FINDINGS: CTA CHEST FINDINGS Cardiovascular: No evidence of acute intramural hematoma. 2 vessel aortic arch, the right brachiocephalic and left common carotid artery share a common origin. The aortic root is normal in caliber at 3.8 cm. There is mild aneurysmal dilation of the ascending thoracic aorta with a maximal diameter of 4 cm and mild aneurysmal dilation of the proximal transverse aorta also at 4 cm. The aorta then tapers to normal caliber and is normal throughout the descending thoracic aorta. Scattered atherosclerotic plaque. Calcifications also present throughout the coronary arteries. The heart is normal in size. There is a focal filling defect within a segmental branch of the right middle lobe pulmonary artery leading to a region of focal wedge-shaped patchy airspace opacity in the right middle lobe. Findings are consistent with acute segmental PE with associated pulmonary infarct/perfusion injury. Mediastinum/Nodes: Unremarkable CT appearance of  the thyroid gland. No suspicious mediastinal or hilar adenopathy. No soft tissue mediastinal mass. The thoracic esophagus is unremarkable. Lungs/Pleura: Moderately severe combined paraseptal and centrilobular pulmonary emphysema. Which shaped patchy airspace opacity in the periphery of the right middle lobe extending to the pleural surface  in the portion of the lung supplied by the thrombosed segmental branch of the right middle lobe pulmonary artery. Findings are consistent with pulmonary infarct versus reperfusion injury. Mild lower lobe bronchial wall thickening. No suspicious pulmonary mass or nodule. Musculoskeletal: No acute fracture or aggressive appearing lytic or blastic osseous lesion. Review of the MIP images confirms the above findings. CTA ABDOMEN AND PELVIS FINDINGS VASCULAR Aorta: Fusiform aneurysmal dilation of the infrarenal abdominal aorta with maximal diameter of 5.8 x 5.7 cm. This is significantly enlarged compared to 5.2 x 5.1 cm 1 year previously. Wall adherent mural thrombus is present. No evidence of dissection. Celiac: Patent without evidence of aneurysm, dissection, vasculitis or significant stenosis. SMA: Patent without evidence of aneurysm, dissection, vasculitis or significant stenosis. Renals: Solitary renal arteries bilaterally. Mild plaque in the proximal right renal artery resulting in mild stenosis. Focal calcified plaque at the origin of the left renal artery resulting in mild to moderate stenosis. No changes of fibromuscular dysplasia. IMA: Occluded at the origin but reconstitutes via collateral flow. 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 No evidence of internal iliac artery aneurysm. The external iliac arteries are mildly tortuous but otherwise spared from disease. Veins: No focal venous abnormality. Review of the MIP images confirms the above findings. NON-VASCULAR Hepatobiliary: Normal hepatic contour and morphology. No discrete solid hepatic  lesion. Multiple circumscribed low-attenuation cystic lesions again noted. Gallbladder is unremarkable. No intra or extrahepatic biliary ductal dilatation. Pancreas: Unremarkable. No pancreatic ductal dilatation or surrounding inflammatory changes. Spleen: No splenic injury or perisplenic hematoma. Adrenals/Urinary Tract: Normal adrenal glands. No hydronephrosis, nephrolithiasis or enhancing renal mass. Small benign cyst exophytic from the lower pole of the right kidney, unchanged. No imaging follow-up is recommended. The ureters and bladder are unremarkable. Stomach/Bowel: No evidence of obstruction or focal bowel wall thickening. No appendix visualized. It [name] be surgically absent. The terminal ileum is unremarkable. Lymphatic: No suspicious lymphadenopathy. Reproductive: Prostate is unremarkable. Other: No abdominal wall hernia or abnormality. No abdominopelvic ascites. Musculoskeletal: No acute fracture or aggressive appearing lytic or blastic osseous lesion. Review of the MIP images confirms the above findings. IMPRESSION: 1. Positive for isolated occlusive pulmonary embolus within a segmental branch of the right middle lobe pulmonary artery. There is associated wedge-shaped pulmonary airspace opacity extending to the pleural surface consistent with a focus of infarct versus reperfusion injury. This likely only the patient's clinical symptoms of pleuritic chest pain. 2. No evidence of acute aortic syndrome in the chest. 3. Enlarging infrarenal abdominal aortic aneurysm now measuring up to 5.8 cm compared to a maximum of 5.2 cm 1 year previously. Recommend referral to a vascular specialist. This recommendation follows ACR consensus guidelines: White Paper of the ACR Incidental Findings Committee II on Vascular Findings. J Am Coll Radiol 2013; 10:789-794. 4. Stable mild fusiform aneurysmal dilation of the ascending and proximal transverse thoracic aorta with a maximal diameter of 4 cm. Recommend annual imaging  followup by CTA or MRA. This recommendation follows 2010 ACCF/AHA/AATS/ACR/ASA/SCA/SCAI/SIR/STS/SVM Guidelines for the Diagnosis and Management of Patients with Thoracic Aortic Disease. Circulation. 2010; 121: W098-J191. 5. Moderately severe combined paraseptal and centrilobular pulmonary emphysema. 6. Aortic and coronary artery atherosclerotic vascular calcifications. 7. 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 8. Mild to moderate stenosis of the proximal left renal artery. 9. Additional ancillary findings as above. Aortic aneurysm NOS (ICD10-I71.9); Aortic Atherosclerosis (ICD10-I70.0) and Emphysema (ICD10-J43.9). Electronically Signed   By: Malachy Moan M.D.   On:  04/04/2023 10:23   DG Chest 2 View  Result Date: 04/04/2023 CLINICAL DATA:  Chest pain EXAM: CHEST - 2 VIEW COMPARISON:  11/09/2022 FINDINGS: Patchy airspace opacities in the lung bases right greater than left new since previous. No pneumothorax. Heart size and mediastinal contours are within normal limits. No effusion. Visualized bones unremarkable. IMPRESSION: Patchy bibasilar airspace disease. Electronically Signed   By: Corlis Leak M.D.   On: 04/04/2023 08:36    Pending Labs Unresulted Labs (From admission, onward)    None       Vitals/Pain Today's Vitals   04/04/23 0900 04/04/23 0915 04/04/23 1015 04/04/23 1130  BP: (!) 137/57 126/62 139/72 128/62  Pulse: 89 91 88 90  Resp: (!) 27 (!) 22 (!) 26 (!) 29  Temp:    98.7 F (37.1 C)  TempSrc:    Oral  SpO2: 96% 96% 96% 95%  Weight:      Height:      PainSc:        Isolation Precautions No active isolations  Medications Medications  iohexol (OMNIPAQUE) 350 MG/ML injection 100 mL (100 mLs Intravenous Contrast Given 04/04/23 9604)    Mobility walks     Focused Assessments Cardiac Assessment Handoff:    Lab Results  Component Value Date   CKTOTAL 261 (H) 01/14/2010   CKMB (HH) 01/14/2010    6.5 CRITICAL VALUE NOTED.  VALUE IS  CONSISTENT WITH PREVIOUSLY REPORTED AND CALLED VALUE.   TROPONINI (HH) 01/14/2010    4.14        POSSIBLE MYOCARDIAL ISCHEMIA. SERIAL TESTING RECOMMENDED. CRITICAL VALUE NOTED.  VALUE IS CONSISTENT WITH PREVIOUSLY REPORTED AND CALLED VALUE.   No results found for: "DDIMER" Does the Patient currently have chest pain? No    R Recommendations: See Admitting Provider Note  Report given to:   Additional Notes: axox4, wife at bedside

## 2023-04-05 ENCOUNTER — Observation Stay (HOSPITAL_BASED_OUTPATIENT_CLINIC_OR_DEPARTMENT_OTHER): Payer: Medicare HMO

## 2023-04-05 DIAGNOSIS — I7143 Infrarenal abdominal aortic aneurysm, without rupture: Secondary | ICD-10-CM | POA: Diagnosis not present

## 2023-04-05 DIAGNOSIS — I2699 Other pulmonary embolism without acute cor pulmonale: Secondary | ICD-10-CM

## 2023-04-05 DIAGNOSIS — Z86711 Personal history of pulmonary embolism: Secondary | ICD-10-CM | POA: Diagnosis not present

## 2023-04-05 DIAGNOSIS — I724 Aneurysm of artery of lower extremity: Secondary | ICD-10-CM | POA: Diagnosis not present

## 2023-04-05 DIAGNOSIS — I10 Essential (primary) hypertension: Secondary | ICD-10-CM | POA: Diagnosis not present

## 2023-04-05 DIAGNOSIS — I2510 Atherosclerotic heart disease of native coronary artery without angina pectoris: Secondary | ICD-10-CM

## 2023-04-05 LAB — CBC
HCT: 39.5 % (ref 39.0–52.0)
Hemoglobin: 13 g/dL (ref 13.0–17.0)
MCH: 31.8 pg (ref 26.0–34.0)
MCHC: 32.9 g/dL (ref 30.0–36.0)
MCV: 96.6 fL (ref 80.0–100.0)
Platelets: 148 10*3/uL — ABNORMAL LOW (ref 150–400)
RBC: 4.09 MIL/uL — ABNORMAL LOW (ref 4.22–5.81)
RDW: 11.3 % — ABNORMAL LOW (ref 11.5–15.5)
WBC: 8.3 10*3/uL (ref 4.0–10.5)
nRBC: 0 % (ref 0.0–0.2)

## 2023-04-05 LAB — ECHOCARDIOGRAM COMPLETE
AV Mean grad: 5 mmHg
AV Peak grad: 9.9 mmHg
Ao pk vel: 1.57 m/s
Area-P 1/2: 3.42 cm2
Height: 71 in
S' Lateral: 2.5 cm
Weight: 3360 [oz_av]

## 2023-04-05 LAB — HEPARIN LEVEL (UNFRACTIONATED): Heparin Unfractionated: 0.33 [IU]/mL (ref 0.30–0.70)

## 2023-04-05 LAB — BASIC METABOLIC PANEL
Anion gap: 5 (ref 5–15)
BUN: 18 mg/dL (ref 8–23)
CO2: 27 mmol/L (ref 22–32)
Calcium: 9 mg/dL (ref 8.9–10.3)
Chloride: 102 mmol/L (ref 98–111)
Creatinine, Ser: 1.12 mg/dL (ref 0.61–1.24)
GFR, Estimated: >60 mL/min (ref >60)
Glucose, Bld: 110 mg/dL — ABNORMAL HIGH (ref 70–99)
Potassium: 3.5 mmol/L (ref 3.5–5.1)
Sodium: 134 mmol/L — ABNORMAL LOW (ref 135–145)

## 2023-04-05 MED ORDER — ELIQUIS DVT/PE STARTER PACK 5 MG PO TBPK: ORAL_TABLET | ORAL | 0 refills | Status: DC

## 2023-04-05 MED ORDER — ENSURE ENLIVE PO LIQD
237.0000 mL | Freq: Two times a day (BID) | ORAL | Status: DC
  Administered 2023-04-05: 237 mL via ORAL

## 2023-04-05 MED ORDER — LEVOTHYROXINE SODIUM 25 MCG PO TABS: 25.0000 ug | ORAL_TABLET | Freq: Every day | ORAL | Status: DC

## 2023-04-05 MED ORDER — PERFLUTREN LIPID MICROSPHERE
1.0000 mL | INTRAVENOUS | Status: AC | PRN
  Administered 2023-04-05: 3 mL via INTRAVENOUS

## 2023-04-05 MED ORDER — APIXABAN 5 MG PO TABS: 5.0000 mg | ORAL_TABLET | Freq: Two times a day (BID) | ORAL | Status: DC

## 2023-04-05 MED ORDER — LEVOTHYROXINE SODIUM 25 MCG PO TABS: 25.0000 ug | ORAL_TABLET | Freq: Every day | ORAL | 1 refills | Status: DC

## 2023-04-05 MED ORDER — APIXABAN 5 MG PO TABS
10.0000 mg | ORAL_TABLET | Freq: Two times a day (BID) | ORAL | Status: DC
  Administered 2023-04-05: 10 mg via ORAL
  Filled 2023-04-05 (×2): qty 2

## 2023-04-05 NOTE — Discharge Instructions (Signed)
Take Eliquis for at least 3 months. Check with your PCP regarding extending duration of therapy to 6 months.

## 2023-04-05 NOTE — Discharge Summary (Signed)
Physician Discharge Summary   Patient: Kyle [name] MRN: [id number] DOB: Sep 16, 1945  Admit date:     04/04/2023  Discharge date: 04/05/23  Discharge Physician: Meredeth Ide   PCP: Kristian Covey, MD   Recommendations at discharge:    Follow PCP as outpatient Check TSH in 6 weeks as outpatient Follow vascular surgery as outpatient Take Eliquis for at least 3 months, check with your pcp regarding extending duration of treatment to 6 months.  Discharge Diagnoses: Principal Problem:   Pulmonary embolus (HCC) Active Problems:   Abdominal aortic aneurysm (AAA) (HCC)   Hypertension   CAD (coronary artery disease)   COPD mixed type (HCC)   Heart failure with mildly reduced ejection fraction (HFmrEF) (HCC)   Renal insufficiency   Hyperlipidemia   Hypothyroidism   Obstructive sleep apnea  Resolved Problems:   * No resolved hospital problems. *  Hospital Course: 77 year old male with medical history of hypertension, hyperlipidemia, CAD, COPD, hypothyroidism, AAA presented with complaints of chest pain started yesterday.  Pain was pleuritic and located on right side of the chest.  In the ED CTA chest abdomen pelvis obtained which showed isolated occlusive pulmonary embolism within the segmental branch of right middle lobe pulmonary artery with associated likely infarction as well as enlarging infrarenal abdominal aortic aneurysm now 5.8 cm compared to 5.2 cm 1 year prior. Vascular surgery was consulted  Assessment and Plan:  Acute pulm embolism with infarction -Presented with right-sided chest pain and shortness of breath -CTA chest concerning for pulmonary embolism -Started on IV heparin; venous duplex of lower extremities negative for DVT bilaterally -Echocardiogram showed EF of 60 to 65%, grade 1 diastolic dysfunction, right ventricle size mildly enlarged, right ventricular systolic function is normal -Will switch to Eliquis starter pack   Infrarenal AAA -CT angiogram noted  patient's infrarenal abdominal aortic aneurysm, enlarged from 5.2 cm to 5.8 cm -Vascular surgery following; will need open versus endovascular repair as outpatient -Bilateral arterial duplex ordered today was negative for popliteal aneurysms   Hypertension -HCTZ and benazepril on hold due to mild renal insufficiency -Blood pressure is stable   History of CAD -Patient with history of inferior RCA ST elevation MI back in 2011 requiring stent placement.  Patient followed by Dr. Anne Fu with last cardiac catheterization back in 2017 that noted patent stent of the right RCA.. -Hold asa while on anticoagulation; called and discussed with cardiologist on-call Dr. Antoine Poche, he says to discontinue aspirin and sent home patient only on Eliquis which he will be started for pulm embolism. -Continue statin     Heart failure with mildly reduced EF Chronic.  Patient does not appear grossly fluid overloaded on physical exam.   COPD, without acute exacerbation At baseline patient is not on oxygen. -Continue pharmacy substitution of Trelegy -Albuterol nebs as needed for shortness of breath/wheezing   Renal insufficiency   -Resolved, creatinine 1.12 this morning      Hyperlipidemia -Continue atorvastatin   Hypothyroidism -TSH is less than 0.010 -Patient is on Synthroid 75 mcg daily -We will cut down Synthroid to 25 mcg daily -Recheck TSH in 6 to 8 weeks      Consultants: Vascular surgery Procedures performed:  Disposition: Home Diet recommendation:  Discharge Diet Orders (From admission, onward)     Start     Ordered   04/05/23 0000  Diet - low sodium heart healthy        04/05/23 1745           Regular diet  DISCHARGE MEDICATION: Allergies as of 04/05/2023   No Known Allergies      Medication List     STOP taking these medications    aspirin 81 MG tablet       TAKE these medications    albuterol 108 (90 Base) MCG/ACT inhaler Commonly known as: VENTOLIN HFA Inhale  2 puffs into the lungs every 6 (six) hours as needed for wheezing or shortness of breath.   atorvastatin 40 MG tablet Commonly known as: LIPITOR Take 1 tablet (40 mg total) by mouth every evening at 6 PM.   B-12 PO Take 1 tablet by mouth daily.   benazepril-hydrochlorthiazide 20-25 MG tablet Commonly known as: LOTENSIN HCT Take 1/2 (one half) tablet by mouth daily.   cetirizine 10 MG tablet Commonly known as: ZYRTEC Take 10 mg by mouth daily.   Eliquis DVT/PE Starter Pack Generic drug: Apixaban Starter Pack (10mg  and 5mg ) Take as directed on package: start with two-5mg  tablets twice daily for 7 days. On day 8, switch to one-5mg  tablet twice daily. Start taking on: April 06, 2023   FeroSul 325 (65 Fe) MG tablet Generic drug: ferrous sulfate TAKE ONE TABLET BY MOUTH ONE TIME DAILY WITH BREAKFAST What changed: See the new instructions.   levothyroxine 25 MCG tablet Commonly known as: SYNTHROID Take 1 tablet (25 mcg total) by mouth daily at 6 (six) AM. Start taking on: April 06, 2023 What changed:  medication strength how much to take how to take this when to take this additional instructions   multivitamin tablet Take 1 tablet by mouth daily.   Trelegy Ellipta 100-62.5-25 MCG/ACT Aepb Generic drug: Fluticasone-Umeclidin-Vilant Inhale 1 puff into the lungs daily.   valACYclovir 1000 MG tablet Commonly known as: VALTREX Take 1 g by mouth daily.        Follow-up Information     Maeola Harman, MD. Schedule an appointment as soon as possible for a visit.   Specialties: Vascular Surgery, Cardiology Contact information: [address] [place] [address] [phone]         Kristian Covey, MD Follow up in 1 week(s).   Specialty: Family Medicine Why: Need to check TSH in 6 weeks Contact information: [address] Christena Flake [facility] [place] [address] [phone]                Discharge Exam: Ceasar Mons Weights   04/04/23 0710   Weight: 95.3 kg   Appears in no acute distress S1s2 RRR Lungs are clear to auscultation bilaterally  Condition at discharge: good  The results of significant diagnostics from this hospitalization (including imaging, microbiology, ancillary and laboratory) are listed below for reference.   Imaging Studies: VAS [place] LOWER EXTREMITY ARTERIAL DUPLEX  Result Date: 04/05/2023 LOWER EXTREMITY ARTERIAL DUPLEX STUDY Patient Name:  EASTIN WHITELOW  Date of Exam:   04/05/2023 Medical Rec #: [id number]        Accession #:    [id number] Date of Birth: [birth year]        Patient Gender: M Patient Age:   77 years Exam Location:  [facility] Procedure:      VAS [place] LOWER EXTREMITY ARTERIAL DUPLEX Referring Phys: Lemar Livings --------------------------------------------------------------------------------  Indications: AAA & RLE CIA aneurysm - evaluation of popliteal arteries.  Current ABI: N/A Comparison Study: No previous exams Performing Technologist: Jody Hill RVT, RDMS  Examination Guidelines: A complete evaluation includes B-mode imaging, spectral Doppler, color Doppler, and power Doppler as needed of all accessible portions of each vessel. Bilateral  testing is considered an integral part of a complete examination. Limited examinations for reoccurring indications may be performed as noted.  +----------+--------+-----+--------+---------+--------+ RIGHT     PSV cm/sRatioStenosisWaveform Comments +----------+--------+-----+--------+---------+--------+ POP Prox  72                   triphasic         +----------+--------+-----+--------+---------+--------+ POP Distal78                   triphasic         +----------+--------+-----+--------+---------+--------+ +---------------+-------+-----------+---------+--------+-----+--------+ Right PoplitealAP (cm)Transv (cm)Waveform StenosisShapeComments +---------------+-------+-----------+---------+--------+-----+--------+ Proximal       0.79    0.83       triphasic                      +---------------+-------+-----------+---------+--------+-----+--------+ Mid            0.54   0.68                                      +---------------+-------+-----------+---------+--------+-----+--------+ Distal         0.47   0.43       triphasic                      +---------------+-------+-----------+---------+--------+-----+--------+ +--------------+-------+-----------+---------+--------+-----+--------+ Left PoplitealAP (cm)Transv (cm)Waveform StenosisShapeComments +--------------+-------+-----------+---------+--------+-----+--------+ Proximal      0.70   0.75       triphasic                      +--------------+-------+-----------+---------+--------+-----+--------+ Mid           0.66   0.81                                      +--------------+-------+-----------+---------+--------+-----+--------+ Distal        0.50   0.62       triphasic                      +--------------+-------+-----------+---------+--------+-----+--------+  +----------+--------+-----+--------+---------+--------+ LEFT      PSV cm/sRatioStenosisWaveform Comments +----------+--------+-----+--------+---------+--------+ POP Prox  67                   triphasic         +----------+--------+-----+--------+---------+--------+ POP Distal73                   triphasic         +----------+--------+-----+--------+---------+--------+  Summary: Right: No evidence of popliteal aneurysm Left: No evidence of popliteal aneurysm.  See table(s) above for measurements and observations.    Preliminary    VAS [place] LOWER EXTREMITY VENOUS (DVT)  Result Date: 04/05/2023  Lower Venous DVT Study Patient Name:  EARLIE FREDERICKS  Date of Exam:   04/05/2023 Medical Rec #: [id number]        Accession #:    [id number] Date of Birth: 1946/06/17        Patient Gender: M Patient Age:   56 years Exam Location:  [facility] Procedure:      VAS [place] LOWER  EXTREMITY VENOUS (DVT) Referring Phys: Madelyn Flavors --------------------------------------------------------------------------------  Indications: Pulmonary embolism.  Comparison Study: No previous exams Performing Technologist: Jody Hill RVT, RDMS  Examination Guidelines: A complete evaluation includes B-mode imaging, spectral Doppler, color Doppler, and power  Doppler as needed of all accessible portions of each vessel. Bilateral testing is considered an integral part of a complete examination. Limited examinations for reoccurring indications may be performed as noted. The reflux portion of the exam is performed with the patient in reverse Trendelenburg.  +---------+---------------+---------+-----------+----------+--------------+ RIGHT    CompressibilityPhasicitySpontaneityPropertiesThrombus Aging +---------+---------------+---------+-----------+----------+--------------+ CFV      Full           Yes      Yes                                 +---------+---------------+---------+-----------+----------+--------------+ SFJ      Full                                                        +---------+---------------+---------+-----------+----------+--------------+ FV Prox  Full           Yes      Yes                                 +---------+---------------+---------+-----------+----------+--------------+ FV Mid   Full           Yes      Yes                                 +---------+---------------+---------+-----------+----------+--------------+ FV DistalFull           Yes      Yes                                 +---------+---------------+---------+-----------+----------+--------------+ PFV      Full                                                        +---------+---------------+---------+-----------+----------+--------------+ POP      Full           Yes      Yes                                  +---------+---------------+---------+-----------+----------+--------------+ PTV      Full                                                        +---------+---------------+---------+-----------+----------+--------------+ PERO     Full                                                        +---------+---------------+---------+-----------+----------+--------------+   +---------+---------------+---------+-----------+----------+--------------+ LEFT     CompressibilityPhasicitySpontaneityPropertiesThrombus Aging +---------+---------------+---------+-----------+----------+--------------+ CFV      Full  Yes      Yes                                 +---------+---------------+---------+-----------+----------+--------------+ SFJ      Full                                                        +---------+---------------+---------+-----------+----------+--------------+ FV Prox  Full           Yes      Yes                                 +---------+---------------+---------+-----------+----------+--------------+ FV Mid   Full           Yes      Yes                                 +---------+---------------+---------+-----------+----------+--------------+ FV DistalFull           Yes      Yes                                 +---------+---------------+---------+-----------+----------+--------------+ PFV      Full                                                        +---------+---------------+---------+-----------+----------+--------------+ POP      Full           Yes      Yes                                 +---------+---------------+---------+-----------+----------+--------------+ PTV      Full                                                        +---------+---------------+---------+-----------+----------+--------------+ PERO     Full                                                         +---------+---------------+---------+-----------+----------+--------------+     Summary: BILATERAL: - No evidence of deep vein thrombosis seen in the lower extremities, bilaterally. -No evidence of popliteal cyst, bilaterally.   *See table(s) above for measurements and observations.    Preliminary    ECHOCARDIOGRAM COMPLETE  Result Date: 04/05/2023    ECHOCARDIOGRAM REPORT   Patient Name:   MOTAZ ALBERS Date of Exam: 04/05/2023 Medical Rec #:  [id number]       Height:       71.0 in Accession #:  [id number]      Weight:       210.0 lb Date of Birth:  01/20/1946       BSA:          2.153 m Patient Age:    77 years        BP:           146/58 mmHg Patient Gender: M               HR:           91 bpm. Exam Location:  Inpatient Procedure: 2D Echo, Color Doppler, Cardiac Doppler and Intracardiac            Opacification Agent Indications:    Pulmonary Embolus  History:        Patient has prior history of Echocardiogram examinations, most                 recent 06/18/2022. CHF, CAD, COPD and Pulmonary Embolus; Risk                 Factors:Dyslipidemia, Sleep Apnea and Hypertension.  Sonographer:    Milbert Coulter Referring Phys: [id number] RONDELL A SMITH  Sonographer Comments: Image acquisition challenging due to COPD. IMPRESSIONS  1. Left ventricular ejection fraction, by estimation, is 60 to 65%. The left ventricle has normal function. The left ventricle has no regional wall motion abnormalities. There is moderate concentric left ventricular hypertrophy. Left ventricular diastolic parameters are consistent with Grade I diastolic dysfunction (impaired relaxation).  2. Right ventricular systolic function is normal. The right ventricular size is mildly enlarged.  3. The mitral valve is grossly normal. Trivial mitral valve regurgitation.  4. The aortic valve was not well visualized. Aortic valve regurgitation is not visualized. Aortic valve mean gradient measures 5.0 mmHg.  5. The inferior vena cava is normal in size with  greater than 50% respiratory variability, suggesting right atrial pressure of 3 mmHg. Comparison(s): Prior images reviewed side by side. LVEF remains normal in 60-65% range. Mild RV enlargement with normal contraction. FINDINGS  Left Ventricle: Left ventricular ejection fraction, by estimation, is 60 to 65%. The left ventricle has normal function. The left ventricle has no regional wall motion abnormalities. Definity contrast agent was given IV to delineate the left ventricular  endocardial borders. The left ventricular internal cavity size was normal in size. There is moderate concentric left ventricular hypertrophy. Left ventricular diastolic parameters are consistent with Grade I diastolic dysfunction (impaired relaxation). Right Ventricle: The right ventricular size is mildly enlarged. No increase in right ventricular wall thickness. Right ventricular systolic function is normal. Left Atrium: Left atrial size was normal in size. Right Atrium: Right atrial size was normal in size. Pericardium: There is no evidence of pericardial effusion. Presence of epicardial fat layer. Mitral Valve: The mitral valve is grossly normal. Trivial mitral valve regurgitation. Tricuspid Valve: The tricuspid valve is grossly normal. Tricuspid valve regurgitation is trivial. Aortic Valve: The aortic valve was not well visualized. Aortic valve regurgitation is not visualized. Aortic valve mean gradient measures 5.0 mmHg. Aortic valve peak gradient measures 9.9 mmHg. Pulmonic Valve: The pulmonic valve was not well visualized. Pulmonic valve regurgitation is not visualized. Aorta: Aortic root could not be assessed. Venous: The inferior vena cava is normal in size with greater than 50% respiratory variability, suggesting right atrial pressure of 3 mmHg. IAS/Shunts: No atrial level shunt detected by color flow Doppler.  LEFT VENTRICLE PLAX 2D LVIDd:  3.90 cm Diastology LVIDs:         2.50 cm LV e' medial:    9.03 cm/s LV PW:          1.40 cm LV E/e' medial:  7.1 LV IVS:        1.50 cm LV e' lateral:   11.00 cm/s                        LV E/e' lateral: 5.8  RIGHT VENTRICLE RV Basal diam:  3.70 cm RV Mid diam:    3.10 cm RV S prime:     18.00 cm/s TAPSE (M-mode): 2.2 cm LEFT ATRIUM             Index        RIGHT ATRIUM           Index LA diam:        3.80 cm 1.77 cm/m   RA Area:     11.50 cm LA Vol (A2C):   34.1 ml 15.84 ml/m  RA Volume:   22.30 ml  10.36 ml/m LA Vol (A4C):   52.1 ml 24.20 ml/m LA Biplane Vol: 44.8 ml 20.81 ml/m  AORTIC VALVE AV Vmax:           157.00 cm/s AV Vmean:          103.000 cm/s AV VTI:            0.240 m AV Peak Grad:      9.9 mmHg AV Mean Grad:      5.0 mmHg LVOT Vmax:         128.00 cm/s LVOT Vmean:        81.400 cm/s LVOT VTI:          0.213 m LVOT/AV VTI ratio: 0.89 MITRAL VALVE MV Area (PHT): 3.42 cm    SHUNTS MV Decel Time: 222 msec    Systemic VTI: 0.21 m MV E velocity: 63.90 cm/s MV A velocity: 86.50 cm/s MV E/A ratio:  0.74 Nona Dell MD Electronically signed by Nona Dell MD Signature Date/Time: 04/05/2023/10:21:06 AM    Final    CT Angio Chest/Abd/Pel for Dissection W and/or Wo Contrast  Result Date: 04/04/2023 CLINICAL DATA:  Abdominal aortic aneurysm, pleuritic chest pain EXAM: CT ANGIOGRAPHY CHEST, ABDOMEN AND PELVIS TECHNIQUE: Non-contrast CT of the chest was initially obtained. Multidetector CT imaging through the chest, abdomen and pelvis was performed using the standard protocol during bolus administration of intravenous contrast. Multiplanar reconstructed images and MIPs were obtained and reviewed to evaluate the vascular anatomy. RADIATION DOSE REDUCTION: This exam was performed according to the departmental dose-optimization program which includes automated exposure control, adjustment of the mA and/or kV according to patient size and/or use of iterative reconstruction technique. CONTRAST:  OMNIPAQUE IOHEXOL 350 MG/ML SOLN COMPARISON:  Prior CTA abdomen/pelvis 04/02/2012;  prior CTA of the chest, abdomen and pelvis 01/29/2021 FINDINGS: CTA CHEST FINDINGS Cardiovascular: No evidence of acute intramural hematoma. 2 vessel aortic arch, the right brachiocephalic and left common carotid artery share a common origin. The aortic root is normal in caliber at 3.8 cm. There is mild aneurysmal dilation of the ascending thoracic aorta with a maximal diameter of 4 cm and mild aneurysmal dilation of the proximal transverse aorta also at 4 cm. The aorta then tapers to normal caliber and is normal throughout the descending thoracic aorta. Scattered atherosclerotic plaque. Calcifications also present throughout the coronary arteries. The heart is normal in size.  There is a focal filling defect within a segmental branch of the right middle lobe pulmonary artery leading to a region of focal wedge-shaped patchy airspace opacity in the right middle lobe. Findings are consistent with acute segmental PE with associated pulmonary infarct/perfusion injury. Mediastinum/Nodes: Unremarkable CT appearance of the thyroid gland. No suspicious mediastinal or hilar adenopathy. No soft tissue mediastinal mass. The thoracic esophagus is unremarkable. Lungs/Pleura: Moderately severe combined paraseptal and centrilobular pulmonary emphysema. Which shaped patchy airspace opacity in the periphery of the right middle lobe extending to the pleural surface in the portion of the lung supplied by the thrombosed segmental branch of the right middle lobe pulmonary artery. Findings are consistent with pulmonary infarct versus reperfusion injury. Mild lower lobe bronchial wall thickening. No suspicious pulmonary mass or nodule. Musculoskeletal: No acute fracture or aggressive appearing lytic or blastic osseous lesion. Review of the MIP images confirms the above findings. CTA ABDOMEN AND PELVIS FINDINGS VASCULAR Aorta: Fusiform aneurysmal dilation of the infrarenal abdominal aorta with maximal diameter of 5.8 x 5.7 cm. This is  significantly enlarged compared to 5.2 x 5.1 cm 1 year previously. Wall adherent mural thrombus is present. No evidence of dissection. Celiac: Patent without evidence of aneurysm, dissection, vasculitis or significant stenosis. SMA: Patent without evidence of aneurysm, dissection, vasculitis or significant stenosis. Renals: Solitary renal arteries bilaterally. Mild plaque in the proximal right renal artery resulting in mild stenosis. Focal calcified plaque at the origin of the left renal artery resulting in mild to moderate stenosis. No changes of fibromuscular dysplasia. IMA: Occluded at the origin but reconstitutes via collateral flow. 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 No evidence of internal iliac artery aneurysm. The external iliac arteries are mildly tortuous but otherwise spared from disease. Veins: No focal venous abnormality. Review of the MIP images confirms the above findings. NON-VASCULAR Hepatobiliary: Normal hepatic contour and morphology. No discrete solid hepatic lesion. Multiple circumscribed low-attenuation cystic lesions again noted. Gallbladder is unremarkable. No intra or extrahepatic biliary ductal dilatation. Pancreas: Unremarkable. No pancreatic ductal dilatation or surrounding inflammatory changes. Spleen: No splenic injury or perisplenic hematoma. Adrenals/Urinary Tract: Normal adrenal glands. No hydronephrosis, nephrolithiasis or enhancing renal mass. Small benign cyst exophytic from the lower pole of the right kidney, unchanged. No imaging follow-up is recommended. The ureters and bladder are unremarkable. Stomach/Bowel: No evidence of obstruction or focal bowel wall thickening. No appendix visualized. It may be surgically absent. The terminal ileum is unremarkable. Lymphatic: No suspicious lymphadenopathy. Reproductive: Prostate is unremarkable. Other: No abdominal wall hernia or abnormality. No abdominopelvic ascites. Musculoskeletal: No acute  fracture or aggressive appearing lytic or blastic osseous lesion. Review of the MIP images confirms the above findings. IMPRESSION: 1. Positive for isolated occlusive pulmonary embolus within a segmental branch of the right middle lobe pulmonary artery. There is associated wedge-shaped pulmonary airspace opacity extending to the pleural surface consistent with a focus of infarct versus reperfusion injury. This likely only the patient's clinical symptoms of pleuritic chest pain. 2. No evidence of acute aortic syndrome in the chest. 3. Enlarging infrarenal abdominal aortic aneurysm now measuring up to 5.8 cm compared to a maximum of 5.2 cm 1 year previously. Recommend referral to a vascular specialist. This recommendation follows ACR consensus guidelines: White Paper of the ACR Incidental Findings Committee II on Vascular Findings. J Am Coll Radiol 2013; 10:789-794. 4. Stable mild fusiform aneurysmal dilation of the ascending and proximal transverse thoracic aorta with a maximal diameter of 4 cm. Recommend annual  imaging followup by CTA or MRA. This recommendation follows 2010 ACCF/AHA/AATS/ACR/ASA/SCA/SCAI/SIR/STS/SVM Guidelines for the Diagnosis and Management of Patients with Thoracic Aortic Disease. Circulation. 2010; 121: Z610-R604. 5. Moderately severe combined paraseptal and centrilobular pulmonary emphysema. 6. Aortic and coronary artery atherosclerotic vascular calcifications. 7. 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 8. Mild to moderate stenosis of the proximal left renal artery. 9. Additional ancillary findings as above. Aortic aneurysm NOS (ICD10-I71.9); Aortic Atherosclerosis (ICD10-I70.0) and Emphysema (ICD10-J43.9). Electronically Signed   By: Malachy Moan M.D.   On: 04/04/2023 10:23   DG Chest 2 View  Result Date: 04/04/2023 CLINICAL DATA:  Chest pain EXAM: CHEST - 2 VIEW COMPARISON:  11/09/2022 FINDINGS: Patchy airspace opacities in the lung bases right greater than  left new since previous. No pneumothorax. Heart size and mediastinal contours are within normal limits. No effusion. Visualized bones unremarkable. IMPRESSION: Patchy bibasilar airspace disease. Electronically Signed   By: Corlis Leak M.D.   On: 04/04/2023 08:36    Microbiology: Results for orders placed or performed during the hospital encounter of 04/04/23  Resp panel by RT-PCR (RSV, Flu A&B, Covid) Anterior Nasal Swab     Status: None   Collection Time: 04/04/23  7:36 AM   Specimen: Anterior Nasal Swab  Result Value Ref Range Status   SARS Coronavirus 2 by RT PCR NEGATIVE NEGATIVE Final   Influenza A by PCR NEGATIVE NEGATIVE Final   Influenza B by PCR NEGATIVE NEGATIVE Final    Comment: (NOTE) The Xpert Xpress SARS-CoV-2/FLU/RSV plus assay is intended as an aid in the diagnosis of influenza from Nasopharyngeal swab specimens and should not be used as a sole basis for treatment. Nasal washings and aspirates are unacceptable for Xpert Xpress SARS-CoV-2/FLU/RSV testing.  Fact Sheet for Patients: BloggerCourse.com  Fact Sheet for Healthcare Providers: SeriousBroker.it  This test is not yet approved or cleared by the [place] FDA and has been authorized for detection and/or diagnosis of SARS-CoV-2 by FDA under an Emergency Use Authorization (EUA). This EUA will remain in effect (meaning this test can be used) for the duration of the COVID-19 declaration under Section 564(b)(1) of the Act, 21 U.S.C. section 360bbb-3(b)(1), unless the authorization is terminated or revoked.     Resp Syncytial Virus by PCR NEGATIVE NEGATIVE Final    Comment: (NOTE) Fact Sheet for Patients: BloggerCourse.com  Fact Sheet for Healthcare Providers: SeriousBroker.it  This test is not yet approved or cleared by the [place] FDA and has been authorized for detection and/or diagnosis of SARS-CoV-2  by FDA under an Emergency Use Authorization (EUA). This EUA will remain in effect (meaning this test can be used) for the duration of the COVID-19 declaration under Section 564(b)(1) of the Act, 21 U.S.C. section 360bbb-3(b)(1), unless the authorization is terminated or revoked.  Performed at [facility], [address], [place], [place] [address]     Labs: CBC: Recent Labs  Lab 04/04/23 0713 04/05/23 0237  WBC 9.7 8.3  HGB 13.3 13.0  HCT 39.8 39.5  MCV 98.8 96.6  PLT 144* 148*   Basic Metabolic Panel: Recent Labs  Lab 04/04/23 0713 04/05/23 0237  NA 134* 134*  K 3.9 3.5  CL 99 102  CO2 24 27  GLUCOSE 106* 110*  BUN 16 18  CREATININE 1.26* 1.12  CALCIUM 9.3 9.0   Liver Function Tests: Recent Labs  Lab 04/04/23 0713  AST 29  ALT 34  ALKPHOS 71  BILITOT 1.5*  PROT 6.8  ALBUMIN 3.4*   CBG: No results for input(s): "GLUCAP" in the last 168 hours.  Discharge time spent: greater than 30 minutes.  Signed: Meredeth Ide, MD [facility] 04/05/2023

## 2023-04-05 NOTE — Progress Notes (Signed)
BLE venous duplex and BLE popliteal aneurysm eval duplex has been completed.    Results can be found under chart review under CV PROC. 04/05/2023 11:04 AM Gaylen Venning RVT, RDMS

## 2023-04-05 NOTE — Care Management Obs Status (Signed)
MEDICARE OBSERVATION STATUS NOTIFICATION   Patient Details  Name: ARION BUFKIN MRN: [id number] Date of Birth: [birth year]   Medicare Observation Status Notification Given:  Yes    Epifanio Lesches, RN 04/05/2023, 3:08 PM

## 2023-04-05 NOTE — Plan of Care (Signed)
  Problem: Education: Goal: Knowledge of General Education information will improve Description: Including pain rating scale, medication(s)/side effects and non-pharmacologic comfort measures Outcome: Progressing   Problem: Health Behavior/Discharge Planning: Goal: Ability to manage health-related needs will improve Outcome: Adequate for Discharge   Problem: Clinical Measurements: Goal: Ability to maintain clinical measurements within normal limits will improve Outcome: Adequate for Discharge Goal: Will remain free from infection Outcome: Adequate for Discharge Goal: Diagnostic test results will improve Outcome: Adequate for Discharge Goal: Respiratory complications will improve Outcome: Adequate for Discharge Goal: Cardiovascular complication will be avoided Outcome: Adequate for Discharge   Problem: Activity: Goal: Risk for activity intolerance will decrease Outcome: Adequate for Discharge   Problem: Nutrition: Goal: Adequate nutrition will be maintained Outcome: Adequate for Discharge   Problem: Coping: Goal: Level of anxiety will decrease Outcome: Adequate for Discharge   Problem: Elimination: Goal: Will not experience complications related to bowel motility Outcome: Adequate for Discharge Goal: Will not experience complications related to urinary retention Outcome: Adequate for Discharge   Problem: Pain Managment: Goal: General experience of comfort will improve Outcome: Adequate for Discharge   Problem: Safety: Goal: Ability to remain free from injury will improve Outcome: Adequate for Discharge   Problem: Skin Integrity: Goal: Risk for impaired skin integrity will decrease Outcome: Adequate for Discharge   

## 2023-04-05 NOTE — Progress Notes (Signed)
ANTICOAGULATION CONSULT NOTE - Follow Up  Pharmacy Consult for heparin Indication: pulmonary embolus  No Known Allergies  Patient Measurements: Height: 5\' 11"  (180.3 cm) Weight: 95.3 kg (210 lb) IBW/kg (Calculated) : 75.3 Heparin Dosing Weight: 94.5 kg   Vital Signs: Temp: 98.5 F (36.9 C) (09/02 1032) Temp Source: Oral (09/02 1032) BP: 137/62 (09/02 1032) Pulse Rate: 86 (09/02 1032)  Labs: Recent Labs    04/04/23 0713 04/04/23 1016 04/04/23 2238 04/05/23 0237 04/05/23 0916  HGB 13.3  --   --  13.0  --   HCT 39.8  --   --  39.5  --   PLT 144*  --   --  148*  --   HEPARINUNFRC  --   --  0.30  --  0.33  CREATININE 1.26*  --   --  1.12  --   TROPONINIHS 13 13  --   --   --     Estimated Creatinine Clearance: 66.1 mL/min (by C-G formula based on SCr of 1.12 mg/dL).   Medical History: Past Medical History:  Diagnosis Date   Anemia    CAD (coronary artery disease) 08/03/2009   Cardiac arrhythmia    COPD (chronic obstructive pulmonary disease) (HCC)    GERD (gastroesophageal reflux disease)    Hyperlipidemia    Hypertension    Hypothyroidism    Myocardial infarction (HCC) 08/03/2009   Sleep apnea treated with nocturnal BiPAP     Medications:  Scheduled:   atorvastatin  40 mg Oral QPM   ferrous sulfate  325 mg Oral Q breakfast   fluticasone furoate-vilanterol  1 puff Inhalation Daily   And   umeclidinium bromide  1 puff Inhalation Daily   levothyroxine  75 mcg Oral Q0600   loratadine  10 mg Oral Daily   sodium chloride flush  3 mL Intravenous Q12H   Infusions:   heparin 1,600 Units/hr (04/05/23 0149)    Assessment: Kyle [name] presented with right-sided chest pain that started yesterday. He does have history of prior MI, however work up was negative for repeat infarction. CTA was positive for isolated occlusive pulmonary embolus within a segmental branch of the right middle lobe pulmonary artery with an associated wedge-shaped pulmonary airspace opacity  extending to the pleural surface consistent with a focus of infarct versus reperfusion injury. Additionally, he does have a AAA that has increased from 5.2 cm to 5.8 cm.   He was not on [facility] PTA. Pharmacy was consulted for heparin dosing.   Heparin level returned at 0.33 on 1600 units/hr (therapeutic). Per RN no signs/symptoms of bleeding or issues with the infusion. Last CBC showed Hgb within goal and plts on lower side.  Goal of Therapy:  Heparin level 0.3-0.7 units/ml Monitor platelets by anticoagulation protocol: Yes   Plan:  Continue heparin infusion at 1600 units/hr Check heparin level and CBC daily while on heparin  Follow-up oral anticoagulation plans  Lennie Muckle, PharmD PGY1 Pharmacy Resident 04/05/2023 10:45 AM

## 2023-04-05 NOTE — Progress Notes (Signed)
ANTICOAGULATION CONSULT NOTE - Follow Up  Pharmacy Consult for heparin>>apixaban Indication: pulmonary embolus  No Known Allergies  Patient Measurements: Height: 5\' 11"  (180.3 cm) Weight: 95.3 kg (210 lb) IBW/kg (Calculated) : 75.3 Heparin Dosing Weight: 94.5 kg   Vital Signs: Temp: 99.5 F (37.5 C) (09/02 1535) Temp Source: Oral (09/02 1535) BP: 136/71 (09/02 1535) Pulse Rate: 88 (09/02 1535)  Labs: Recent Labs    04/04/23 0713 04/04/23 1016 04/04/23 2238 04/05/23 0237 04/05/23 0916  HGB 13.3  --   --  13.0  --   HCT 39.8  --   --  39.5  --   PLT 144*  --   --  148*  --   HEPARINUNFRC  --   --  0.30  --  0.33  CREATININE 1.26*  --   --  1.12  --   TROPONINIHS 13 13  --   --   --     Estimated Creatinine Clearance: 66.1 mL/min (by C-G formula based on SCr of 1.12 mg/dL).   Medical History: Past Medical History:  Diagnosis Date   Anemia    CAD (coronary artery disease) 08/03/2009   Cardiac arrhythmia    COPD (chronic obstructive pulmonary disease) (HCC)    GERD (gastroesophageal reflux disease)    Hyperlipidemia    Hypertension    Hypothyroidism    Myocardial infarction (HCC) 08/03/2009   Sleep apnea treated with nocturnal BiPAP     Medications:  Scheduled:   apixaban  10 mg Oral BID   Followed by   Melene Muller ON 04/12/2023] apixaban  5 mg Oral BID   atorvastatin  40 mg Oral QPM   feeding supplement  237 mL Oral BID BM   ferrous sulfate  325 mg Oral Q breakfast   fluticasone furoate-vilanterol  1 puff Inhalation Daily   And   umeclidinium bromide  1 puff Inhalation Daily   [START ON 04/06/2023] levothyroxine  25 mcg Oral Q0600   loratadine  10 mg Oral Daily   sodium chloride flush  3 mL Intravenous Q12H   Infusions:     Assessment: Kyle [name] presented with right-sided chest pain that started yesterday. He does have history of prior MI, however work up was negative for repeat infarction. CTA was positive for isolated occlusive pulmonary embolus within a  segmental branch of the right middle lobe pulmonary artery with an associated wedge-shaped pulmonary airspace opacity extending to the pleural surface consistent with a focus of infarct versus reperfusion injury. Additionally, he does have a AAA that has increased from 5.2 cm to 5.8 cm.   He was not on [facility] PTA. Pharmacy was consulted for heparin dosing.   Consulted to transition to apixaban today.   Goal of Therapy:  Monitor platelets by anticoagulation protocol: Yes   Plan:  Dc heparin Apixaban 10mg  PO BID x7d then 5mg  BID Rx will follow peripherally  Ulyses Southward, PharmD, BCIDP, AAHIVP, CPP Infectious Disease Pharmacist 04/05/2023 4:14 PM

## 2023-04-05 NOTE — Progress Notes (Addendum)
[facility]  PROGRESS NOTE  Kyle [name] UXL:[id number] DOB: [birth year] DOA: 04/04/2023 PCP: Kristian Covey, MD   Brief HPI:   77 year old male with medical history of hypertension, hyperlipidemia, CAD, COPD, hypothyroidism, AAA presented with complaints of chest pain started yesterday.  Pain was pleuritic and located on right side of the chest.  In the ED CTA chest abdomen pelvis obtained which showed isolated occlusive pulmonary embolism within the segmental branch of right middle lobe pulmonary artery with associated likely infarction as well as enlarging infrarenal abdominal aortic aneurysm now 5.8 cm compared to 5.2 cm 1 year prior. Vascular surgery was consulted    Assessment/Plan:   Acute pulm embolism with infarction -Presented with right-sided chest pain and shortness of breath -CTA chest concerning for pulmonary embolism -Started on IV heparin; venous duplex of lower extremities negative for DVT bilaterally -Echocardiogram showed EF of 60 to 65%, grade 1 diastolic dysfunction, right ventricle size mildly enlarged, right ventricular systolic function is normal -Will switch to Eliquis when okay with vascular surgery  Infrarenal AAA -CT angiogram noted patient's infrarenal abdominal aortic aneurysm, enlarged from 5.2 cm to 5.8 cm -Vascular surgery following; will need open versus endovascular repair as outpatient -Bilateral arterial duplex ordered today was negative for popliteal aneurysms  Hypertension -HCTZ and benazepril on hold due to mild renal insufficiency -Blood pressure is stable  History of CAD -Patient with history of inferior RCA ST elevation MI back in 2011 requiring stent placement.  Patient followed by Dr. Anne Fu with last cardiac catheterization back in 2017 that noted patent stent of the right RCA.. -Hold asa while on anticoagulation; called and discussed with cardiologist on-call Dr. Antoine Poche, he says to discontinue aspirin and sent home patient only  on Eliquis which he will be started for pulm embolism. -Continue statin   Heart failure with mildly reduced EF Chronic.  Patient does not appear grossly fluid overloaded on physical exam. -Check BNP.   COPD, without acute exacerbation At baseline patient is not on oxygen. -Continue pharmacy substitution of Trelegy -Albuterol nebs as needed for shortness of breath/wheezing   Renal insufficiency   -Resolved, creatinine 1.12 this morning Creatinine 1.26 which appears slightly higher than previous baseline of around 1. -Avoiding possible nephrotoxic agents due to recent IV contrast given    Hyperlipidemia -Continue atorvastatin   Hypothyroidism -TSH is less than 0.010 -Patient is on Synthroid 75 mcg daily -We will cut down Synthroid to 25 mcg daily -Recheck TSH in 6 to 8 weeks    OSA -Continue CPAP at night   Overweight BMI 29.29 kg/m     Medications     atorvastatin  40 mg Oral QPM   ferrous sulfate  325 mg Oral Q breakfast   fluticasone furoate-vilanterol  1 puff Inhalation Daily   And   umeclidinium bromide  1 puff Inhalation Daily   levothyroxine  75 mcg Oral Q0600   loratadine  10 mg Oral Daily   sodium chloride flush  3 mL Intravenous Q12H     Data Reviewed:   CBG:  No results for input(s): "GLUCAP" in the last 168 hours.  SpO2: 94 % FiO2 (%): 21 %    Vitals:   04/05/23 0100 04/05/23 0416 04/05/23 0807 04/05/23 1032  BP: (!) 143/65 (!) 146/58 137/68 137/62  Pulse: 82  92 86  Resp: 20 20 20 18   Temp: 98.3 F (36.8 C) 98.6 F (37 C) 99.2 F (37.3 C) 98.5 F (36.9 C)  TempSrc: Oral Oral Oral Oral  SpO2:   93% 94%  Weight:      Height:          Data Reviewed:  Basic Metabolic Panel: Recent Labs  Lab 04/04/23 0713 04/05/23 0237  NA 134* 134*  K 3.9 3.5  CL 99 102  CO2 24 27  GLUCOSE 106* 110*  BUN 16 18  CREATININE 1.26* 1.12  CALCIUM 9.3 9.0    CBC: Recent Labs  Lab 04/04/23 0713 04/05/23 0237  WBC 9.7 8.3  HGB 13.3  13.0  HCT 39.8 39.5  MCV 98.8 96.6  PLT 144* 148*    LFT Recent Labs  Lab 04/04/23 0713  AST 29  ALT 34  ALKPHOS 71  BILITOT 1.5*  PROT 6.8  ALBUMIN 3.4*     Antibiotics: Anti-infectives (From admission, onward)    None        DVT prophylaxis: Heparin  Code Status: Full code  Family Communication: Discussed with patient's wife at bedside   CONSULTS vascular surgery   Subjective   Denies pain or shortness of breath this morning.  Not requiring oxygen   Objective    Physical Examination:   General-appears in no acute distress Heart-S1-S2, regular, no murmur auscultated Lungs-clear to auscultation bilaterally, no wheezing or crackles auscultated Abdomen-soft, nontender, no organomegaly Extremities-no edema in the lower extremities   Status is: Inpatient:             Meredeth Ide   [facility] If 7PM-7AM, please contact night-coverage at [email or web address], Office  [phone]   04/05/2023, 12:50 PM  LOS: 0 days

## 2023-04-05 NOTE — Progress Notes (Addendum)
  Progress Note    04/05/2023 7:33 AM Hospital Day 1  Subjective:  denies any abdominal pain.   Tm 101.3 now afebrile  Vitals:   04/05/23 0100 04/05/23 0416  BP: (!) 143/65 (!) 146/58  Pulse: 82   Resp: 20 20  Temp: 98.3 F (36.8 C) 98.6 F (37 C)  SpO2:      Physical Exam: General:  no distress; sitting on side of bed eating breakfast Lungs:  non labored Abdomen:  non tender to palpation   CBC    Component Value Date/Time   WBC 8.3 04/05/2023 0237   RBC 4.09 (L) 04/05/2023 0237   HGB 13.0 04/05/2023 0237   HGB 14.5 09/07/2022 0826   HCT 39.5 04/05/2023 0237   HCT 41.9 09/07/2022 0826   PLT 148 (L) 04/05/2023 0237   PLT 154 09/07/2022 0826   MCV 96.6 04/05/2023 0237   MCV 98 (H) 09/07/2022 0826   MCH 31.8 04/05/2023 0237   MCHC 32.9 04/05/2023 0237   RDW 11.3 (L) 04/05/2023 0237   RDW 12.3 09/07/2022 0826   LYMPHSABS 1.3 09/07/2022 0826   MONOABS 0.6 01/17/2021 1132   EOSABS 0.3 09/07/2022 0826   BASOSABS 0.1 09/07/2022 0826    BMET    Component Value Date/Time   NA 134 (L) 04/05/2023 0237   NA 141 09/07/2022 0826   K 3.5 04/05/2023 0237   CL 102 04/05/2023 0237   CO2 27 04/05/2023 0237   GLUCOSE 110 (H) 04/05/2023 0237   BUN 18 04/05/2023 0237   BUN 16 09/07/2022 0826   CREATININE 1.12 04/05/2023 0237   CREATININE 1.00 04/27/2016 1020   CALCIUM 9.0 04/05/2023 0237   GFRNONAA >60 04/05/2023 0237   GFRAA  01/16/2010 0420    >60        The eGFR has been calculated using the MDRD equation. This calculation has not been validated in all clinical situations. eGFR's persistently <60 mL/min signify possible Chronic Kidney Disease.    INR    Component Value Date/Time   INR 1.1 04/27/2016 1020     Intake/Output Summary (Last 24 hours) at 04/05/2023 0733 Last data filed at 04/05/2023 0420 Gross per 24 hour  Intake 430.87 ml  Output 850 ml  Net -419.13 ml     Assessment/Plan:  77 y.o. male admitted with right sided pleuritic chest pain  found to have PE and findings of 5.8cm AAA  Hospital Day 1  -pt without abdominal pain -BLE duplex has been ordered to evaluate for popliteal aneurysms. -Dr. Randie Heinz discussed open vs endovascular repair but given recent PE, he will need [facility] for some time prior to holding for repair but most likely would not wait 6 months to complete AC.  He will need cardiology and pulmonary clearance prior to surgical intervention.   Doreatha Massed, PA-C [facility] [phone] 04/05/2023 7:33 AM   I agree with the above.  The patient is currently off of the floor.  I will follow-up when he returns  Wells Dura Mccormack

## 2023-04-09 NOTE — Telephone Encounter (Signed)
Noted  

## 2023-04-10 ENCOUNTER — Encounter: Payer: Self-pay | Admitting: Family Medicine

## 2023-04-12 ENCOUNTER — Encounter: Payer: Self-pay | Admitting: Family Medicine

## 2023-04-12 ENCOUNTER — Ambulatory Visit (INDEPENDENT_AMBULATORY_CARE_PROVIDER_SITE_OTHER): Payer: Medicare HMO | Admitting: Family Medicine

## 2023-04-12 VITALS — BP 136/78 | HR 88 | Temp 97.7°F | Ht 70.47 in | Wt 207.1 lb

## 2023-04-12 DIAGNOSIS — Z23 Encounter for immunization: Secondary | ICD-10-CM | POA: Diagnosis not present

## 2023-04-12 DIAGNOSIS — E785 Hyperlipidemia, unspecified: Secondary | ICD-10-CM

## 2023-04-12 DIAGNOSIS — I2699 Other pulmonary embolism without acute cor pulmonale: Secondary | ICD-10-CM

## 2023-04-12 DIAGNOSIS — R490 Dysphonia: Secondary | ICD-10-CM | POA: Diagnosis not present

## 2023-04-12 DIAGNOSIS — E039 Hypothyroidism, unspecified: Secondary | ICD-10-CM

## 2023-04-12 LAB — LIPID PANEL
Cholesterol: 94 mg/dL (ref 0–200)
HDL: 23.9 mg/dL — ABNORMAL LOW (ref >39.00)
LDL Cholesterol: 55 mg/dL (ref 0–99)
NonHDL: 70.46
Total CHOL/HDL Ratio: 4
Triglycerides: 78 mg/dL (ref 0.0–149.0)
VLDL: 15.6 mg/dL (ref 0.0–40.0)

## 2023-04-12 LAB — TSH: TSH: 0 u[IU]/mL — ABNORMAL LOW (ref 0.35–5.50)

## 2023-04-12 MED ORDER — APIXABAN 5 MG PO TABS: 5.0000 mg | ORAL_TABLET | Freq: Two times a day (BID) | ORAL | 3 refills | Status: DC

## 2023-04-12 NOTE — Progress Notes (Signed)
Established Patient Office Visit  Subjective   Patient ID: Kyle [name], male    DOB: 01-01-1946  Age: 77 y.o. MRN: [id number]  Chief Complaint  Patient presents with   Annual Exam   Hospitalization Follow-up    HPI   Kyle [name] has past medical history significant for abdominal aortic aneurysm, CAD, COPD, obstructive sleep apnea, hypertension, hypothyroidism.  Recently admitted for acute pulmonary embolus.  No prior history of PE or DVT.  He was admitted on September 1 with right-sided pleuritic chest pain.  This started the day prior to admission.  He denied any fever.  Went to the ER and CTA chest revealed isolated occlusive pulmonary embolism within the segmental branch of the right middle lobe pulmonary artery.  Also of note was enlarging infrarenal abdominal aortic aneurysm now 5.8 cm compared to 5.2 cm a year ago.  He has pending follow-up with orthopedic surgeon.  Patient was placed on Eliquis starter pack and is transitioning now to 5 mg twice daily.  Will need refills.  No known family history of PE or DVT.  No clear provoking factors.  He is generally fairly active.  Has actually made some major positive dietary changes this year and has lost about 35 pounds.  He denies any major pain at this time.  Still has some chronic dyspnea with exertion.  He does have underlying COPD and takes inhalers for that.  He had a suppressed TSH during hospitalization and dosage of thyroid medication was reduced to 25 mcg.  Will need follow-up TSH in about 2 months.  Patient also relates chronic hoarseness.  He states he feels like he has to clear his throat frequently.  He feels like this has been some progressive.  Long past history of smoking.  No obvious GERD symptoms.  He does take omeprazole regularly.  Past Medical History:  Diagnosis Date   Anemia    CAD (coronary artery disease) 08/03/2009   Cardiac arrhythmia    COPD (chronic obstructive pulmonary disease) (HCC)    GERD (gastroesophageal  reflux disease)    Hyperlipidemia    Hypertension    Hypothyroidism    Myocardial infarction (HCC) 08/03/2009   Sleep apnea treated with nocturnal BiPAP    Past Surgical History:  Procedure Laterality Date   APPENDECTOMY     CARDIAC CATHETERIZATION N/A 05/05/2016   Procedure: Left Heart Cath and Coronary Angiography;  Surgeon: Jake Bathe, MD;  Location: [facility];  Service: Cardiovascular;  Laterality: N/A;   CORONARY ANGIOPLASTY WITH STENT PLACEMENT     EYE SURGERY     lasix /BIL   TONSILLECTOMY      reports that he quit smoking about 29 years ago. His smoking use included cigarettes. He started smoking about 57 years ago. He has a 112 pack-year smoking history. He has never used smokeless tobacco. He reports current alcohol use of about 5.0 standard drinks of alcohol per week. He reports that he does not use drugs. family history includes Hypertension in his mother; Hypotension in his father; Multiple sclerosis in his sister; Stroke in his father and mother; Thyroid disease in his brother. No Known Allergies  Review of Systems  Constitutional:  Negative for chills, fever and malaise/fatigue.  HENT:  Negative for congestion.   Eyes:  Negative for blurred vision.  Respiratory:  Positive for shortness of breath. Negative for cough and hemoptysis.   Cardiovascular:  Negative for chest pain and leg swelling.  Neurological:  Negative for dizziness, weakness and  headaches.      Objective:     BP 136/78 (BP Location: Left Arm, Patient Position: Sitting, Cuff Size: Normal)   Pulse 88   Temp 97.7 F (36.5 C) (Oral)   Ht 5' 10.47" (1.79 m)   Wt 207 lb 1.6 oz (93.9 kg)   SpO2 98%   BMI 29.32 kg/m  BP Readings from Last 3 Encounters:  04/12/23 136/78  04/05/23 136/71  11/09/22 132/70   Wt Readings from Last 3 Encounters:  04/12/23 207 lb 1.6 oz (93.9 kg)  04/04/23 210 lb (95.3 kg)  11/09/22 235 lb (106.6 kg)      Physical Exam Vitals reviewed.   Constitutional:      Appearance: He is well-developed.  HENT:     Right Ear: External ear normal.     Left Ear: External ear normal.  Eyes:     Pupils: Pupils are equal, round, and reactive to light.  Neck:     Thyroid: No thyromegaly.  Cardiovascular:     Rate and Rhythm: Normal rate and regular rhythm.  Pulmonary:     Effort: Pulmonary effort is normal. No respiratory distress.     Breath sounds: Normal breath sounds. No wheezing or rales.  Musculoskeletal:     Cervical back: Neck supple.     Right lower leg: No edema.     Left lower leg: No edema.  Neurological:     Mental Status: He is alert and oriented to person, place, and time.      No results found for any visits on 04/12/23.  Last CBC Lab Results  Component Value Date   WBC 8.3 04/05/2023   HGB 13.0 04/05/2023   HCT 39.5 04/05/2023   MCV 96.6 04/05/2023   MCH 31.8 04/05/2023   RDW 11.3 (L) 04/05/2023   PLT 148 (L) 04/05/2023   Last metabolic panel Lab Results  Component Value Date   GLUCOSE 110 (H) 04/05/2023   NA 134 (L) 04/05/2023   K 3.5 04/05/2023   CL 102 04/05/2023   CO2 27 04/05/2023   BUN 18 04/05/2023   CREATININE 1.12 04/05/2023   GFRNONAA >60 04/05/2023   CALCIUM 9.0 04/05/2023   PROT 6.8 04/04/2023   ALBUMIN 3.4 (L) 04/04/2023   LABGLOB 2.5 09/07/2022   AGRATIO 1.8 09/07/2022   BILITOT 1.5 (H) 04/04/2023   ALKPHOS 71 04/04/2023   AST 29 04/04/2023   ALT 34 04/04/2023   ANIONGAP 5 04/05/2023   Last lipids Lab Results  Component Value Date   CHOL 123 09/07/2022   HDL 32 (L) 09/07/2022   LDLCALC 77 09/07/2022   TRIG 67 09/07/2022   CHOLHDL 3.8 09/07/2022   Last hemoglobin A1c Lab Results  Component Value Date   HGBA1C 7.1 (H) 11/04/2020   Last thyroid functions Lab Results  Component Value Date   TSH <0.010 (L) 04/04/2023      The ASCVD Risk score (Arnett DK, et al., 2019) failed to calculate for the following reasons:   The valid total cholesterol range is 130 to  320 mg/dL    Assessment & Plan:   #1 pulmonary embolus involving right middle lobe.  This was initial pulmonary embolus.  No prior history of DVT.  Venous Dopplers were unremarkable.  Patient currently on Eliquis 5 mg twice daily and refill provided.  He is aware he will need a minimum of 3 months of therapy and possibly 6 months.  Will obtain hematology consult to guide duration of therapy and whether to consider  chronic prophylaxis in the setting of no clear provoking factors.  #2 chronic hoarseness.  Ex-smoker.  Duration of hoarseness months.  He does have history of reflux but controlled with omeprazole.  Set up ENT referral for consideration of naso-laryngoscopy  #3 hypothyroidism.  Recently over replaced.  Thyroid medication adjusted.  Recheck TSH in about 8 weeks.  Future order placed.  #4 history of hyperlipidemia.  On atorvastatin.  Recheck fasting lipids today.  #5 health maintenance.  Consents to flu vaccine.  This was given today.  Other vaccines up-to-date  #6 weight loss.  This is intentional weight loss.  Both he and his wife had made some significant dietary changes over the past several months and he feels much better overall.  He is also been exercising fairly regularly  Over 40 minutes were spent reviewing recent studies, hospitalization events, medications, and discussing multiple issues above  No follow-ups on file.    Evelena Peat, MD

## 2023-04-12 NOTE — Patient Instructions (Signed)
I will set up ENT and Hematology referrals  Remember to get follow up thyroid blood test in about 2 months.

## 2023-04-13 NOTE — Addendum Note (Signed)
Addended by: Christy Sartorius on: 04/13/2023 10:39 AM   Modules accepted: Orders

## 2023-04-19 ENCOUNTER — Encounter: Payer: Self-pay | Admitting: Family Medicine

## 2023-05-10 ENCOUNTER — Inpatient Hospital Stay: Payer: Medicare HMO

## 2023-05-10 ENCOUNTER — Inpatient Hospital Stay: Payer: Medicare HMO | Attending: Hematology and Oncology | Admitting: Hematology and Oncology

## 2023-05-10 VITALS — BP 131/82 | HR 86 | Temp 97.8°F | Resp 16 | Wt 211.3 lb

## 2023-05-10 DIAGNOSIS — Z823 Family history of stroke: Secondary | ICD-10-CM | POA: Insufficient documentation

## 2023-05-10 DIAGNOSIS — I2510 Atherosclerotic heart disease of native coronary artery without angina pectoris: Secondary | ICD-10-CM | POA: Insufficient documentation

## 2023-05-10 DIAGNOSIS — I252 Old myocardial infarction: Secondary | ICD-10-CM | POA: Diagnosis not present

## 2023-05-10 DIAGNOSIS — Z87891 Personal history of nicotine dependence: Secondary | ICD-10-CM | POA: Insufficient documentation

## 2023-05-10 DIAGNOSIS — Z7901 Long term (current) use of anticoagulants: Secondary | ICD-10-CM | POA: Insufficient documentation

## 2023-05-10 DIAGNOSIS — I2699 Other pulmonary embolism without acute cor pulmonale: Secondary | ICD-10-CM | POA: Insufficient documentation

## 2023-05-10 DIAGNOSIS — I719 Aortic aneurysm of unspecified site, without rupture: Secondary | ICD-10-CM | POA: Diagnosis not present

## 2023-05-10 LAB — CMP (CANCER CENTER ONLY)
ALT: 43 U/L (ref 0–44)
AST: 29 U/L (ref 15–41)
Albumin: 4 g/dL (ref 3.5–5.0)
Alkaline Phosphatase: 84 U/L (ref 38–126)
Anion gap: 7 (ref 5–15)
BUN: 14 mg/dL (ref 8–23)
CO2: 29 mmol/L (ref 22–32)
Calcium: 10.3 mg/dL (ref 8.9–10.3)
Chloride: 104 mmol/L (ref 98–111)
Creatinine: 0.89 mg/dL (ref 0.61–1.24)
GFR, Estimated: >60 mL/min (ref >60)
Glucose, Bld: 121 mg/dL — ABNORMAL HIGH (ref 70–99)
Potassium: 3.9 mmol/L (ref 3.5–5.1)
Sodium: 140 mmol/L (ref 135–145)
Total Bilirubin: 0.9 mg/dL (ref 0.3–1.2)
Total Protein: 7.2 g/dL (ref 6.5–8.1)

## 2023-05-10 LAB — CBC WITH DIFFERENTIAL (CANCER CENTER ONLY)
Abs Immature Granulocytes: 0.01 10*3/uL (ref 0.00–0.07)
Basophils Absolute: 0.1 10*3/uL (ref 0.0–0.1)
Basophils Relative: 1 %
Eosinophils Absolute: 0.2 10*3/uL (ref 0.0–0.5)
Eosinophils Relative: 3 %
HCT: 39.2 % (ref 39.0–52.0)
Hemoglobin: 13.3 g/dL (ref 13.0–17.0)
Immature Granulocytes: 0 %
Lymphocytes Relative: 21 %
Lymphs Abs: 1.2 10*3/uL (ref 0.7–4.0)
MCH: 32.4 pg (ref 26.0–34.0)
MCHC: 33.9 g/dL (ref 30.0–36.0)
MCV: 95.6 fL (ref 80.0–100.0)
Monocytes Absolute: 0.6 10*3/uL (ref 0.1–1.0)
Monocytes Relative: 11 %
Neutro Abs: 3.7 10*3/uL (ref 1.7–7.7)
Neutrophils Relative %: 64 %
Platelet Count: 167 10*3/uL (ref 150–400)
RBC: 4.1 MIL/uL — ABNORMAL LOW (ref 4.22–5.81)
RDW: 12.2 % (ref 11.5–15.5)
WBC Count: 5.8 10*3/uL (ref 4.0–10.5)
nRBC: 0 % (ref 0.0–0.2)

## 2023-05-10 NOTE — Progress Notes (Signed)
[facility] [phone]   [phone]  INITIAL CONSULT NOTE  Patient Care Team: Kristian Covey, MD as PCP - General (Family Medicine) Jake Bathe, MD as PCP - Cardiology (Cardiology)  Hematological/Oncological History # Acute Pulmonary Embolism 04/04/2023: CTA showed  isolated occlusive pulmonary embolus within a segmental branch of the right middle lobe pulmonary artery. There is associated wedge-shaped pulmonary airspace opacity extending to the pleural surface consistent with a focus of infarct versus reperfusion injury 05/10/2023: establish care with Dr. Leonides Schanz   CHIEF COMPLAINTS/PURPOSE OF CONSULTATION:  "Pulmonary Embolism "  HISTORY OF PRESENTING ILLNESS:  Kyle [name] 77 y.o. male with medical history significant for coronary artery disease, COPD, GERD, hyperlipidemia, hypertension, and hypothyroidism who presents for evaluation of a pulmonary embolism.  On review of the previous records Kyle [name] presented to the emergency department on 04/04/2023 with chest pain.  A CTA showed isolated occlusive pulmonary embolus within a segmental branch of the right middle lobe pulmonary artery. There is associated wedge-shaped pulmonary airspace opacity extending to the pleural surface consistent with a focus of infarct versus reperfusion injury.  Lower extremity Doppler showed no evidence of DVT and echocardiogram showed no heart strain.  He was started on Eliquis therapy and discharged the next day.  Due to concern for his pulmonary embolism he was referred to hematology for further evaluation and management.  On exam today Kyle [name] is accompanied by his wife.  He reports that he felt an isolated pain under his right ribs.  He felt like he could point to the site of pain.  He notes that as soon as he heparin was started in the hospital the pain resolved.  He is had no pain since.  Has been his best to try to work out and has been doing a lot of  walking and heavy lifting at the gym.  He reports he did have a COVID vaccine the day before he developed a blood clot.  After he developed the pain he went to the emergency department 24 hours later.  He had not had any recent illnesses such as runny nose, sore throat, or cough.  He reports that he has not had any recent surgeries, prolonged car or plane travel.  This is his first blood clot to the best of his knowledge.  On further discussion he reports that his father had a stroke and kidney stones.  He has 4 brothers all of whom are healthy.  He has a mother with a stroke and he has no children.  He had a paternal aunt with leukemia.  He was a former smoker having quit 25 years ago.  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  He notes he is tolerating Eliquis well with no bleeding, bruising, or dark stools.  He otherwise denies any fevers, chills, sweats, nausea, vomiting or diarrhea.  A full 10 point ROS is otherwise negative.  MEDICAL HISTORY:  Past Medical History:  Diagnosis Date   Anemia    CAD (coronary artery disease) 08/03/2009   Cardiac arrhythmia    COPD (chronic obstructive pulmonary disease) (HCC)    GERD (gastroesophageal reflux disease)    Hyperlipidemia    Hypertension    Hypothyroidism    Myocardial infarction (HCC) 08/03/2009   Sleep apnea treated with nocturnal BiPAP     SURGICAL HISTORY: Past Surgical History:  Procedure Laterality Date   APPENDECTOMY     CARDIAC CATHETERIZATION  N/A 05/05/2016   Procedure: Left Heart Cath and Coronary Angiography;  Surgeon: Jake Bathe, MD;  Location: [facility];  Service: Cardiovascular;  Laterality: N/A;   CORONARY ANGIOPLASTY WITH STENT PLACEMENT     EYE SURGERY     lasix /BIL   TONSILLECTOMY      SOCIAL HISTORY: Social History   Socioeconomic History   Marital status: Married    Spouse name: Not on file   Number of children: 0   Years of education: Not on file    Highest education level: 12th grade  Occupational History   Occupation: retired  Tobacco Use   Smoking status: Former    Current packs/day: 0.00    Average packs/day: 4.0 packs/day for 28.0 years (112.0 ttl pk-yrs)    Types: Cigarettes    Start date: 12/10/1965    Quit date: 12/10/1993    Years since quitting: 29.4   Smokeless tobacco: Never  Vaping Use   Vaping status: Never Used  Substance and Sexual Activity   Alcohol use: Yes    Alcohol/week: 5.0 standard drinks of alcohol    Types: 5 Standard drinks or equivalent per week    Comment: once every 2 months   Drug use: No   Sexual activity: Not on file  Other Topics Concern   Not on file  Social History Narrative   Not on file   Social Determinants of Health   Financial Resource Strain: Low Risk  (04/09/2023)   Overall Financial Resource Strain (CARDIA)    Difficulty of Paying Living Expenses: Not hard at all  Food Insecurity: No Food Insecurity (05/10/2023)   Hunger Vital Sign    Worried About Running Out of Food in the Last Year: Never true    Ran Out of Food in the Last Year: Never true  Transportation Needs: No Transportation Needs (05/10/2023)   PRAPARE - Administrator, Civil Service (Medical): No    Lack of Transportation (Non-Medical): No  Physical Activity: Unknown (04/09/2023)   Exercise Vital Sign    Days of Exercise per Week: Patient declined    Minutes of Exercise per Session: Not on file  Stress: No Stress Concern Present (04/09/2023)   [employer] of Occupational Health - Occupational Stress Questionnaire    Feeling of Stress : Not at all  Social Connections: Unknown (04/09/2023)   Social Connection and Isolation Panel [NHANES]    Frequency of Communication with Friends and Family: Three times a week    Frequency of Social Gatherings with Friends and Family: Once a week    Attends Religious Services: Not on Marketing executive or Organizations: Yes    Attends Banker  Meetings: More than 4 times per year    Marital Status: Married  Catering manager Violence: Not At Risk (05/10/2023)   Humiliation, Afraid, Rape, and Kick questionnaire    Fear of Current or Ex-Partner: No    Emotionally Abused: No    Physically Abused: No    Sexually Abused: No    FAMILY HISTORY: Family History  Problem Relation Age of Onset   Stroke Mother    Hypertension Mother    Stroke Father    Hypotension Father    Multiple sclerosis Sister    Thyroid disease Brother     ALLERGIES:  has No Known Allergies.  MEDICATIONS:  Current Outpatient Medications  Medication Sig Dispense Refill   albuterol (VENTOLIN HFA) 108 (90 Base) MCG/ACT inhaler Inhale  2 puffs into the lungs every 6 (six) hours as needed for wheezing or shortness of breath. 6.7 g 11   apixaban (ELIQUIS) 5 MG TABS tablet Take 1 tablet (5 mg total) by mouth 2 (two) times daily. 60 tablet 3   atorvastatin (LIPITOR) 40 MG tablet Take 1 tablet (40 mg total) by mouth every evening at 6 PM. 90 tablet 3   benazepril-hydrochlorthiazide (LOTENSIN HCT) 20-25 MG tablet Take 1/2 (one half) tablet by mouth daily. 45 tablet 3   cetirizine (ZYRTEC) 10 MG tablet Take 10 mg by mouth daily.     Cyanocobalamin (B-12 PO) Take 1 tablet by mouth daily.     FEROSUL 325 (65 Fe) MG tablet TAKE ONE TABLET BY MOUTH ONE TIME DAILY WITH BREAKFAST (Patient taking differently: Take 325 mg by mouth daily with breakfast.) 90 tablet 3   Fluticasone-Umeclidin-Vilant (TRELEGY ELLIPTA) 100-62.5-25 MCG/ACT AEPB Inhale 1 puff into the lungs daily. 60 each 11   levothyroxine (SYNTHROID) 25 MCG tablet Take 1 tablet (25 mcg total) by mouth daily at 6 (six) AM. 30 tablet 1   Multiple Vitamin (MULTIVITAMIN) tablet Take 1 tablet by mouth daily.     valACYclovir (VALTREX) 1000 MG tablet Take 1 g by mouth daily.     No current facility-administered medications for this visit.    REVIEW OF SYSTEMS:   Constitutional: ( - ) fevers, ( - )  chills , ( - ) night  sweats Eyes: ( - ) blurriness of vision, ( - ) double vision, ( - ) watery eyes Ears, nose, mouth, throat, and face: ( - ) mucositis, ( - ) sore throat Respiratory: ( - ) cough, ( - ) dyspnea, ( - ) wheezes Cardiovascular: ( - ) palpitation, ( - ) chest discomfort, ( - ) lower extremity swelling Gastrointestinal:  ( - ) nausea, ( - ) heartburn, ( - ) change in bowel habits Skin: ( - ) abnormal skin rashes Lymphatics: ( - ) new lymphadenopathy, ( - ) easy bruising Neurological: ( - ) numbness, ( - ) tingling, ( - ) new weaknesses Behavioral/Psych: ( - ) mood change, ( - ) new changes  All other systems were reviewed with the patient and are negative.  PHYSICAL EXAMINATION:  Vitals:   05/10/23 0856  BP: 131/82  Pulse: 86  Resp: 16  Temp: 97.8 F (36.6 C)  SpO2: 96%   Filed Weights   05/10/23 0856  Weight: 211 lb 4.8 oz (95.8 kg)    GENERAL: well appearing elderly Caucasian male in NAD  SKIN: skin color, texture, turgor are normal, no rashes or significant lesions EYES: conjunctiva are pink and non-injected, sclera clear LUNGS: clear to auscultation and percussion with normal breathing effort HEART: regular rate & rhythm and no murmurs and no lower extremity edema Musculoskeletal: no cyanosis of digits and no clubbing  PSYCH: alert & oriented x 3, fluent speech NEURO: no focal motor/sensory deficits  LABORATORY DATA:  I have reviewed the data as listed    Latest Ref Rng & Units 05/10/2023    9:50 AM 04/05/2023    2:37 AM 04/04/2023    7:13 AM  CBC  WBC 4.0 - 10.5 K/uL 5.8  8.3  9.7   Hemoglobin 13.0 - 17.0 g/dL 40.9  81.1  91.4   Hematocrit 39.0 - 52.0 % 39.2  39.5  39.8   Platelets 150 - 400 K/uL 167  148  144        Latest Ref Rng & Units  04/05/2023    2:37 AM 04/04/2023    7:13 AM 09/07/2022    8:26 AM  CMP  Glucose 70 - 99 mg/dL 161  096  045   BUN 8 - 23 mg/dL 18  16  16    Creatinine 0.61 - 1.24 mg/dL 4.09  8.11  9.14   Sodium 135 - 145 mmol/L 134  134  141    Potassium 3.5 - 5.1 mmol/L 3.5  3.9  4.5   Chloride 98 - 111 mmol/L 102  99  102   CO2 22 - 32 mmol/L 27  24  26    Calcium 8.9 - 10.3 mg/dL 9.0  9.3  9.5   Total Protein 6.5 - 8.1 g/dL  6.8  6.9   Total Bilirubin 0.3 - 1.2 mg/dL  1.5  0.5   Alkaline Phos 38 - 126 U/L  71  80   AST 15 - 41 U/L  29  41   ALT 0 - 44 U/L  34  47      ASSESSMENT & PLAN Kyle [name] 77 y.o. male with medical history significant for coronary artery disease, COPD, GERD, hyperlipidemia, hypertension, and hypothyroidism who presents for evaluation of a pulmonary embolism.  After review of the labs, review of the records, and discussion with the patient the patients findings are most consistent with an unprovoked pulmonary embolism.    A provoked venous thromboembolism (VTE) is one that has a clear inciting factor or event. Provoking factors include prolonged travel/immobility, surgery (particular abdominal or orthropedic), trauma,  and pregnancy/ estrogen containing birth control. After a detailed history and review of the records there is no clear provoking factor for this patient's VTE.  Patients with unprovoked VTEs have up to 25% recurrence after 5 years and 36% at 10 years, with 4% of these clots being fatal (BMJ?2019;366:l4363). Therefore the formal recommendation for unprovoked VTE's is lifelong anticoagulation, as the cause [name] not be transient or reversible. We recommend 6 months or full strength anticoagulation with a re-evaluation after that time.  The patient's will then have a choice of maintenance dose DOAC (preferred, recommended), 81mg  ASA PO daily (non-preferred), or no further anticoagulation (not recommended).    #Unprovoked Pulmonary Embolism  --findings at this time are consistent with a unprovoked VTE  --will order baseline CMP and CBC to assure labs are adequate for DOAC therapy  --rule out APS with anticardiolipin and anti beta2 glycoprotein antibodies.  Lupus anticoagulant panel would be  altered by presence of blood thinner, will hold on this testing.   --recommend the patient continue eliquis  5mg  BID.  Once he has completed 6 months of full-strength anticoagulation therapy can consider maintenance dosing 2.5 mg twice daily. --patient denies any bleeding, bruising, or dark stools on this medication. It is well tolerated. No difficulties accessing/affording the medication  --RTC in 6 months' time with strict return precautions for overt signs of bleeding.    # Aortic Aneurysm -- Patient has an aortic aneurysm and surgery is currently considering surgical management. -- Would recommend continuation of full dose Eliquis for at least 3 months time before proceeding with surgery.  The earliest safe timeframe for surgery would be 07/04/2023. -- If surgery is not urgent and can wait would recommend holding off for the full 6 months of anticoagulation therapy, with a surgical date of later than 10/02/2023. -- Would recommend holding Eliquis 48 hours prior to procedure and restarting once hemostasis is achieved.  Orders Placed This Encounter  Procedures  CBC with Differential (Cancer Center Only)    Standing Status:   Future    Number of Occurrences:   1    Standing Expiration Date:   05/09/2024   CMP (Cancer Center only)    Standing Status:   Future    Number of Occurrences:   1    Standing Expiration Date:   05/09/2024   Cardiolipin antibodies, IgG, IgM, IgA*    Standing Status:   Future    Number of Occurrences:   1    Standing Expiration Date:   05/09/2024   Beta-2-glycoprotein i abs, IgG/M/A    Standing Status:   Future    Number of Occurrences:   1    Standing Expiration Date:   05/09/2024    All questions were answered. The patient knows to call the clinic with any problems, questions or concerns.  A total of more than 60 minutes were spent on this encounter with face-to-face time and non-face-to-face time, including preparing to see the patient, ordering tests and/or  medications, counseling the patient and coordination of care as outlined above.   Ulysees Barns, MD Department of Hematology/Oncology [facility] at [facility] Phone: [phone] Pager: [phone] Email: [email or web address]  05/10/2023 10:16 AM

## 2023-05-11 ENCOUNTER — Telehealth: Payer: Self-pay | Admitting: Hematology and Oncology

## 2023-05-11 LAB — BETA-2-GLYCOPROTEIN I ABS, IGG/M/A
Beta-2 Glyco I IgG: <9 GPI IgG units (ref 0–20)
Beta-2-Glycoprotein I IgA: <9 GPI IgA units (ref 0–25)
Beta-2-Glycoprotein I IgM: <9 GPI IgM units (ref 0–32)

## 2023-05-11 LAB — CARDIOLIPIN ANTIBODIES, IGG, IGM, IGA
Anticardiolipin IgA: <9 [APL'U]/mL (ref 0–11)
Anticardiolipin IgG: <9 [GPL'U]/mL (ref 0–14)
Anticardiolipin IgM: 10 [MPL'U]/mL (ref 0–12)

## 2023-05-12 ENCOUNTER — Encounter: Payer: Self-pay | Admitting: Vascular Surgery

## 2023-05-12 ENCOUNTER — Ambulatory Visit: Payer: Medicare HMO | Admitting: Vascular Surgery

## 2023-05-12 VITALS — BP 125/74 | HR 83 | Temp 98.1°F | Resp 20 | Ht 70.5 in | Wt 211.2 lb

## 2023-05-12 DIAGNOSIS — I7143 Infrarenal abdominal aortic aneurysm, without rupture: Secondary | ICD-10-CM | POA: Diagnosis not present

## 2023-05-12 NOTE — Progress Notes (Signed)
Patient ID: Kyle [name], male   DOB: [birth year], 77 y.o.   MRN: [id number]  Reason for Consult: Follow-up   Referred by Kristian Covey, MD  Subjective:     HPI:  Kyle [name] is a 77 y.o. male recently admitted with pulmonary embolus found to have enlarging abdominal aortic aneurysm.  Patient is actually done quite well following his anticoagulated.  He has no new back or abdominal pain.  He remains on coagulation and has recently been evaluated by hematology.  He is here today to discuss surgical options.  Past Medical History:  Diagnosis Date   Anemia    CAD (coronary artery disease) 08/03/2009   Cardiac arrhythmia    COPD (chronic obstructive pulmonary disease) (HCC)    GERD (gastroesophageal reflux disease)    Hyperlipidemia    Hypertension    Hypothyroidism    Myocardial infarction (HCC) 08/03/2009   Sleep apnea treated with nocturnal BiPAP    Family History  Problem Relation Age of Onset   Stroke Mother    Hypertension Mother    Stroke Father    Hypotension Father    Multiple sclerosis Sister    Thyroid disease Brother    Past Surgical History:  Procedure Laterality Date   APPENDECTOMY     CARDIAC CATHETERIZATION N/A 05/05/2016   Procedure: Left Heart Cath and Coronary Angiography;  Surgeon: Jake Bathe, MD;  Location: [facility];  Service: Cardiovascular;  Laterality: N/A;   CORONARY ANGIOPLASTY WITH STENT PLACEMENT     EYE SURGERY     lasix /BIL   TONSILLECTOMY      Short Social History:  Social History   Tobacco Use   Smoking status: Former    Current packs/day: 0.00    Average packs/day: 4.0 packs/day for 28.0 years (112.0 ttl pk-yrs)    Types: Cigarettes    Start date: 12/10/1965    Quit date: 12/10/1993    Years since quitting: 29.4   Smokeless tobacco: Never  Substance Use Topics   Alcohol use: Yes    Alcohol/week: 5.0 standard drinks of alcohol    Types: 5 Standard drinks or equivalent per week    Comment: once every 2  months    No Known Allergies  Current Outpatient Medications  Medication Sig Dispense Refill   albuterol (VENTOLIN HFA) 108 (90 Base) MCG/ACT inhaler Inhale 2 puffs into the lungs every 6 (six) hours as needed for wheezing or shortness of breath. 6.7 g 11   apixaban (ELIQUIS) 5 MG TABS tablet Take 1 tablet (5 mg total) by mouth 2 (two) times daily. 60 tablet 3   atorvastatin (LIPITOR) 40 MG tablet Take 1 tablet (40 mg total) by mouth every evening at 6 PM. 90 tablet 3   benazepril-hydrochlorthiazide (LOTENSIN HCT) 20-25 MG tablet Take 1/2 (one half) tablet by mouth daily. 45 tablet 3   cetirizine (ZYRTEC) 10 MG tablet Take 10 mg by mouth daily.     Cyanocobalamin (B-12 PO) Take 1 tablet by mouth daily.     FEROSUL 325 (65 Fe) MG tablet TAKE ONE TABLET BY MOUTH ONE TIME DAILY WITH BREAKFAST (Patient taking differently: Take 325 mg by mouth daily with breakfast.) 90 tablet 3   Fluticasone-Umeclidin-Vilant (TRELEGY ELLIPTA) 100-62.5-25 MCG/ACT AEPB Inhale 1 puff into the lungs daily. 60 each 11   levothyroxine (SYNTHROID) 25 MCG tablet Take 1 tablet (25 mcg total) by mouth daily at 6 (six) AM. 30 tablet 1   Multiple Vitamin (MULTIVITAMIN) tablet Take  1 tablet by mouth daily.     valACYclovir (VALTREX) 1000 MG tablet Take 1 g by mouth daily.     No current facility-administered medications for this visit.    Review of Systems  Constitutional:  Constitutional negative. HENT: HENT negative.  Eyes: Eyes negative.  Respiratory: Positive for shortness of breath.  GI: Gastrointestinal negative.  Musculoskeletal: Musculoskeletal negative.  Neurological: Neurological negative. Hematologic: Positive for bruises/bleeds easily.  Psychiatric: Psychiatric negative.        Objective:  Objective   Vitals:   05/12/23 1036  BP: 125/74  Pulse: 83  Resp: 20  Temp: 98.1 F (36.7 C)  SpO2: 93%  Weight: 211 lb 3.2 oz (95.8 kg)  Height: 5' 10.5" (1.791 m)   Body mass index is 29.88  kg/m.  Physical Exam HENT:     Head: Normocephalic.     Nose: Nose normal.  Eyes:     Pupils: Pupils are equal, round, and reactive to light.  Cardiovascular:     Rate and Rhythm: Normal rate.  Abdominal:     General: Abdomen is flat.  Musculoskeletal:        General: Normal range of motion.     Cervical back: Normal range of motion and neck supple.     Right lower leg: No edema.     Left lower leg: No edema.  Skin:    General: Skin is warm.     Capillary Refill: Capillary refill takes less than 2 seconds.  Neurological:     General: No focal deficit present.     Mental Status: He is alert.  Psychiatric:        Mood and Affect: Mood normal.     Data: CTA reviewed with patient and wife.     Assessment/Plan:     77 year old male recently admitted for pulmonary embolus now here for follow-up of abdominal aortic aneurysm.  Patient will need continued anticoagulation until at least 1 December but will need anticoagulation after that for recent pulmonary embolus.  Given his risk of rupture of his aneurysm he is concerned to wait until he has completed 6 months of therapy.  We reviewed his CT scan together today patient would need Zenith fenestrated graft with stenting of his bilateral renal arteries and this was diagrammed for the patient he demonstrates good mood in the presence of his wife.  Will have the graft of constructed and we will get him scheduled after December 1.  He will need to hold Eliquis for 48 hours prior to procedure.    Maeola Harman MD [facility]

## 2023-05-17 DIAGNOSIS — R1314 Dysphagia, pharyngoesophageal phase: Secondary | ICD-10-CM | POA: Diagnosis not present

## 2023-05-17 DIAGNOSIS — R490 Dysphonia: Secondary | ICD-10-CM | POA: Diagnosis not present

## 2023-05-17 DIAGNOSIS — K219 Gastro-esophageal reflux disease without esophagitis: Secondary | ICD-10-CM | POA: Diagnosis not present

## 2023-05-25 NOTE — Progress Notes (Signed)
Subjective:    Patient ID: Kyle [name], male    DOB: 1946/07/01, 77 y.o.   MRN: [id number]  HPI   male former smoker followed for OSA, COPD GOLD III, Rhinitis Complicated by GERD, CAD/MI, HBP Unattended Home Sleep Test-01/05/2014, moderate OSA, AHI 19/hour, weight 223 pounds BIPAP titration 03/18/18- Office Spirometry 09/10/2016-severe obstructive airways disease: FVC 2.83/61%, FEV1 1.17/34%, ratio 0.41, FEF 25-75% 0.35/13% PFT 09/22/16-severe COPD, no response to dilator, diffusion moderately reduced, air trapping, hyperinflation. FVC 3.54/75%, FEV1 1.7 2/49%, ratio 0.49, TLC 123%, DLCO 56% O2 Qualifying Walk Test 01/17/21- 3 laps x 250 ft, lowest sat 94%, max HR 83. Nurse reports "good steady pace, able to talk as well". -----------------------------------------------------------------   05/26/21- 77 year old male former smoker followed for OSA, COPD, rhinitis complicated by GERD, CAD/MI, HBP, Covid infection/ Paxlovid [name],  BIPAP 15/11/ PS 3 [facility] -Trelegy 100, albuterol hfa Covid vax- 4[employer]izer Body weight today-239 lbs Download- compliance 100%, AHI 4.9/ hr Flu vax- had Doing Pulmonary Rehab for COPD-feels that is some help.  Anticipates return to [employer]. He is comfortable with CPAP.  Download reviewed. CTa chest aorta 01/29/21- Lungs/Pleura: Paraseptal and centrilobular emphysema. No confluent airspace disease. Mild reticular opacities in the dependent aspects of the lungs compatible with atelectasis. Mild linear scarring/architectural distortion of the right middle lobe, bilateral lower lobes.  No suspicious nodules. No pneumothorax or pleural effusion  05/27/23- 77 yoM  former smoker followed for OSA, COPD, rhinitis complicated by GERD, CAD/MI, Abdominal Aortic Aneurysm, CHF, HBP, Covid infection/ Paxlovid [name], Pulmonary Embolism 04/04/23/ Eliquis, Hypothyroid,  BIPAP 15/11/ PS 3 [facility]    [employer] Auto[employer]IPAP -Trelegy 100, albuterol hfa Body weight  today-208 lbs Download- compliance -100%, AHI 3.5/hr Doppler leg veins negative while hosp in September for PE. Had flu vax Had laryngoscopy . Download reviewed. Machine due for replacement. CTaChest, abd,pelvis 04/04/23-  IMPRESSION: 1. Positive for isolated occlusive pulmonary embolus within a segmental branch of the right middle lobe pulmonary artery. There is associated wedge-shaped pulmonary airspace opacity extending to the pleural surface consistent with a focus of infarct versus reperfusion injury. This likely only the patient's clinical symptoms of pleuritic chest pain. 2. No evidence of acute aortic syndrome in the chest. 3. Enlarging infrarenal abdominal aortic aneurysm now measuring up to 5.8 cm compared to a maximum of 5.2 cm 1 year previously. Recommend referral to a vascular specialist. This recommendation follows ACR consensus guidelines: White Paper of the ACR Incidental Findings Committee II on Vascular Findings. J Am Coll Radiol 2013; 10:789-794. 4. Stable mild fusiform aneurysmal dilation of the ascending and proximal transverse thoracic aorta with a maximal diameter of 4 cm. Recommend annual imaging followup by CTA or MRA. This recommendation follows 2010 ACCF/AHA/AATS/ACR/ASA/SCA/SCAI/SIR/STS/SVM Guidelines for the Diagnosis and Management of Patients with Thoracic Aortic Disease. Circulation. 2010; 121: V784-O962. 5. Moderately severe combined paraseptal and centrilobular pulmonary emphysema. 6. Aortic and coronary artery atherosclerotic vascular calcifications. 7. 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 8. Mild to moderate stenosis of the proximal left renal artery. 9. Additional ancillary findings as above. Aortic aneurysm NOS (ICD10-I71.9); Aortic Atherosclerosis (ICD10-I70.0) and Emphysema (ICD10-J43.9).     ROS-see HPI   + = positive Constitutional:    weight loss, night sweats, fevers, chills, fatigue, lassitude. HEENT:     headaches, difficulty swallowing, tooth/dental problems, sore throat,       sneezing, itching, ear ache, nasal congestion, post nasal drip, snoring CV:    chest pain, orthopnea, PND, swelling  in lower extremities, anasarca,                                                           dizziness, palpitations Resp:   +shortness of breath with exertion or at rest.                +productive cough,   non-productive cough, coughing up of blood.              change in color of mucus.  +wheezing.   Skin:    rash or lesions. GI:  No-   heartburn, indigestion, abdominal pain, nausea, vomiting, diarrhea,                 change in bowel habits, loss of appetite GU: dysuria, change in color of urine, no urgency or frequency.   flank pain. MS:   joint pain, stiffness, decreased range of motion, back pain. Neuro-     nothing unusual Psych:  change in mood or affect.  depression or anxiety.   memory loss.     Objective:  OBJ- Physical Exam General- Alert, Oriented, Affect-appropriate, Distress- none acute, + overweight Skin- rash-none, lesions- none, excoriation- none Lymphadenopathy- none Head- atraumatic            Eyes- Gross vision intact, PERRLA, conjunctivae and secretions clear            Ears- Hearing, canals-normal            Nose- Clear, no-Septal dev, mucus, polyps, erosion, perforation,              Throat- Mallampati III , mucosa clear , drainage- none, tonsils- atrophic Neck- flexible , trachea midline, no stridor , thyroid nl, carotid no bruit Chest - symmetrical excursion , unlabored           Heart/CV- RRR , no murmur , no gallop  , no rub, nl s1 s2                           - JVD- none , edema- none, stasis changes- none, varices- none           Lung- + unlabored, +Clear/ diminished, wheeze- none, cough- none , dullness-none, rub- none           Chest wall-  Abd-  Br/ Gen/ Rectal- Not done, not indicated Extrem- cyanosis- none, clubbing, none, atrophy- none, strength- nl Neuro-  grossly intact to observation  Assessment & Plan:

## 2023-05-27 ENCOUNTER — Encounter: Payer: Self-pay | Admitting: Internal Medicine

## 2023-05-27 ENCOUNTER — Ambulatory Visit: Payer: Medicare HMO | Admitting: Internal Medicine

## 2023-05-27 VITALS — BP 124/82 | HR 105 | Ht 71.0 in | Wt 208.0 lb

## 2023-05-27 DIAGNOSIS — J449 Chronic obstructive pulmonary disease, unspecified: Secondary | ICD-10-CM

## 2023-05-27 DIAGNOSIS — I7143 Infrarenal abdominal aortic aneurysm, without rupture: Secondary | ICD-10-CM

## 2023-05-27 DIAGNOSIS — G4733 Obstructive sleep apnea (adult) (pediatric): Secondary | ICD-10-CM

## 2023-05-27 MED ORDER — TRELEGY ELLIPTA 100-62.5-25 MCG/ACT IN AEPB: 1.0000 | INHALATION_SPRAY | Freq: Every day | RESPIRATORY_TRACT | 11 refills | Status: DC

## 2023-05-27 MED ORDER — ALBUTEROL SULFATE HFA 108 (90 BASE) MCG/ACT IN AERS: 2.0000 | INHALATION_SPRAY | Freq: Four times a day (QID) | RESPIRATORY_TRACT | 11 refills | Status: AC | PRN

## 2023-05-27 NOTE — Patient Instructions (Signed)
Order- DME [facility] please replace old auto BIPAP machine, 15/11, PS3.  Please also refit mask of choice- current mask is irritating bridge of nose, Continue humidifier, supplies and please install download capability-Airview/ card  Trelegy and albuterol inhalers refilled  Good luck with your aneurysm surgery.

## 2023-06-01 DIAGNOSIS — G4733 Obstructive sleep apnea (adult) (pediatric): Secondary | ICD-10-CM | POA: Diagnosis not present

## 2023-06-14 ENCOUNTER — Other Ambulatory Visit: Payer: Medicare HMO

## 2023-06-14 DIAGNOSIS — E039 Hypothyroidism, unspecified: Secondary | ICD-10-CM

## 2023-06-14 LAB — TSH: TSH: 0 u[IU]/mL — ABNORMAL LOW (ref 0.35–5.50)

## 2023-06-18 ENCOUNTER — Encounter: Payer: Self-pay | Admitting: Cardiology

## 2023-06-18 ENCOUNTER — Encounter: Payer: Self-pay | Admitting: Vascular Surgery

## 2023-06-18 ENCOUNTER — Ambulatory Visit: Payer: Medicare HMO | Attending: Cardiology | Admitting: Cardiology

## 2023-06-18 VITALS — BP 136/60 | HR 82 | Ht 71.0 in | Wt 208.0 lb

## 2023-06-18 DIAGNOSIS — E785 Hyperlipidemia, unspecified: Secondary | ICD-10-CM | POA: Diagnosis not present

## 2023-06-18 DIAGNOSIS — I7143 Infrarenal abdominal aortic aneurysm, without rupture: Secondary | ICD-10-CM

## 2023-06-18 DIAGNOSIS — I2699 Other pulmonary embolism without acute cor pulmonale: Secondary | ICD-10-CM

## 2023-06-18 DIAGNOSIS — I2510 Atherosclerotic heart disease of native coronary artery without angina pectoris: Secondary | ICD-10-CM

## 2023-06-18 DIAGNOSIS — Z01810 Encounter for preprocedural cardiovascular examination: Secondary | ICD-10-CM

## 2023-06-18 DIAGNOSIS — J449 Chronic obstructive pulmonary disease, unspecified: Secondary | ICD-10-CM | POA: Diagnosis not present

## 2023-06-18 MED ORDER — APIXABAN 5 MG PO TABS: 5.0000 mg | ORAL_TABLET | Freq: Two times a day (BID) | ORAL | 5 refills | Status: DC

## 2023-06-18 MED ORDER — OMEPRAZOLE 40 MG PO CPDR: 40.0000 mg | DELAYED_RELEASE_CAPSULE | Freq: Every day | ORAL | 11 refills | Status: DC

## 2023-06-18 MED ORDER — ATORVASTATIN CALCIUM 40 MG PO TABS: 40.0000 mg | ORAL_TABLET | Freq: Every evening | ORAL | 3 refills | Status: DC

## 2023-06-18 NOTE — Patient Instructions (Signed)
Medication Instructions:  The current medical regimen is effective;  continue present plan and medications.  *If you need a refill on your cardiac medications before your next appointment, please call your pharmacy*  Follow-Up: At [facility], you and your health needs are our priority.  As part of our continuing mission to provide you with exceptional heart care, we have created designated Provider Care Teams.  These Care Teams include your primary Cardiologist (physician) and Advanced Practice Providers (APPs -  Physician Assistants and Nurse Practitioners) who all work together to provide you with the care you need, when you need it.  We recommend signing up for the patient portal called "MyChart".  Sign up information is provided on this After Visit Summary.  MyChart is used to connect with patients for Virtual Visits (Telemedicine).  Patients are able to view lab/test results, encounter notes, upcoming appointments, etc.  Non-urgent messages can be sent to your provider as well.   To learn more about what you can do with MyChart, go to [email or web address].    Your next appointment:   6 month(s)  Provider:   Candee Furbish, MD

## 2023-06-18 NOTE — Progress Notes (Signed)
Cardiology Office Note:  .   Date:  06/18/2023  ID:  Kyle [name], DOB 15-Jul-1946, MRN [id number] PCP: Kristian Covey, MD  [facility] Providers Cardiologist:  Donato Schultz, MD     History of Present Illness: .   Kyle [name] is a 77 y.o. male Discussed with the use of AI scribe software   History of Present Illness   The patient, a 77 year old male with a history of coronary artery disease, COPD, and mobility issues, is scheduled for vascular surgery. The patient had a prior inferior infarction, RCA, ST elevation myocardial infarction in 2011, and a cardiac catheterization in 2017, which showed a patent stent in the right coronary artery. Recently, the patient's COPD has been acting up, with a chest x-ray in 2023 showing hyperinflation of the lungs. However, a stress test in the same year was low risk with no ischemia.  The patient has been experiencing difficulty with mobility but has been actively trying to lose weight through diet and exercise. The patient's diet includes various soups and sauteed vegetables, with a reduction in chocolate and other sweets. Despite this, the patient admits to occasional indulgences such as ice cream. The patient has been going to the gym three times a week, focusing on exercises from the hips up and walking at least a mile and a half each time.  In September, the patient developed a blood clot and was put on blood thinners. Despite this, the patient has continued to exercise, albeit with reduced weights and increased repetitions. The patient has noticed an improvement in his ability to bend over and do things for several minutes without feeling restricted.  The patient is aware that he will need to stop taking blood thinners three days before the surgery and that recovery [name] take several weeks. The patient is not overly concerned about the surgery, feeling fine at present and continuing to exercise and lose weight. The patient's blood work  shows a creatinine of 0.89, hemoglobin of 13.3, LDL of 55, and hemoglobin W2N of 7.1.         Studies Reviewed: .        Results LABS Creatinine: 0.89 (06/17/2023) Hemoglobin: 13.3 (06/17/2023) LDL: 55 (06/17/2023) Hemoglobin A1c: 7.1 (06/17/2023)  RADIOLOGY Chest x-ray: Hyperinflation of the lungs (2023) CT Pulmonary Angiography: Isolated occlusive pulmonary embolus within a segmental branch of the right middle lobe artery (04/04/2023)  DIAGNOSTIC Cardiac catheterization: Patent stent in the right coronary artery (05/05/2016) Stress test: Low risk with no ischemia (2023)  Risk Assessment/Calculations:            Physical Exam:   VS:  BP 136/60   Pulse 82   Ht 5\' 11"  (1.803 m)   Wt 208 lb (94.3 kg)   SpO2 96%   BMI 29.01 kg/m    Wt Readings from Last 3 Encounters:  06/18/23 208 lb (94.3 kg)  05/27/23 208 lb (94.3 kg)  05/12/23 211 lb 3.2 oz (95.8 kg)    GEN: Well nourished, well developed in no acute distress NECK: No JVD; No carotid bruits CARDIAC: RRR, no murmurs, no rubs, no gallops RESPIRATORY:  Clear to auscultation without rales, wheezing or rhonchi  ABDOMEN: Soft, non-tender, non-distended EXTREMITIES:  No edema; No deformity   ASSESSMENT AND PLAN: .    Assessment and Plan    Preoperative Evaluation for Vascular Surgery 77 year old scheduled for vascular surgery due to a 5.8 cm aneurysm. Awaiting stent from [place], surgery planned for late December. Recent stress test (  2023) showed low risk, no ischemia. No current chest pain. Discussed surgical risks (bleeding, infection, anesthesia complications) and benefits (preventing aneurysm rupture). Alternatives include monitoring with increased rupture risk. Patient consents to proceed. - Proceed with preoperative evaluation - Ensure stent arrival and schedule surgery for late December - Discontinue Eliquis 3 days prior to surgery  Pulmonary Embolism (PE) Isolated occlusive PE in segmental branch of right  middle lobe artery. On Eliquis since 04/04/2023. Fairly small branch. Discussed risks of discontinuing Eliquis for surgery (clot recurrence) and benefits (reduced bleeding risk). Patient consents to plan. - Continue Eliquis for 3-6 months. OK to hold prior to surgery.  -  Coronary Artery Disease (CAD) Inferior infarction, RCA, STEMI in 2011. Cardiac catheterization (05/05/2016) showed patent stent in RCA. Recent stress test (2023) low risk, no ischemia. No current chest pain. Patient prefers current management. - Continue monitoring CAD - No changes in current management  Chronic Obstructive Pulmonary Disease (COPD) COPD exacerbation. Prior chest x-ray (2023) showed hyperinflation. Reports mobility difficulty but can walk 1.5 miles and exercise. Patient prefers current management and regular exercise. - Continue current COPD management - Encourage regular exercise as tolerated  General Health Maintenance Managing weight and diet, goal to lose 10 pounds. Engages in regular exercise and dietary changes. Prefers current regimen. - Encourage continued weight management and healthy diet - Support regular exercise regimen  Follow-up - Schedule follow-up appointment in 6 months.               Signed, Donato Schultz, MD

## 2023-06-21 ENCOUNTER — Other Ambulatory Visit: Payer: Self-pay | Admitting: Gastroenterology

## 2023-06-21 ENCOUNTER — Other Ambulatory Visit: Payer: Self-pay | Admitting: Cardiology

## 2023-06-22 ENCOUNTER — Other Ambulatory Visit: Payer: Self-pay

## 2023-06-22 DIAGNOSIS — I7143 Infrarenal abdominal aortic aneurysm, without rupture: Secondary | ICD-10-CM

## 2023-06-23 ENCOUNTER — Encounter: Payer: Self-pay | Admitting: Internal Medicine

## 2023-06-23 NOTE — Assessment & Plan Note (Signed)
Benefit from Trelegy. Feels well-controlled

## 2023-06-23 NOTE — Assessment & Plan Note (Signed)
Benefits from BIPAP Plan- [employer] to replace old [employer] BIPAP auto 15/11, PS3, and refit mask.

## 2023-06-23 NOTE — Assessment & Plan Note (Signed)
Being followed

## 2023-07-02 DIAGNOSIS — G4733 Obstructive sleep apnea (adult) (pediatric): Secondary | ICD-10-CM | POA: Diagnosis not present

## 2023-07-14 NOTE — Progress Notes (Signed)
Surgical Instructions   Your procedure is scheduled on December 17. Report to [facility] Main Entrance "A" at 5:30 A.M., then check in with the Admitting office. Any questions or running late day of surgery: call [phone]  Questions prior to your surgery date: call [phone], Monday-Friday, 8am-4pm. If you experience any cold or flu symptoms such as cough, fever, chills, shortness of breath, etc. between now and your scheduled surgery, please notify us at the above number.     Remember:  Do not eat or drink anything after midnight the night before your surgery   Take these medicines the morning of surgery with A SIP OF WATER  cetirizine (ZYRTEC)  (TRELEGY ELLIPTA)  omeprazole (PRILOSEC)  valACYclovir (VALTREX)   May take these medicines IF NEEDED: albuterol (VENTOLIN HFA) 108 (90 Base) MCG/ACT inhaler-Bring inhaler to the hospital         HOLD ELIQUIS 48 HOURS PRIOR TO SURGERY AS INSTRUCTED BY DR. Randie Heinz. LAST DOSE SHOULD BE DECEMBER 14.  One week prior to surgery, STOP taking any Aspirin (unless otherwise instructed by your surgeon) Aleve, Naproxen, Ibuprofen, Motrin, Advil, Goody's, BC's, all herbal medications, fish oil, and non-prescription vitamins.                     Do NOT Smoke (Tobacco/Vaping) for 24 hours prior to your procedure.  If you use a CPAP at night, you may bring your mask/headgear for your overnight stay.   You will be asked to remove any contacts, glasses, piercing's, hearing aid's, dentures/partials prior to surgery. Please bring cases for these items if needed.    Patients discharged the day of surgery will not be allowed to drive home, and someone needs to stay with them for 24 hours.  SURGICAL WAITING ROOM VISITATION Patients may have no more than 2 support people in the waiting area - these visitors may rotate.   Pre-op nurse will coordinate an appropriate time for 1 ADULT support person, who may not rotate, to accompany patient in pre-op.   Children under the age of 86 must have an adult with them who is not the patient and must remain in the main waiting area with an adult.  If the patient needs to stay at the hospital during part of their recovery, the visitor guidelines for inpatient rooms apply.  Please refer to the [facility] website for the visitor guidelines for any additional information.   If you received a COVID test during your pre-op visit  it is requested that you wear a mask when out in public, stay away from anyone that may not be feeling well and notify your surgeon if you develop symptoms. If you have been in contact with anyone that has tested positive in the last 10 days please notify you surgeon.      Pre-operative CHG Bathing Instructions   You can play a key role in reducing the risk of infection after surgery. Your skin needs to be as free of germs as possible. You can reduce the number of germs on your skin by washing with CHG (chlorhexidine gluconate) soap before surgery. CHG is an antiseptic soap that kills germs and continues to kill germs even after washing.   DO NOT use if you have an allergy to chlorhexidine/CHG or antibacterial soaps. If your skin becomes reddened or irritated, stop using the CHG and notify one of our RNs at [phone].              TAKE A SHOWER  THE NIGHT BEFORE SURGERY AND THE DAY OF SURGERY    Please keep in mind the following:  DO NOT shave, including legs and underarms, 48 hours prior to surgery.   You may shave your face before/day of surgery.  Place clean sheets on your bed the night before surgery Use a clean washcloth (not used since being washed) for each shower. DO NOT sleep with pet's night before surgery.  CHG Shower Instructions:  Wash your face and private area with normal soap. If you choose to wash your hair, wash first with your normal shampoo.  After you use shampoo/soap, rinse your hair and body thoroughly to remove shampoo/soap residue.  Turn the  water OFF and apply half the bottle of CHG soap to a CLEAN washcloth.  Apply CHG soap ONLY FROM YOUR NECK DOWN TO YOUR TOES (washing for 3-5 minutes)  DO NOT use CHG soap on face, private areas, open wounds, or sores.  Pay special attention to the area where your surgery is being performed.  If you are having back surgery, having someone wash your back for you may be helpful. Wait 2 minutes after CHG soap is applied, then you may rinse off the CHG soap.  Pat dry with a clean towel  Put on clean pajamas    Additional instructions for the day of surgery: DO NOT APPLY any lotions, deodorants, cologne, or perfumes.   Do not wear jewelry or makeup Do not wear nail polish, gel polish, artificial nails, or any other type of covering on natural nails (fingers and toes) Do not bring valuables to the hospital. [facility] is not responsible for valuables/personal belongings. Put on clean/comfortable clothes.  Please brush your teeth.  Ask your nurse before applying any prescription medications to the skin.

## 2023-07-15 ENCOUNTER — Other Ambulatory Visit (HOSPITAL_COMMUNITY): Payer: Medicare HMO

## 2023-07-15 ENCOUNTER — Other Ambulatory Visit: Payer: Self-pay

## 2023-07-15 ENCOUNTER — Encounter (HOSPITAL_COMMUNITY)
Admission: RE | Admit: 2023-07-15 | Discharge: 2023-07-15 | Disposition: A | Discharge status: Home / Self Care | Payer: Medicare HMO | Source: Ambulatory Visit | Attending: Vascular Surgery | Admitting: Vascular Surgery

## 2023-07-15 ENCOUNTER — Encounter (HOSPITAL_COMMUNITY): Payer: Self-pay

## 2023-07-15 VITALS — BP 123/59 | HR 88 | Temp 98.5°F | Resp 18 | Ht 71.0 in | Wt 204.6 lb

## 2023-07-15 DIAGNOSIS — I252 Old myocardial infarction: Secondary | ICD-10-CM | POA: Insufficient documentation

## 2023-07-15 DIAGNOSIS — I2510 Atherosclerotic heart disease of native coronary artery without angina pectoris: Secondary | ICD-10-CM | POA: Diagnosis not present

## 2023-07-15 DIAGNOSIS — I723 Aneurysm of iliac artery: Secondary | ICD-10-CM | POA: Diagnosis not present

## 2023-07-15 DIAGNOSIS — Z7901 Long term (current) use of anticoagulants: Secondary | ICD-10-CM | POA: Insufficient documentation

## 2023-07-15 DIAGNOSIS — I700 Atherosclerosis of aorta: Secondary | ICD-10-CM | POA: Diagnosis not present

## 2023-07-15 DIAGNOSIS — Z01818 Encounter for other preprocedural examination: Secondary | ICD-10-CM | POA: Diagnosis not present

## 2023-07-15 DIAGNOSIS — Z955 Presence of coronary angioplasty implant and graft: Secondary | ICD-10-CM | POA: Diagnosis not present

## 2023-07-15 DIAGNOSIS — I119 Hypertensive heart disease without heart failure: Secondary | ICD-10-CM | POA: Diagnosis not present

## 2023-07-15 DIAGNOSIS — G4733 Obstructive sleep apnea (adult) (pediatric): Secondary | ICD-10-CM | POA: Insufficient documentation

## 2023-07-15 DIAGNOSIS — E039 Hypothyroidism, unspecified: Secondary | ICD-10-CM | POA: Diagnosis not present

## 2023-07-15 DIAGNOSIS — K219 Gastro-esophageal reflux disease without esophagitis: Secondary | ICD-10-CM | POA: Diagnosis not present

## 2023-07-15 DIAGNOSIS — Z86711 Personal history of pulmonary embolism: Secondary | ICD-10-CM | POA: Insufficient documentation

## 2023-07-15 DIAGNOSIS — J439 Emphysema, unspecified: Secondary | ICD-10-CM | POA: Insufficient documentation

## 2023-07-15 DIAGNOSIS — I7143 Infrarenal abdominal aortic aneurysm, without rupture: Secondary | ICD-10-CM | POA: Diagnosis not present

## 2023-07-15 LAB — TYPE AND SCREEN
ABO/RH(D): A POS
Antibody Screen: NEGATIVE

## 2023-07-15 LAB — URINALYSIS, ROUTINE W REFLEX MICROSCOPIC
Bilirubin Urine: NEGATIVE
Glucose, UA: NEGATIVE mg/dL
Hgb urine dipstick: NEGATIVE
Ketones, ur: NEGATIVE mg/dL
Leukocytes,Ua: NEGATIVE
Nitrite: NEGATIVE
Protein, ur: NEGATIVE mg/dL
Specific Gravity, Urine: 1.006 (ref 1.005–1.030)
pH: 7 (ref 5.0–8.0)

## 2023-07-15 LAB — COMPREHENSIVE METABOLIC PANEL
ALT: 38 U/L (ref 0–44)
AST: 35 U/L (ref 15–41)
Albumin: 3.6 g/dL (ref 3.5–5.0)
Alkaline Phosphatase: 84 U/L (ref 38–126)
Anion gap: 9 (ref 5–15)
BUN: 14 mg/dL (ref 8–23)
CO2: 25 mmol/L (ref 22–32)
Calcium: 9.9 mg/dL (ref 8.9–10.3)
Chloride: 106 mmol/L (ref 98–111)
Creatinine, Ser: 0.87 mg/dL (ref 0.61–1.24)
GFR, Estimated: >60 mL/min (ref >60)
Glucose, Bld: 120 mg/dL — ABNORMAL HIGH (ref 70–99)
Potassium: 3.9 mmol/L (ref 3.5–5.1)
Sodium: 140 mmol/L (ref 135–145)
Total Bilirubin: 0.9 mg/dL (ref <1.2)
Total Protein: 6.6 g/dL (ref 6.5–8.1)

## 2023-07-15 LAB — SURGICAL PCR SCREEN
MRSA, PCR: NEGATIVE
Staphylococcus aureus: NEGATIVE

## 2023-07-15 LAB — CBC
HCT: 38.9 % — ABNORMAL LOW (ref 39.0–52.0)
Hemoglobin: 13.2 g/dL (ref 13.0–17.0)
MCH: 32.5 pg (ref 26.0–34.0)
MCHC: 33.9 g/dL (ref 30.0–36.0)
MCV: 95.8 fL (ref 80.0–100.0)
Platelets: 164 10*3/uL (ref 150–400)
RBC: 4.06 MIL/uL — ABNORMAL LOW (ref 4.22–5.81)
RDW: 12.5 % (ref 11.5–15.5)
WBC: 5.7 10*3/uL (ref 4.0–10.5)
nRBC: 0 % (ref 0.0–0.2)

## 2023-07-15 LAB — PROTIME-INR
INR: 1.4 — ABNORMAL HIGH (ref 0.8–1.2)
Prothrombin Time: 17.4 s — ABNORMAL HIGH (ref 11.4–15.2)

## 2023-07-15 LAB — APTT: aPTT: 37 s — ABNORMAL HIGH (ref 24–36)

## 2023-07-15 NOTE — Progress Notes (Signed)
PCP - Smitty Cords Burchette,MD Cardiologist - Donato Schultz, MD- last office visit 06/18/23 Pulmonologist - Jason Coop- last office visit- 05/27/23  PPM/ICD - denies Device Orders -  Rep Notified -   Chest x-ray - 04/04/23 EKG - 07/15/23 Stress Test - 06/18/22 ECHO - 04/05/23 Cardiac Cath - 05/05/16  Sleep Study - 2019 CPAP - BiPAP  Fasting Blood Sugar - na Checks Blood Sugar _____ times a day  Last dose of GLP1 agonist-  na GLP1 instructions:   Blood Thinner Instructions:HOLD ELIQUIS 48 HOURS PRIOR TO SURGERY AS INSTRUCTED BY DR. Randie Heinz. LAST DOSE SHOULD BE DECEMBER 14.  Aspirin Instructions:na  ERAS Protcol -no PRE-SURGERY Ensure or G2-   COVID TEST- na   Anesthesia review: yes- pt's wife diagnosed with shingles in early November. According to patient, his wife was vaccinated against shingles. She did not develop any open lesions, but was having pain on the side of her head and her ear. She was diagnosed by her PCP,Dr. Burchette. Pt said her symptoms resolved approximately three weeks ago. He has had no symptoms and is taking Valtrex daily. He is also vaccinated against shingles. He did not wear a mask in wife's presence.   Patient denies shortness of breath, fever, cough and chest pain at PAT appointment   All instructions explained to the patient, with a verbal understanding of the material. Patient agrees to go over the instructions while at home for a better understanding. Patient also instructed to wear a mask when out in public prior to surgery. The opportunity to ask questions was provided.

## 2023-07-16 ENCOUNTER — Encounter (HOSPITAL_COMMUNITY): Payer: Self-pay

## 2023-07-16 NOTE — Progress Notes (Signed)
Case: [id number] Date/Time: 07/20/23 0715   Procedure: ABDOMINAL AORTIC ENDOVASCULAR FENESTRATED STENT GRAFT   Anesthesia type: General   Pre-op diagnosis: Infrarenal abdominal aortic aneurysm without rupture   [facility] ROOM [facility] / [facility]   Surgeons: Maeola Harman, MD       DISCUSSION: Kyle [name] is a 77 yo male with PMH of former smoking, CAD s/p PCI (2011), hx of MI (2011), hx of PE (on Eliquis), AAA, severe COPD, moderate OSA (uses Bipap), GERD, hypothyroidism.  Patient was admitted from 9/1-9/2 for an acute PE. Presented with CP/SOB and was found to have occlusive pulmonary embolus within a segmental branch of the right middle lobe pulmonary artery with associated pulmonary infarct. He was started on anticoagulation. AAA noted to be 5.8cm. Vascular was consulted while inpatient and it was recommended his AAA was repaired as outpatient.  Patient follows with Cardiology for CAD and hx of MI. He was last seen in clinic on 11/15 by Dr. Anne Fu. Noted to be overall stable. Cleared for surgery:  "Preoperative Evaluation for Vascular Surgery 77 year old scheduled for vascular surgery due to a 5.8 cm aneurysm. Awaiting stent from [place], surgery planned for late December. Recent stress test (2023) showed low risk, no ischemia. No current chest pain. Discussed surgical risks (bleeding, infection, anesthesia complications) and benefits (preventing aneurysm rupture). Alternatives include monitoring with increased rupture risk. Patient consents to proceed. - Proceed with preoperative evaluation - Ensure stent arrival and schedule surgery for late December - Discontinue Eliquis 3 days prior to surgery"  He follows with Pulmonology for his COPD. Seen on 05/27/23 for pre op clearance. Breathing noted to be stable on inhalers. Compliant with bipap for OSA.   Last dose of Eliquis is 12/14  VS: BP (!) 123/59   Pulse 88   Temp 36.9 C (Oral)   Resp 18   Ht 5\' 11"  (1.803 m)   Wt  92.8 kg   SpO2 96%   BMI 28.54 kg/m   PROVIDERS: Kristian Covey, MD Cardiologist - Donato Schultz, MD- last office visit 06/18/23 Pulmonologist - Jason Coop- last office visit- 05/27/23  LABS: Labs reviewed: Acceptable for surgery. (all labs ordered are listed, but only abnormal results are displayed)  Labs Reviewed  CBC - Abnormal; Notable for the following components:      Result Value   RBC 4.06 (*)    HCT 38.9 (*)    All other components within normal limits  COMPREHENSIVE METABOLIC PANEL - Abnormal; Notable for the following components:   Glucose, Bld 120 (*)    All other components within normal limits  PROTIME-INR - Abnormal; Notable for the following components:   Prothrombin Time 17.4 (*)    INR 1.4 (*)    All other components within normal limits  APTT - Abnormal; Notable for the following components:   aPTT 37 (*)    All other components within normal limits  SURGICAL PCR SCREEN  URINALYSIS, ROUTINE W REFLEX MICROSCOPIC  TYPE AND SCREEN     IMAGES:  CTA chest/abd/pelvis 04/04/23:   IMPRESSION: 1. Positive for isolated occlusive pulmonary embolus within a segmental branch of the right middle lobe pulmonary artery. There is associated wedge-shaped pulmonary airspace opacity extending to the pleural surface consistent with a focus of infarct versus reperfusion injury. This likely only the patient's clinical symptoms of pleuritic chest pain. 2. No evidence of acute aortic syndrome in the chest. 3. Enlarging infrarenal abdominal aortic aneurysm now measuring up to 5.8 cm compared to a  maximum of 5.2 cm 1 year previously. Recommend referral to a vascular specialist. This recommendation follows ACR consensus guidelines: White Paper of the ACR Incidental Findings Committee II on Vascular Findings. J Am Coll Radiol 2013; 10:789-794. 4. Stable mild fusiform aneurysmal dilation of the ascending and proximal transverse thoracic aorta with a maximal diameter  of 4 cm. Recommend annual imaging followup by CTA or MRA. This recommendation follows 2010 ACCF/AHA/AATS/ACR/ASA/SCA/SCAI/SIR/STS/SVM Guidelines for the Diagnosis and Management of Patients with Thoracic Aortic Disease. Circulation. 2010; 121: T016-W109. 5. Moderately severe combined paraseptal and centrilobular pulmonary emphysema. 6. Aortic and coronary artery atherosclerotic vascular calcifications. 7. 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 8. Mild to moderate stenosis of the proximal left renal artery. 9. Additional ancillary findings as above.   Aortic aneurysm NOS (ICD10-I71.9); Aortic Atherosclerosis (ICD10-I70.0) and Emphysema (ICD10-J43.9).    EKG 07/15/23:  NSR, rate 82  CV:  Echo 04/05/23:  IMPRESSIONS    1. Left ventricular ejection fraction, by estimation, is 60 to 65%. The left ventricle has normal function. The left ventricle has no regional wall motion abnormalities. There is moderate concentric left ventricular hypertrophy. Left ventricular diastolic parameters are consistent with Grade I diastolic dysfunction (impaired relaxation).  2. Right ventricular systolic function is normal. The right ventricular size is mildly enlarged.  3. The mitral valve is grossly normal. Trivial mitral valve regurgitation.  4. The aortic valve was not well visualized. Aortic valve regurgitation is not visualized. Aortic valve mean gradient measures 5.0 mmHg.  5. The inferior vena cava is normal in size with greater than 50% respiratory variability, suggesting right atrial pressure of 3 mmHg.  Comparison(s): Prior images reviewed side by side. LVEF remains normal in 60-65% range. Mild RV enlargement with normal contraction.  Stress test 06/18/22:     The study is normal. The study is low risk.   No ST deviation was noted.   LV perfusion is normal.   Left ventricular function is normal. Nuclear stress EF: 56 %. The left ventricular ejection  fraction is normal (55-65%). End diastolic cavity size is normal.   Prior study not available for comparison.   Low risk stress nuclear study with normal perfusion and normal left ventricular regional and global systolic function.  [facility] 05/05/2016:   The left ventricular systolic function is normal. LV end diastolic pressure is normal. There is no aortic valve stenosis. There is no mitral valve regurgitation. Previously placed right coronary artery mid stent widely patent. 30-40% distal RCA stenosis Minor luminal irregularities in left system.   Reassuring. Nonobstructive coronary artery disease. Patent right coronary stent. Continue with medical management. Donato Schultz, MD  Past Medical History:  Diagnosis Date   Anemia    CAD (coronary artery disease) 08/03/2009   Cardiac arrhythmia    COPD (chronic obstructive pulmonary disease) (HCC)    GERD (gastroesophageal reflux disease)    Hyperlipidemia    Hypertension    Hypothyroidism    Myocardial infarction (HCC) 08/03/2009   Sleep apnea treated with nocturnal BiPAP     Past Surgical History:  Procedure Laterality Date   APPENDECTOMY     CARDIAC CATHETERIZATION N/A 05/05/2016   Procedure: Left Heart Cath and Coronary Angiography;  Surgeon: Jake Bathe, MD;  [facility];  Service: Cardiovascular;  Laterality: N/A;   CORONARY ANGIOPLASTY WITH STENT PLACEMENT     EYE SURGERY     lasix /BIL   TONSILLECTOMY      MEDICATIONS:  albuterol (VENTOLIN HFA)  108 (90 Base) MCG/ACT inhaler   apixaban (ELIQUIS) 5 MG TABS tablet   atorvastatin (LIPITOR) 40 MG tablet   benazepril-hydrochlorthiazide (LOTENSIN HCT) 20-25 MG tablet   cetirizine (ZYRTEC) 10 MG tablet   Cyanocobalamin (B-12 PO)   ferrous sulfate (FEROSUL) 325 (65 FE) MG tablet   Fluticasone-Umeclidin-Vilant (TRELEGY ELLIPTA) 100-62.5-25 MCG/ACT AEPB   Multiple Vitamin (MULTIVITAMIN) tablet   NON FORMULARY   omeprazole (PRILOSEC) 40 MG capsule    valACYclovir (VALTREX) 1000 MG tablet   No current facility-administered medications for this encounter.    Marcille Blanco [facility] Short Stay/Anesthesiology [facility] Phone [phone] 07/16/2023 10:34 AM

## 2023-07-16 NOTE — Anesthesia Preprocedure Evaluation (Signed)
Anesthesia Evaluation  Patient identified by MRN, date of birth, ID band Patient awake    Reviewed: Allergy & Precautions, NPO status , Patient's Chart, lab work & pertinent test results  Airway Mallampati: II  TM Distance: >3 FB Neck ROM: Full    Dental no notable dental hx.    Pulmonary sleep apnea and Continuous Positive Airway Pressure Ventilation , COPD,  COPD inhaler, former smoker   Pulmonary exam normal        Cardiovascular hypertension, Pt. on medications + CAD, + Past MI and + Cardiac Stents  Normal cardiovascular exam  ECHO: 1. Left ventricular ejection fraction, by estimation, is 60 to 65%. The  left ventricle has normal function. The left ventricle has no regional  wall motion abnormalities. There is moderate concentric left ventricular  hypertrophy. Left ventricular  diastolic parameters are consistent with Grade I diastolic dysfunction  (impaired relaxation).   2. Right ventricular systolic function is normal. The right ventricular  size is mildly enlarged.   3. The mitral valve is grossly normal. Trivial mitral valve  regurgitation.   4. The aortic valve was not well visualized. Aortic valve regurgitation  is not visualized. Aortic valve mean gradient measures 5.0 mmHg.   5. The inferior vena cava is normal in size with greater than 50%  respiratory variability, suggesting right atrial pressure of 3 mmHg.     Neuro/Psych  Neuromuscular disease  negative psych ROS   GI/Hepatic Neg liver ROS,GERD  Medicated and Controlled,,  Endo/Other  negative endocrine ROS    Renal/GU Renal disease     Musculoskeletal negative musculoskeletal ROS (+)    Abdominal   Peds  Hematology  (+) Blood dyscrasia (Eliquis)   Anesthesia Other Findings Infrarenal abdominal aortic aneurysm without rupture  Reproductive/Obstetrics                              Anesthesia Physical Anesthesia  Plan  ASA: 3  Anesthesia Plan: General   Post-op Pain Management:    Induction: Intravenous  PONV Risk Score and Plan: 2 and Ondansetron, Dexamethasone and Treatment may vary due to age or medical condition  Airway Management Planned: Oral ETT  Additional Equipment: Arterial line  Intra-op Plan:   Post-operative Plan: Extubation in OR  Informed Consent: I have reviewed the patients History and Physical, chart, labs and discussed the procedure including the risks, benefits and alternatives for the proposed anesthesia with the patient or authorized representative who has indicated his/her understanding and acceptance.     Dental advisory given  Plan Discussed with: CRNA  Anesthesia Plan Comments: (PAT note from 12/12 by Sherlie Ban PA-C )         Anesthesia Quick Evaluation

## 2023-07-20 ENCOUNTER — Other Ambulatory Visit: Payer: Self-pay

## 2023-07-20 ENCOUNTER — Encounter (HOSPITAL_COMMUNITY)
Admission: RE | Disposition: A | Discharge status: Home Health | Payer: Self-pay | Source: Home / Self Care | Attending: Vascular Surgery

## 2023-07-20 ENCOUNTER — Inpatient Hospital Stay (HOSPITAL_COMMUNITY): Payer: Medicare HMO

## 2023-07-20 ENCOUNTER — Ambulatory Visit: Payer: Medicare HMO | Admitting: Nurse Practitioner

## 2023-07-20 ENCOUNTER — Inpatient Hospital Stay (HOSPITAL_COMMUNITY)
Admission: RE | Admit: 2023-07-20 | Discharge: 2023-07-21 | DRG: 269 | Disposition: A | Discharge status: Home Health | Payer: Medicare HMO | Attending: Vascular Surgery | Admitting: Vascular Surgery

## 2023-07-20 ENCOUNTER — Encounter (HOSPITAL_COMMUNITY): Payer: Self-pay | Admitting: Vascular Surgery

## 2023-07-20 ENCOUNTER — Inpatient Hospital Stay (HOSPITAL_COMMUNITY): Payer: Self-pay | Admitting: Medical

## 2023-07-20 DIAGNOSIS — J449 Chronic obstructive pulmonary disease, unspecified: Secondary | ICD-10-CM | POA: Diagnosis present

## 2023-07-20 DIAGNOSIS — Z823 Family history of stroke: Secondary | ICD-10-CM | POA: Diagnosis not present

## 2023-07-20 DIAGNOSIS — Z7901 Long term (current) use of anticoagulants: Secondary | ICD-10-CM | POA: Diagnosis not present

## 2023-07-20 DIAGNOSIS — Z7951 Long term (current) use of inhaled steroids: Secondary | ICD-10-CM

## 2023-07-20 DIAGNOSIS — Z8349 Family history of other endocrine, nutritional and metabolic diseases: Secondary | ICD-10-CM | POA: Diagnosis not present

## 2023-07-20 DIAGNOSIS — Z955 Presence of coronary angioplasty implant and graft: Secondary | ICD-10-CM | POA: Diagnosis not present

## 2023-07-20 DIAGNOSIS — D649 Anemia, unspecified: Secondary | ICD-10-CM | POA: Diagnosis present

## 2023-07-20 DIAGNOSIS — I252 Old myocardial infarction: Secondary | ICD-10-CM

## 2023-07-20 DIAGNOSIS — E785 Hyperlipidemia, unspecified: Secondary | ICD-10-CM | POA: Diagnosis present

## 2023-07-20 DIAGNOSIS — Z86711 Personal history of pulmonary embolism: Secondary | ICD-10-CM

## 2023-07-20 DIAGNOSIS — I701 Atherosclerosis of renal artery: Secondary | ICD-10-CM | POA: Diagnosis not present

## 2023-07-20 DIAGNOSIS — Z87891 Personal history of nicotine dependence: Secondary | ICD-10-CM | POA: Diagnosis not present

## 2023-07-20 DIAGNOSIS — Z820 Family history of epilepsy and other diseases of the nervous system: Secondary | ICD-10-CM | POA: Diagnosis not present

## 2023-07-20 DIAGNOSIS — I2510 Atherosclerotic heart disease of native coronary artery without angina pectoris: Secondary | ICD-10-CM | POA: Diagnosis present

## 2023-07-20 DIAGNOSIS — I7143 Infrarenal abdominal aortic aneurysm, without rupture: Secondary | ICD-10-CM

## 2023-07-20 DIAGNOSIS — K219 Gastro-esophageal reflux disease without esophagitis: Secondary | ICD-10-CM | POA: Diagnosis not present

## 2023-07-20 DIAGNOSIS — I110 Hypertensive heart disease with heart failure: Secondary | ICD-10-CM | POA: Diagnosis not present

## 2023-07-20 DIAGNOSIS — Z79890 Hormone replacement therapy (postmenopausal): Secondary | ICD-10-CM | POA: Diagnosis not present

## 2023-07-20 DIAGNOSIS — G4733 Obstructive sleep apnea (adult) (pediatric): Secondary | ICD-10-CM | POA: Diagnosis present

## 2023-07-20 DIAGNOSIS — Z8616 Personal history of COVID-19: Secondary | ICD-10-CM

## 2023-07-20 DIAGNOSIS — I714 Abdominal aortic aneurysm, without rupture, unspecified: Secondary | ICD-10-CM | POA: Diagnosis not present

## 2023-07-20 DIAGNOSIS — I10 Essential (primary) hypertension: Secondary | ICD-10-CM | POA: Diagnosis not present

## 2023-07-20 DIAGNOSIS — E039 Hypothyroidism, unspecified: Secondary | ICD-10-CM | POA: Diagnosis not present

## 2023-07-20 DIAGNOSIS — Z79899 Other long term (current) drug therapy: Secondary | ICD-10-CM

## 2023-07-20 DIAGNOSIS — Z8249 Family history of ischemic heart disease and other diseases of the circulatory system: Secondary | ICD-10-CM | POA: Diagnosis not present

## 2023-07-20 DIAGNOSIS — I5020 Unspecified systolic (congestive) heart failure: Secondary | ICD-10-CM | POA: Diagnosis not present

## 2023-07-20 HISTORY — PX: ULTRASOUND GUIDANCE FOR VASCULAR ACCESS: SHX6516

## 2023-07-20 HISTORY — PX: ABDOMINAL AORTIC ENDOVASCULAR FENESTRATED STENT GRAFT: SHX6430

## 2023-07-20 LAB — BASIC METABOLIC PANEL
Anion gap: 6 (ref 5–15)
BUN: 14 mg/dL (ref 8–23)
CO2: 24 mmol/L (ref 22–32)
Calcium: 8.6 mg/dL — ABNORMAL LOW (ref 8.9–10.3)
Chloride: 107 mmol/L (ref 98–111)
Creatinine, Ser: 0.96 mg/dL (ref 0.61–1.24)
GFR, Estimated: >60 mL/min (ref >60)
Glucose, Bld: 150 mg/dL — ABNORMAL HIGH (ref 70–99)
Potassium: 4.3 mmol/L (ref 3.5–5.1)
Sodium: 137 mmol/L (ref 135–145)

## 2023-07-20 LAB — CBC
HCT: 35.1 % — ABNORMAL LOW (ref 39.0–52.0)
HCT: 34.6 % — ABNORMAL LOW (ref 39.0–52.0)
Hemoglobin: 11.8 g/dL — ABNORMAL LOW (ref 13.0–17.0)
Hemoglobin: 11.7 g/dL — ABNORMAL LOW (ref 13.0–17.0)
MCH: 32.2 pg (ref 26.0–34.0)
MCH: 32.3 pg (ref 26.0–34.0)
MCHC: 33.6 g/dL (ref 30.0–36.0)
MCHC: 33.8 g/dL (ref 30.0–36.0)
MCV: 95.9 fL (ref 80.0–100.0)
MCV: 95.6 fL (ref 80.0–100.0)
Platelets: 123 10*3/uL — ABNORMAL LOW (ref 150–400)
Platelets: 117 10*3/uL — ABNORMAL LOW (ref 150–400)
RBC: 3.66 MIL/uL — ABNORMAL LOW (ref 4.22–5.81)
RBC: 3.62 MIL/uL — ABNORMAL LOW (ref 4.22–5.81)
RDW: 12.4 % (ref 11.5–15.5)
RDW: 12.3 % (ref 11.5–15.5)
WBC: 6.3 10*3/uL (ref 4.0–10.5)
WBC: 5.6 10*3/uL (ref 4.0–10.5)
nRBC: 0 % (ref 0.0–0.2)
nRBC: 0 % (ref 0.0–0.2)

## 2023-07-20 LAB — MAGNESIUM: Magnesium: 1.7 mg/dL (ref 1.7–2.4)

## 2023-07-20 LAB — CREATININE, SERUM
Creatinine, Ser: 1.05 mg/dL (ref 0.61–1.24)
GFR, Estimated: >60 mL/min (ref >60)

## 2023-07-20 LAB — APTT: aPTT: 38 s — ABNORMAL HIGH (ref 24–36)

## 2023-07-20 LAB — ABO/RH: ABO/RH(D): A POS

## 2023-07-20 LAB — PROTIME-INR
INR: 1.4 — ABNORMAL HIGH (ref 0.8–1.2)
Prothrombin Time: 17.1 s — ABNORMAL HIGH (ref 11.4–15.2)

## 2023-07-20 SURGERY — ABDOMINAL AORTIC ENDOVASCULAR FENESTRATED STENT GRAFT
Anesthesia: General | Site: Groin

## 2023-07-20 MED ORDER — DEXMEDETOMIDINE HCL IN NACL 80 MCG/20ML IV SOLN
INTRAVENOUS | Status: DC | PRN
  Administered 2023-07-20 (×2): 4 ug via INTRAVENOUS

## 2023-07-20 MED ORDER — METOPROLOL TARTRATE 5 MG/5ML IV SOLN
2.0000 mg | INTRAVENOUS | Status: DC | PRN
Start: 2023-07-20 — End: 2023-07-21

## 2023-07-20 MED ORDER — HEPARIN SODIUM (PORCINE) 1000 UNIT/ML IJ SOLN
INTRAMUSCULAR | Status: DC | PRN
  Administered 2023-07-20: 3000 [IU] via INTRAVENOUS
  Administered 2023-07-20: 10000 [IU] via INTRAVENOUS
  Administered 2023-07-20: 4000 [IU] via INTRAVENOUS

## 2023-07-20 MED ORDER — ACETAMINOPHEN 650 MG RE SUPP: 325.0000 mg | RECTAL | Status: DC | PRN

## 2023-07-20 MED ORDER — FERROUS SULFATE 325 (65 FE) MG PO TABS
325.0000 mg | ORAL_TABLET | Freq: Every day | ORAL | Status: DC
  Administered 2023-07-21: 325 mg via ORAL
  Filled 2023-07-20: qty 1

## 2023-07-20 MED ORDER — ACETAMINOPHEN 325 MG PO TABS: 325.0000 mg | ORAL_TABLET | ORAL | Status: DC | PRN

## 2023-07-20 MED ORDER — LIDOCAINE 2% (20 MG/ML) 5 ML SYRINGE
INTRAMUSCULAR | Status: DC | PRN
  Administered 2023-07-20: 60 mg via INTRAVENOUS

## 2023-07-20 MED ORDER — UMECLIDINIUM BROMIDE 62.5 MCG/ACT IN AEPB
1.0000 | INHALATION_SPRAY | Freq: Every day | RESPIRATORY_TRACT | Status: DC
Start: 2023-07-21 — End: 2023-07-21
  Filled 2023-07-20: qty 7

## 2023-07-20 MED ORDER — ROCURONIUM BROMIDE 10 MG/ML (PF) SYRINGE
PREFILLED_SYRINGE | INTRAVENOUS | Status: AC
  Filled 2023-07-20: qty 10

## 2023-07-20 MED ORDER — HEPARIN 6000 UNIT IRRIGATION SOLUTION
Status: DC | PRN
  Administered 2023-07-20: 1

## 2023-07-20 MED ORDER — ONDANSETRON HCL 4 MG/2ML IJ SOLN: 4.0000 mg | Freq: Four times a day (QID) | INTRAMUSCULAR | Status: DC | PRN

## 2023-07-20 MED ORDER — PROPOFOL 10 MG/ML IV BOLUS
INTRAVENOUS | Status: DC | PRN
  Administered 2023-07-20: 200 mg via INTRAVENOUS

## 2023-07-20 MED ORDER — GLYCOPYRROLATE PF 0.2 MG/ML IJ SOSY
PREFILLED_SYRINGE | INTRAMUSCULAR | Status: DC | PRN
  Administered 2023-07-20: .1 mg via INTRAVENOUS

## 2023-07-20 MED ORDER — HYDROCHLOROTHIAZIDE 12.5 MG PO TABS: 12.5000 mg | ORAL_TABLET | Freq: Every day | ORAL | Status: DC

## 2023-07-20 MED ORDER — ONDANSETRON HCL 4 MG/2ML IJ SOLN: 4.0000 mg | Freq: Once | INTRAMUSCULAR | Status: DC | PRN

## 2023-07-20 MED ORDER — PHENOL 1.4 % MT LIQD: 1.0000 | OROMUCOSAL | Status: DC | PRN

## 2023-07-20 MED ORDER — ONDANSETRON HCL 4 MG/2ML IJ SOLN
INTRAMUSCULAR | Status: AC
  Filled 2023-07-20: qty 2

## 2023-07-20 MED ORDER — MORPHINE SULFATE (PF) 2 MG/ML IV SOLN: 2.0000 mg | INTRAVENOUS | Status: DC | PRN

## 2023-07-20 MED ORDER — FENTANYL CITRATE (PF) 100 MCG/2ML IJ SOLN
25.0000 ug | INTRAMUSCULAR | Status: DC | PRN
Start: 2023-07-20 — End: 2023-07-20

## 2023-07-20 MED ORDER — FENTANYL CITRATE (PF) 250 MCG/5ML IJ SOLN
INTRAMUSCULAR | Status: AC
  Filled 2023-07-20: qty 5

## 2023-07-20 MED ORDER — ASPIRIN 81 MG PO TBEC
81.0000 mg | DELAYED_RELEASE_TABLET | Freq: Every day | ORAL | Status: DC
  Administered 2023-07-21: 81 mg via ORAL
  Filled 2023-07-20: qty 1

## 2023-07-20 MED ORDER — GUAIFENESIN-DM 100-10 MG/5ML PO SYRP: 15.0000 mL | ORAL_SOLUTION | ORAL | Status: DC | PRN

## 2023-07-20 MED ORDER — CHLORHEXIDINE GLUCONATE 0.12 % MT SOLN
15.0000 mL | Freq: Once | OROMUCOSAL | Status: AC
Start: 2023-07-20 — End: 2023-07-20
  Administered 2023-07-20: 15 mL via OROMUCOSAL
  Filled 2023-07-20: qty 15

## 2023-07-20 MED ORDER — CHLORHEXIDINE GLUCONATE CLOTH 2 % EX PADS: 6.0000 | MEDICATED_PAD | Freq: Once | CUTANEOUS | Status: DC

## 2023-07-20 MED ORDER — LACTATED RINGERS IV SOLN: INTRAVENOUS | Status: DC | PRN

## 2023-07-20 MED ORDER — FENTANYL CITRATE (PF) 250 MCG/5ML IJ SOLN
INTRAMUSCULAR | Status: DC | PRN
  Administered 2023-07-20 (×3): 50 ug via INTRAVENOUS
  Administered 2023-07-20: 100 ug via INTRAVENOUS

## 2023-07-20 MED ORDER — DEXAMETHASONE SODIUM PHOSPHATE 10 MG/ML IJ SOLN
INTRAMUSCULAR | Status: DC | PRN
  Administered 2023-07-20: 10 mg via INTRAVENOUS

## 2023-07-20 MED ORDER — PANTOPRAZOLE SODIUM 40 MG PO TBEC
40.0000 mg | DELAYED_RELEASE_TABLET | Freq: Every day | ORAL | Status: DC
  Administered 2023-07-20 – 2023-07-21 (×2): 40 mg via ORAL
  Filled 2023-07-20 (×2): qty 1

## 2023-07-20 MED ORDER — ATORVASTATIN CALCIUM 40 MG PO TABS
40.0000 mg | ORAL_TABLET | Freq: Every day | ORAL | Status: DC
Start: 2023-07-21 — End: 2023-07-21
  Administered 2023-07-21: 40 mg via ORAL
  Filled 2023-07-20: qty 1

## 2023-07-20 MED ORDER — ONDANSETRON HCL 4 MG/2ML IJ SOLN
INTRAMUSCULAR | Status: DC | PRN
  Administered 2023-07-20: 4 mg via INTRAVENOUS

## 2023-07-20 MED ORDER — BENAZEPRIL HCL 5 MG PO TABS: 10.0000 mg | ORAL_TABLET | Freq: Every day | ORAL | Status: DC

## 2023-07-20 MED ORDER — ROCURONIUM BROMIDE 10 MG/ML (PF) SYRINGE
PREFILLED_SYRINGE | INTRAVENOUS | Status: DC | PRN
  Administered 2023-07-20 (×4): 10 mg via INTRAVENOUS
  Administered 2023-07-20: 60 mg via INTRAVENOUS
  Administered 2023-07-20: 10 mg via INTRAVENOUS

## 2023-07-20 MED ORDER — HEPARIN 6000 UNIT IRRIGATION SOLUTION
Status: AC
  Filled 2023-07-20: qty 1000

## 2023-07-20 MED ORDER — BENAZEPRIL-HYDROCHLOROTHIAZIDE 20-25 MG PO TABS: 0.5000 | ORAL_TABLET | Freq: Every day | ORAL | Status: DC

## 2023-07-20 MED ORDER — DOCUSATE SODIUM 100 MG PO CAPS
100.0000 mg | ORAL_CAPSULE | Freq: Every day | ORAL | Status: DC
  Filled 2023-07-20: qty 1

## 2023-07-20 MED ORDER — ALBUTEROL SULFATE HFA 108 (90 BASE) MCG/ACT IN AERS: 2.0000 | INHALATION_SPRAY | Freq: Four times a day (QID) | RESPIRATORY_TRACT | Status: DC | PRN

## 2023-07-20 MED ORDER — LABETALOL HCL 5 MG/ML IV SOLN: 10.0000 mg | INTRAVENOUS | Status: DC | PRN

## 2023-07-20 MED ORDER — GLYCOPYRROLATE PF 0.2 MG/ML IJ SOSY
PREFILLED_SYRINGE | INTRAMUSCULAR | Status: AC
  Filled 2023-07-20: qty 1

## 2023-07-20 MED ORDER — LIDOCAINE 2% (20 MG/ML) 5 ML SYRINGE
INTRAMUSCULAR | Status: AC
  Filled 2023-07-20: qty 5

## 2023-07-20 MED ORDER — HYDRALAZINE HCL 20 MG/ML IJ SOLN: 5.0000 mg | INTRAMUSCULAR | Status: DC | PRN

## 2023-07-20 MED ORDER — FLUTICASONE FUROATE-VILANTEROL 100-25 MCG/ACT IN AEPB
1.0000 | INHALATION_SPRAY | Freq: Every day | RESPIRATORY_TRACT | Status: DC
  Filled 2023-07-20: qty 28

## 2023-07-20 MED ORDER — PHENYLEPHRINE 80 MCG/ML (10ML) SYRINGE FOR IV PUSH (FOR BLOOD PRESSURE SUPPORT)
PREFILLED_SYRINGE | INTRAVENOUS | Status: DC | PRN
  Administered 2023-07-20: 40 ug via INTRAVENOUS
  Administered 2023-07-20: 80 ug via INTRAVENOUS
  Administered 2023-07-20: 40 ug via INTRAVENOUS

## 2023-07-20 MED ORDER — ALBUTEROL SULFATE (2.5 MG/3ML) 0.083% IN NEBU: 2.5000 mg | INHALATION_SOLUTION | Freq: Four times a day (QID) | RESPIRATORY_TRACT | Status: DC | PRN

## 2023-07-20 MED ORDER — PROPOFOL 10 MG/ML IV BOLUS
INTRAVENOUS | Status: AC
Start: 2023-07-20 — End: ?
  Filled 2023-07-20: qty 20

## 2023-07-20 MED ORDER — ALUM & MAG HYDROXIDE-SIMETH 200-200-20 MG/5ML PO SUSP: 15.0000 mL | ORAL | Status: DC | PRN

## 2023-07-20 MED ORDER — SUGAMMADEX SODIUM 200 MG/2ML IV SOLN
INTRAVENOUS | Status: DC | PRN
  Administered 2023-07-20: 200 mg via INTRAVENOUS

## 2023-07-20 MED ORDER — ACETAMINOPHEN 500 MG PO TABS
1000.0000 mg | ORAL_TABLET | Freq: Once | ORAL | Status: AC
  Administered 2023-07-20: 1000 mg via ORAL
  Filled 2023-07-20: qty 2

## 2023-07-20 MED ORDER — SODIUM CHLORIDE 0.9 % IV SOLN: INTRAVENOUS | Status: DC

## 2023-07-20 MED ORDER — AMISULPRIDE (ANTIEMETIC) 5 MG/2ML IV SOLN: 10.0000 mg | Freq: Once | INTRAVENOUS | Status: DC | PRN

## 2023-07-20 MED ORDER — CEFAZOLIN SODIUM-DEXTROSE 2-4 GM/100ML-% IV SOLN
2.0000 g | INTRAVENOUS | Status: AC
  Administered 2023-07-20: 2 g via INTRAVENOUS
  Filled 2023-07-20: qty 100

## 2023-07-20 MED ORDER — POTASSIUM CHLORIDE CRYS ER 20 MEQ PO TBCR
20.0000 meq | EXTENDED_RELEASE_TABLET | Freq: Every day | ORAL | Status: DC | PRN
Start: 2023-07-20 — End: 2023-07-21

## 2023-07-20 MED ORDER — DEXAMETHASONE SODIUM PHOSPHATE 10 MG/ML IJ SOLN
INTRAMUSCULAR | Status: AC
  Filled 2023-07-20: qty 1

## 2023-07-20 MED ORDER — OXYCODONE HCL 5 MG PO TABS: 5.0000 mg | ORAL_TABLET | ORAL | Status: DC | PRN

## 2023-07-20 MED ORDER — CEFAZOLIN SODIUM-DEXTROSE 2-4 GM/100ML-% IV SOLN
2.0000 g | Freq: Three times a day (TID) | INTRAVENOUS | Status: AC
  Administered 2023-07-20 – 2023-07-21 (×2): 2 g via INTRAVENOUS
  Filled 2023-07-20 (×2): qty 100

## 2023-07-20 MED ORDER — LABETALOL HCL 5 MG/ML IV SOLN
INTRAVENOUS | Status: DC | PRN
  Administered 2023-07-20: 5 mg via INTRAVENOUS

## 2023-07-20 MED ORDER — PHENYLEPHRINE HCL-NACL 20-0.9 MG/250ML-% IV SOLN
INTRAVENOUS | Status: DC | PRN
  Administered 2023-07-20: 40 ug/min via INTRAVENOUS

## 2023-07-20 MED ORDER — SODIUM CHLORIDE 0.9 % IV SOLN
INTRAVENOUS | Status: DC
Start: 2023-07-20 — End: 2023-07-20

## 2023-07-20 MED ORDER — VALACYCLOVIR HCL 500 MG PO TABS
1000.0000 mg | ORAL_TABLET | Freq: Every day | ORAL | Status: DC
  Administered 2023-07-21: 1000 mg via ORAL
  Filled 2023-07-20: qty 2

## 2023-07-20 MED ORDER — MAGNESIUM SULFATE 2 GM/50ML IV SOLN: 2.0000 g | Freq: Every day | INTRAVENOUS | Status: DC | PRN

## 2023-07-20 MED ORDER — POLYETHYLENE GLYCOL 3350 17 G PO PACK: 17.0000 g | PACK | Freq: Every day | ORAL | Status: DC | PRN

## 2023-07-20 MED ORDER — ORAL CARE MOUTH RINSE: 15.0000 mL | Freq: Once | OROMUCOSAL | Status: AC

## 2023-07-20 MED ORDER — HEPARIN SODIUM (PORCINE) 5000 UNIT/ML IJ SOLN
5000.0000 [IU] | Freq: Three times a day (TID) | INTRAMUSCULAR | Status: DC
  Administered 2023-07-21: 5000 [IU] via SUBCUTANEOUS
  Filled 2023-07-20: qty 1

## 2023-07-20 MED ORDER — IODIXANOL 320 MG/ML IV SOLN
INTRAVENOUS | Status: DC | PRN
  Administered 2023-07-20: 80 mL via INTRA_ARTERIAL

## 2023-07-20 MED ORDER — SODIUM CHLORIDE 0.9 % IV SOLN
500.0000 mL | Freq: Once | INTRAVENOUS | Status: DC | PRN
Start: 2023-07-20 — End: 2023-07-21

## 2023-07-20 MED ORDER — PROTAMINE SULFATE 10 MG/ML IV SOLN
INTRAVENOUS | Status: DC | PRN
  Administered 2023-07-20: 10 mg via INTRAVENOUS
  Administered 2023-07-20: 40 mg via INTRAVENOUS

## 2023-07-20 SURGICAL SUPPLY — 58 items
BAG COUNTER SPONGE SURGICOUNT (BAG) ×2 IMPLANT
BAG DECANTER FOR FLEXI CONT (MISCELLANEOUS) IMPLANT
BALLN CODA OCL 2-9.0-35-120-3 (BALLOONS) ×2 IMPLANT
BALLN MUSTANG 9X20X75 (BALLOONS) ×2 IMPLANT
BALLOON COD OCL 2-9.0-35-120-3 (BALLOONS) IMPLANT
BALLOON MUSTANG 9X20X75 (BALLOONS) IMPLANT
CANISTER SUCT 3000ML PPV (MISCELLANEOUS) ×2 IMPLANT
CATH ACCU-VU SIZ PIG 5F 100CM (CATHETERS) IMPLANT
CATH BEACON 5 .035 65 KMP TIP (CATHETERS) IMPLANT
CATH OMNI FLUSH .035X70CM (CATHETERS) IMPLANT
CATH QUICKCROSS SUPP .035X90CM (MICROCATHETER) IMPLANT
DERMABOND ADVANCED .7 DNX12 (GAUZE/BANDAGES/DRESSINGS) ×2 IMPLANT
DEVICE CLOSURE PERCLS PRGLD 6F (VASCULAR PRODUCTS) IMPLANT
DEVICE TORQUE H2O (MISCELLANEOUS) IMPLANT
DRSG TEGADERM 2-3/8X2-3/4 SM (GAUZE/BANDAGES/DRESSINGS) ×4 IMPLANT
ELECT REM PT RETURN 9FT ADLT (ELECTROSURGICAL) ×4 IMPLANT
ELECTRODE REM PT RTRN 9FT ADLT (ELECTROSURGICAL) ×4 IMPLANT
GAUZE SPONGE 2X2 8PLY STRL LF (GAUZE/BANDAGES/DRESSINGS) ×4 IMPLANT
GLIDEWIRE ADV .035X260CM (WIRE) IMPLANT
GLIDEWIRE ANGLED NITR .018X260 (WIRE) IMPLANT
GLOVE BIO SURGEON STRL SZ7.5 (GLOVE) ×2 IMPLANT
GOWN STRL REUS W/ TWL LRG LVL3 (GOWN DISPOSABLE) ×4 IMPLANT
GOWN STRL REUS W/ TWL XL LVL3 (GOWN DISPOSABLE) ×4 IMPLANT
GRAFT FEN DIST EVAR 12X28X76 (Graft) IMPLANT
GRAFT FEN PROX EVAR 24X124 (Graft) IMPLANT
GRAFT LEG ILIAC ZSLE-13-74-ZT (Endovascular Graft) IMPLANT
GRAFT LEG ILIAC ZSLE-24-56-ZT (Endovascular Graft) IMPLANT
KIT BASIN OR (CUSTOM PROCEDURE TRAY) ×2 IMPLANT
KIT ENCORE 26 ADVANTAGE (KITS) ×2 IMPLANT
KIT TURNOVER KIT B (KITS) ×2 IMPLANT
NS IRRIG 1000ML POUR BTL (IV SOLUTION) ×2 IMPLANT
PACK ENDOVASCULAR (PACKS) ×2 IMPLANT
PAD ARMBOARD 7.5X6 YLW CONV (MISCELLANEOUS) ×4 IMPLANT
PERCLOSE PROGLIDE 6F (VASCULAR PRODUCTS) ×8 IMPLANT
POWDER SURGICEL 3.0 GRAM (HEMOSTASIS) IMPLANT
SET MICROPUNCTURE 5F STIFF (MISCELLANEOUS) ×2 IMPLANT
SHEATH APTUS 7.0FR 180D 55 (SHEATH) IMPLANT
SHEATH BRITE TIP 8FR 35CM (SHEATH) ×2 IMPLANT
SHEATH DRYSEAL FLEX 16FR 33CM (SHEATH) IMPLANT
SHEATH DRYSEAL FLEX 18FR 33CM (SHEATH) IMPLANT
SHEATH GUIDING 7F 55X73X9MM TD (SHEATH) IMPLANT
SHEATH HIGHFLEX ANSEL 6FRX55 (SHEATH) IMPLANT
SHEATH HIGHFLEX ANSEL 7FR 55CM (SHEATH) ×2 IMPLANT
SHEATH PINNACLE 8F 10CM (SHEATH) ×2 IMPLANT
STENT VIABAHN 6X29 6FR 80 (Permanent Stent) IMPLANT
STOPCOCK MORSE 400PSI 3WAY (MISCELLANEOUS) ×2 IMPLANT
SUT ETHIBOND 4 0 TF (SUTURE) IMPLANT
SUT MNCRL AB 4-0 PS2 18 (SUTURE) ×4 IMPLANT
SUT PROLENE 5 0 RB 1 DA (SUTURE) IMPLANT
SUT VIC AB 2-0 CT1 TAPERPNT 27 (SUTURE) IMPLANT
SUT VIC AB 3-0 SH 27X BRD (SUTURE) IMPLANT
SYR 30ML LL (SYRINGE) IMPLANT
TOWEL GREEN STERILE (TOWEL DISPOSABLE) ×2 IMPLANT
TRAY FOLEY MTR SLVR 16FR STAT (SET/KITS/TRAYS/PACK) ×2 IMPLANT
TUBING HIGH PRESSURE 120CM (CONNECTOR) ×2 IMPLANT
WIRE BENTSON .035X145CM (WIRE) ×4 IMPLANT
WIRE ROSEN-J .035X260CM (WIRE) ×4 IMPLANT
WIRE STIFF LUNDERQUIST 260MM (WIRE) ×4 IMPLANT

## 2023-07-20 NOTE — Anesthesia Procedure Notes (Signed)
Arterial Line Insertion Start/End12/17/2024 7:05 AM, 07/20/2023 7:12 AM Performed by: Leonides Grills, MD, Sharyn Dross, CRNA, CRNA  Patient location: Pre-op. Preanesthetic checklist: patient identified, IV checked, site marked, risks and benefits discussed, surgical consent, monitors and equipment checked, pre-op evaluation, timeout performed and anesthesia consent Lidocaine 1% used for infiltration Right, radial was placed Catheter size: 20 G Hand hygiene performed , maximum sterile barriers used  and Seldinger technique used Allen's test indicative of satisfactory collateral circulation Attempts: 2 Procedure performed without using ultrasound guided technique. Following insertion, dressing applied. Post procedure assessment: normal and unchanged  Patient tolerated the procedure well with no immediate complications. Additional procedure comments: A-line performed by SRNA with CRNA assistance.Marland Kitchen

## 2023-07-20 NOTE — Discharge Instructions (Signed)
  [facility]   Discharge Instructions  Endovascular Aortic Aneurysm Repair  Please refer to the following instructions for your post-procedure care. Your surgeon or Physician Assistant will discuss any changes with you.  Activity  You are encouraged to walk as much as you can. You can slowly return to normal activities but must avoid strenuous activity and heavy lifting until your doctor tells you it's OK. Avoid activities such as vacuuming or swinging a gold club. It is normal to feel tired for several weeks after your surgery. Do not drive until your doctor gives the OK and you are no longer taking prescription pain medications. It is also normal to have difficulty with sleep habits, eating, and bowel movements after surgery. These will go away with time.  Bathing/Showering  Shower daily after you go home.  Do not soak in a bathtub, hot tub, or swim until the incision heals completely.  If you have incisions in your groin, wash the groin wounds with soap and water daily and pat dry. (No tub bath-only shower)  Then put a dry gauze or washcloth there to keep this area dry to help prevent wound infection daily and as needed.  Do not use Vaseline or neosporin on your incisions.  Only use soap and water on your incisions and then protect and keep dry.  Incision Care  Shower every day. Clean your incision with mild soap and water. Pat the area dry with a clean towel. You do not need a bandage unless otherwise instructed. Do not apply any ointments or creams to your incision. If you clothing is irritating, you may cover your incision with a dry gauze pad.  Diet  Resume your normal diet. There are no special food restrictions following this procedure. A low fat/low cholesterol diet is recommended for all patients with vascular disease. In order to heal from your surgery, it is CRITICAL to get adequate nutrition. Your body requires vitamins, minerals, and protein.  Vegetables are the best source of vitamins and minerals. Vegetables also provide the perfect balance of protein. Processed food has little nutritional value, so try to avoid this.  Medications  Resume taking all of your medications unless your doctor or nurse practitioner tells you not to. If your incision is causing pain, you may take over-the-counter pain relievers such as acetaminophen (Tylenol). If you were prescribed a stronger pain medication, please be aware these medications can cause nausea and constipation. Prevent nausea by taking the medication with a snack or meal. Avoid constipation by drinking plenty of fluids and eating foods with a high amount of fiber, such as fruits, vegetables, and grains.  Do not take Tylenol if you are taking prescription pain medications.   Follow up  Our office will schedule a follow-up appointment with a CT scan 3-4 weeks after your surgery.  Please call us immediately for any of the following conditions  Severe or worsening pain in your legs or feet or in your abdomen back or chest. Increased pain, redness, drainage (pus) from your incision site. Increased abdominal pain, bloating, nausea, vomiting or persistent diarrhea. Fever of 101 degrees or higher. Swelling in your leg (s),  Reduce your risk of vascular disease  Stop smoking. If you would like help call [facility] at 1-800-QUIT-NOW (1-800-784-8669) or [place] at 336-586-4000. Manage your cholesterol Maintain a desired weight Control your diabetes Keep your blood pressure down  If you have questions, please call the office at [phone].  

## 2023-07-20 NOTE — H&P (Signed)
HPI:   Kyle [name] is a 77 y.o. male recently admitted with pulmonary embolus found to have enlarging abdominal aortic aneurysm.  Patient is actually done quite well following his anticoagulated.  He has no new back or abdominal pain.  He remains on Eliquis and has recently been evaluated by hematology.  He is here today to discuss surgical options.  Eliquis held since 12/14.       Past Medical History:  Diagnosis Date   Anemia     CAD (coronary artery disease) 08/03/2009   Cardiac arrhythmia     COPD (chronic obstructive pulmonary disease) (HCC)     GERD (gastroesophageal reflux disease)     Hyperlipidemia     Hypertension     Hypothyroidism     Myocardial infarction (HCC) 08/03/2009   Sleep apnea treated with nocturnal BiPAP               Family History  Problem Relation Age of Onset   Stroke Mother     Hypertension Mother     Stroke Father     Hypotension Father     Multiple sclerosis Sister     Thyroid disease Brother               Past Surgical History:  Procedure Laterality Date   APPENDECTOMY       CARDIAC CATHETERIZATION N/A 05/05/2016    Procedure: Left Heart Cath and Coronary Angiography;  Surgeon: Jake Bathe, MD;  [facility];  Service: Cardiovascular;  Laterality: N/A;   CORONARY ANGIOPLASTY WITH STENT PLACEMENT       EYE SURGERY        lasix /BIL   TONSILLECTOMY              Short Social History:  Social History         Tobacco Use   Smoking status: Former      Current packs/day: 0.00      Average packs/day: 4.0 packs/day for 28.0 years (112.0 ttl pk-yrs)      Types: Cigarettes      Start date: 12/10/1965      Quit date: 12/10/1993      Years since quitting: 29.4   Smokeless tobacco: Never  Substance Use Topics   Alcohol use: Yes      Alcohol/week: 5.0 standard drinks of alcohol      Types: 5 Standard drinks or equivalent per week      Comment: once every 2 months      Allergies  No Known Allergies            Current Outpatient Medications  Medication Sig Dispense Refill   albuterol (VENTOLIN HFA) 108 (90 Base) MCG/ACT inhaler Inhale 2 puffs into the lungs every 6 (six) hours as needed for wheezing or shortness of breath. 6.7 g 11   apixaban (ELIQUIS) 5 MG TABS tablet Take 1 tablet (5 mg total) by mouth 2 (two) times daily. 60 tablet 3   atorvastatin (LIPITOR) 40 MG tablet Take 1 tablet (40 mg total) by mouth every evening at 6 PM. 90 tablet 3   benazepril-hydrochlorthiazide (LOTENSIN HCT) 20-25 MG tablet Take 1/2 (one half) tablet by mouth daily. 45 tablet 3   cetirizine (ZYRTEC) 10 MG tablet Take 10 mg by mouth daily.       Cyanocobalamin (B-12 PO) Take 1 tablet by mouth daily.       FEROSUL 325 (65 Fe) MG tablet TAKE ONE TABLET BY  MOUTH ONE TIME DAILY WITH BREAKFAST (Patient taking differently: Take 325 mg by mouth daily with breakfast.) 90 tablet 3   Fluticasone-Umeclidin-Vilant (TRELEGY ELLIPTA) 100-62.5-25 MCG/ACT AEPB Inhale 1 puff into the lungs daily. 60 each 11   levothyroxine (SYNTHROID) 25 MCG tablet Take 1 tablet (25 mcg total) by mouth daily at 6 (six) AM. 30 tablet 1   Multiple Vitamin (MULTIVITAMIN) tablet Take 1 tablet by mouth daily.       valACYclovir (VALTREX) 1000 MG tablet Take 1 g by mouth daily.          No current facility-administered medications for this visit.        Review of Systems  Constitutional:  Constitutional negative. HENT: HENT negative.  Eyes: Eyes negative.  Respiratory: Positive for shortness of breath.  GI: Gastrointestinal negative.  Musculoskeletal: Musculoskeletal negative.  Neurological: Neurological negative. Hematologic: Positive for bruises/bleeds easily.  Psychiatric: Psychiatric negative.          Objective:    Vitals:   07/20/23 0545  BP: (!) 134/57  Pulse: 81  Resp: 16  Temp: 98.2 F (36.8 C)  SpO2: 94%      Physical Exam HENT:     Head: Normocephalic.     Nose: Nose normal.  Eyes:     Pupils: Pupils are equal,  round, and reactive to light.  Cardiovascular:     Rate and Rhythm: Normal rate.  Abdominal:     General: Abdomen is flat.  Musculoskeletal:        General: Normal range of motion.     Cervical back: Normal range of motion and neck supple.     Right lower leg: No edema.     Left lower leg: No edema.  Skin:    General: Skin is warm.     Capillary Refill: Capillary refill takes less than 2 seconds.  Neurological:     General: No focal deficit present.     Mental Status: He is alert.  Psychiatric:        Mood and Affect: Mood normal.          Assessment/Plan:    77 year old male recently admitted for pulmonary embolus with large AAA. He has been on eliquis for 3 months and now held since 12/14. Plan for Zenith fenestrated AAA repair today in the OR.    Erina Hamme C. Randie Heinz, MD [facility] [place] Office: [phone] Pager: [phone]

## 2023-07-20 NOTE — Anesthesia Procedure Notes (Signed)
Procedure Name: Intubation Date/Time: 07/20/2023 7:41 AM  Performed by: Sharyn Dross, CRNAPre-anesthesia Checklist: Patient identified, Emergency Drugs available, Suction available and Patient being monitored Patient Re-evaluated:Patient Re-evaluated prior to induction Oxygen Delivery Method: Circle system utilized Preoxygenation: Pre-oxygenation with 100% oxygen Induction Type: IV induction Ventilation: Mask ventilation without difficulty Laryngoscope Size: Miller and 2 Grade View: Grade I Tube type: Oral Tube size: 7.5 mm Number of attempts: 1 Airway Equipment and Method: Stylet and Oral airway Placement Confirmation: ETT inserted through vocal cords under direct vision, positive ETCO2 and breath sounds checked- equal and bilateral Secured at: 21 cm Tube secured with: Tape Dental Injury: Teeth and Oropharynx as per pre-operative assessment  Comments: Performed Carroll Sage, SRNA

## 2023-07-20 NOTE — Plan of Care (Addendum)
  Problem: Skin Integrity: Goal: Demonstration of wound healing without infection will improve Outcome: Progressing   Problem: Clinical Measurements: Goal: Postoperative complications will be avoided or minimized Outcome: Progressing   Problem: Respiratory: Goal: Ability to achieve and maintain a regular respiratory rate will improve Outcome: Progressing   Problem: Education: Goal: Knowledge of General Education information will improve Description: Including pain rating scale, medication(s)/side effects and non-pharmacologic comfort measures Outcome: Progressing   Problem: Clinical Measurements: Goal: Ability to maintain clinical measurements within normal limits will improve Outcome: Progressing   Problem: Health Behavior/Discharge Planning: Goal: Ability to manage health-related needs will improve Outcome: Progressing   Problem: Clinical Measurements: Goal: Respiratory complications will improve Outcome: Progressing   Problem: Clinical Measurements: Goal: Diagnostic test results will improve Outcome: Progressing   Problem: Clinical Measurements: Goal: Cardiovascular complication will be avoided Outcome: Progressing   Problem: Nutrition: Goal: Adequate nutrition will be maintained Outcome: Progressing

## 2023-07-20 NOTE — Progress Notes (Signed)
   07/20/23 1912  BiPAP/CPAP/SIPAP  BiPAP/CPAP/SIPAP Pt Type Adult  BiPAP/CPAP/SIPAP Resmed  BiPAP/CPAP /SiPAP Vitals  SpO2 95 %   Pt has home CPAP machine,no assistance needed

## 2023-07-20 NOTE — Op Note (Signed)
Patient name: MARLANDO MIYATA MRN: [id number] DOB: [birth year] Sex: male  07/20/2023 Pre-operative Diagnosis: Abdominal aortic aneurysm Post-operative diagnosis:  Same Surgeon:  Apolinar Junes C. Randie Heinz, MD Assistant: Loel Dubonnet, PA Procedure Performed: 1.  Percutaneous ultrasound-guided access of bilateral common femoral arteries with Pro-glide closure 2.  Zenith fenestrated endovascular aneurysm repair with proximal main body 24 x 124 with SMA scallop and small fenestrations right 9:00 and left 345 extended with distal 12 x 28 x 76 with right common iliac 24 x 56 and left common iliac 13 x 74.  Stenting of the bilateral renal arteries with 6 x 29 mm VBX flared with 9 x 20mm balloon  Indications: 77 year old male with known abdominal aortic aneurysm that now meets criteria for repair.  He recently had pulmonary embolus and has now completed 3 months of Eliquis and will need additional 3 months post procedure.  He has been cleared by cardiology and we have constructed Zenith fenestrated graft in preparation for repair of abdominal aortic aneurysm with bilateral renal artery stenting.  Experience assistant was necessary to facilitate percutaneous access and closure of the bilateral common femoral arteries as well as passing sheath, wires and catheters.  Findings: The right lowest renal and left highest renal were easily identified and the graft was initially deployed both renal arteries were easily cannulated and VBX stents were placed and flared to 9 mm.  After initially placing the proximal main body Zenith fenestrated graft we extended with main body Cook graft and on the right used a 24 mm graft in the common iliac artery and the left used a 13 mm graft.  At completion the SMA filled briskly as did the bilateral renal arteries and there were no endoleak's with adequate seal distally at the common iliac arteries.  Bilateral dorsalis pedis pulses were palpable at completion.   Procedure:  The patient  was identified in the holding area and taken to the operating room where his play supine operative when general anesthesia was induced.  He was gently prepped and draped in the abdomen bilateral groins in usual fashion, antibiotics were administered timeout was called.  Ultrasound was initially used to evaluate the right common femoral artery and this was percutaneously accessed with micropuncture needle followed by wire and a sheath.  We placed a Bentson wire followed by an 8 [place] dilator and then deployed 2 percutaneous closure devices and placed an 8 French sheath under fluoroscopic guidance.  Similarly on the left side cannulated the left common femoral artery where it was healthy appearing with micropuncture needle followed by wire and sheath followed by a Bentson wire 8 French dilator and 2 Pro-glide devices at 10 and 2:00.  A similar 8 French sheath was placed.  We then placed stiff wires into the aorta.  On the left side we placed a 16 French sheath patient was fully heparinized.  On the right side we initially oriented the main body graft outside of the body and then remove the 8 French sheath and placed this to the level of just below L1.  From the left we placed an Omni catheter and then oriented the image intensifier with LAO cephalad angulation.  Angiography was performed and the graft was 80% deployed.  We initially cannulated the right renal artery with steerable sheath and then placed a quick cross catheter and confirmed intraluminal access and placed a Rosen wire.  On the left side we then cannulated the left fenestration into the left renal artery we used the  stenosis placed a Glidewire advantage and perform angiography from the sheath which demonstrated access.  6 x 29 mm VBX's were placed in both fenestrations.  The graft was then fully deployed and ballooned at the top.  The VBX's were then deployed in postdilated with 9 mm balloon.  Through the right side we then exchanged to the main body  graft which was 12 x 28 x 76 and this was opened with the gait anterior and left lateral.  The gate was then cannulated from the left side after the renal sheaths were removed.  This was done with Kumpe catheter and Glidewire advantage.  We then placed a Etta Grandchild balloon into the left limb and inflated this at the top which demonstrated a mushroom type appearance to confirm access into the left limb.  We then extended on the left with 13 x 74 mm graft.  On the right side we fully deployed the graft and then extended into the right common iliac artery with 24 x 56 mm limb.  The entire distal main body was ironed out with a Coda balloon.  Completion angiography demonstrated brisk filling of the SMA proximally as well as the bilateral renal artery stents and into both limbs and hypogastric arteries without any evidence of endoleak.  Satisfied with this we then placed Bentson wires in both groins and deployed our Pro-glide devices.  There were strong signals at the dorsalis pedis bilaterally and patient's heparinization was reversed with 50 mg of protamine.  At completion there were palpable dorsalis pedis pulses.  The patient was awakened from anesthesia having tolerated the procedure well without immediate complication.  All counts were correct at completion.  EBL: 250cc  Contrast: 80cc   Viktorya Arguijo C. Randie Heinz, MD [facility] [place] Office: [phone] Pager: [phone]

## 2023-07-20 NOTE — Progress Notes (Addendum)
Patient received from PACU, he was asleep and responding to voice, vital signs taken, CCMD notified, A line zerod, vascular access sites assessed, no signs of bleeding and hematoma, patient is lying on supine position, call bell in reach, orientation given about the unit to the patient's spouse.

## 2023-07-20 NOTE — Transfer of Care (Signed)
Immediate Anesthesia Transfer of Care Note  Patient: Kyle [name]  Procedure(s) Performed: ABDOMINAL AORTIC ENDOVASCULAR FENESTRATED STENT GRAFT ULTRASOUND GUIDANCE FOR VASCULAR ACCESS, BILATERAL FEMORAL ARTERIES (Bilateral: Groin)  Patient Location: PACU  Anesthesia Type:General  Level of Consciousness: awake and patient cooperative  Airway & Oxygen Therapy: Patient Spontanous Breathing and Patient connected to face mask oxygen  Post-op Assessment: Report given to RN and Post -op Vital signs reviewed and stable  Post vital signs: Reviewed and stable  Last Vitals:  Vitals Value Taken Time  BP 135/50 07/20/23 1054  Temp    Pulse 66 07/20/23 1057  Resp 17 07/20/23 1057  SpO2 99 % 07/20/23 1057  Vitals shown include unfiled device data.  Last Pain:  Vitals:   07/20/23 0611  TempSrc:   PainSc: 0-No pain         Complications: No notable events documented.

## 2023-07-21 ENCOUNTER — Other Ambulatory Visit (HOSPITAL_COMMUNITY): Payer: Self-pay

## 2023-07-21 ENCOUNTER — Encounter (HOSPITAL_COMMUNITY): Payer: Self-pay | Admitting: Vascular Surgery

## 2023-07-21 LAB — POCT I-STAT 7, (LYTES, BLD GAS, ICA,H+H)
Acid-Base Excess: 0 mmol/L (ref 0.0–2.0)
Bicarbonate: 26.2 mmol/L (ref 20.0–28.0)
Calcium, Ion: 1.29 mmol/L (ref 1.15–1.40)
HCT: 32 % — ABNORMAL LOW (ref 39.0–52.0)
Hemoglobin: 10.9 g/dL — ABNORMAL LOW (ref 13.0–17.0)
O2 Saturation: 100 %
Patient temperature: 36.6
Potassium: 4.2 mmol/L (ref 3.5–5.1)
Sodium: 141 mmol/L (ref 135–145)
TCO2: 28 mmol/L (ref 22–32)
pCO2 arterial: 46.6 mm[Hg] (ref 32–48)
pH, Arterial: 7.356 (ref 7.35–7.45)
pO2, Arterial: 195 mm[Hg] — ABNORMAL HIGH (ref 83–108)

## 2023-07-21 LAB — BASIC METABOLIC PANEL
Anion gap: 10 (ref 5–15)
BUN: 14 mg/dL (ref 8–23)
CO2: 22 mmol/L (ref 22–32)
Calcium: 9 mg/dL (ref 8.9–10.3)
Chloride: 105 mmol/L (ref 98–111)
Creatinine, Ser: 0.9 mg/dL (ref 0.61–1.24)
GFR, Estimated: >60 mL/min (ref >60)
Glucose, Bld: 146 mg/dL — ABNORMAL HIGH (ref 70–99)
Potassium: 3.9 mmol/L (ref 3.5–5.1)
Sodium: 137 mmol/L (ref 135–145)

## 2023-07-21 LAB — CBC
HCT: 33.7 % — ABNORMAL LOW (ref 39.0–52.0)
Hemoglobin: 11.6 g/dL — ABNORMAL LOW (ref 13.0–17.0)
MCH: 32.9 pg (ref 26.0–34.0)
MCHC: 34.4 g/dL (ref 30.0–36.0)
MCV: 95.5 fL (ref 80.0–100.0)
Platelets: 112 10*3/uL — ABNORMAL LOW (ref 150–400)
RBC: 3.53 MIL/uL — ABNORMAL LOW (ref 4.22–5.81)
RDW: 12.4 % (ref 11.5–15.5)
WBC: 12 10*3/uL — ABNORMAL HIGH (ref 4.0–10.5)
nRBC: 0 % (ref 0.0–0.2)

## 2023-07-21 LAB — POCT ACTIVATED CLOTTING TIME
Activated Clotting Time: 210 s
Activated Clotting Time: 245 s
Activated Clotting Time: 256 s
Activated Clotting Time: 262 s

## 2023-07-21 MED ORDER — ASPIRIN 81 MG PO TBEC
81.0000 mg | DELAYED_RELEASE_TABLET | Freq: Every day | ORAL | 12 refills | Status: AC
  Filled 2023-07-21: qty 30, 30d supply, fill #0

## 2023-07-21 NOTE — Anesthesia Postprocedure Evaluation (Signed)
Anesthesia Post Note  Patient: Kyle [name]  Procedure(s) Performed: ABDOMINAL AORTIC ENDOVASCULAR FENESTRATED STENT GRAFT ULTRASOUND GUIDANCE FOR VASCULAR ACCESS, BILATERAL FEMORAL ARTERIES (Bilateral: Groin)     Patient location during evaluation: PACU Anesthesia Type: General Level of consciousness: awake Pain management: pain level controlled Vital Signs Assessment: post-procedure vital signs reviewed and stable Respiratory status: spontaneous breathing, nonlabored ventilation and respiratory function stable Cardiovascular status: blood pressure returned to baseline and stable Postop Assessment: no apparent nausea or vomiting Anesthetic complications: no   There were no known notable events for this encounter.  Last Vitals:  Vitals:   07/20/23 1912 07/21/23 0300  BP:  (!) 147/66  Pulse:  81  Resp:  20  Temp:  36.7 C  SpO2: 95% 94%    Last Pain:  Vitals:   07/21/23 0300  TempSrc: Oral  PainSc:                  Kyle [name] P Syrena Burges

## 2023-07-21 NOTE — Plan of Care (Signed)
  Problem: Education: Goal: Knowledge of discharge needs will improve Outcome: Progressing   Problem: Clinical Measurements: Goal: Postoperative complications will be avoided or minimized Outcome: Progressing   Problem: Respiratory: Goal: Ability to achieve and maintain a regular respiratory rate will improve Outcome: Progressing   Problem: Skin Integrity: Goal: Demonstration of wound healing without infection will improve Outcome: Progressing   Problem: Education: Goal: Knowledge of General Education information will improve Description: Including pain rating scale, medication(s)/side effects and non-pharmacologic comfort measures Outcome: Progressing   Problem: Health Behavior/Discharge Planning: Goal: Ability to manage health-related needs will improve Outcome: Progressing   Problem: Clinical Measurements: Goal: Will remain free from infection Outcome: Progressing Goal: Cardiovascular complication will be avoided Outcome: Progressing   Problem: Nutrition: Goal: Adequate nutrition will be maintained Outcome: Progressing   Problem: Safety: Goal: Ability to remain free from injury will improve Outcome: Progressing   Problem: Skin Integrity: Goal: Risk for impaired skin integrity will decrease Outcome: Progressing

## 2023-07-21 NOTE — TOC Transition Note (Signed)
Transition of Care (TOC) - Discharge Note Donn Pierini RN, BSN Transitions of Care Unit 4E- RN Case Manager See Treatment Team for direct phone #   Patient Details  Name: Kyle [name] MRN: [id number] Date of Birth: 1946/03/04  Transition of [facility]) CM/SW Contact:  Darrold Span, RN Phone Number: 07/21/2023, 12:03 PM   Clinical Narrative:    Pt stable for transition home today s/p EVAR, from home w/ wife. Wife to transport home.   CM notified by [facility] liaison that they received [facility] office referral for [facility] protocol. Liaison to follow up with pt post discharge.   No further TOC needs noted. Pt to get meds from [facility] pharmacy.     Final next level of care: Home w Home Health Services Barriers to Discharge: No Barriers Identified   Patient Goals and CMS Choice Patient states their goals for this hospitalization and ongoing recovery are:: return home   Choice offered to / list presented to :  ([facility] office HH referral)      Discharge Placement               Home w/ [facility]        Discharge Plan and Services Additional resources added to the After Visit Summary for       Post Acute Care Choice: Home Health                      [facility] Agency: [facility] Date [facility] Agency Contacted: 07/21/23 Time HH Agency Contacted: 1000 Representative spoke with at [facility] Agency: [place]  Social Drivers of Health (SDOH) Interventions SDOH Screenings   Food Insecurity: No Food Insecurity (07/20/2023)  Housing: Low Risk  (05/10/2023)  Transportation Needs: No Transportation Needs (05/10/2023)  Utilities: Not At Risk (05/10/2023)  Alcohol Screen: Low Risk  (10/13/2021)  Depression (PHQ2-9): Low Risk  (05/10/2023)  Financial Resource Strain: Low Risk  (04/09/2023)  Physical Activity: Unknown (04/09/2023)  Social Connections: Unknown (04/09/2023)  Stress: No Stress Concern Present (04/09/2023)  Tobacco Use: Medium Risk (07/20/2023)     Readmission Risk  Interventions    07/21/2023   12:03 PM  Readmission Risk Prevention Plan  Post Dischage Appt Complete  Medication Screening Complete  Transportation Screening Complete

## 2023-07-21 NOTE — Plan of Care (Signed)
  Problem: Education: Goal: Knowledge of discharge needs will improve Outcome: Adequate for Discharge   Problem: Clinical Measurements: Goal: Postoperative complications will be avoided or minimized Outcome: Adequate for Discharge   Problem: Respiratory: Goal: Ability to achieve and maintain a regular respiratory rate will improve Outcome: Adequate for Discharge   Problem: Skin Integrity: Goal: Demonstration of wound healing without infection will improve Outcome: Adequate for Discharge   Problem: Education: Goal: Knowledge of General Education information will improve Description: Including pain rating scale, medication(s)/side effects and non-pharmacologic comfort measures Outcome: Adequate for Discharge   Problem: Health Behavior/Discharge Planning: Goal: Ability to manage health-related needs will improve Outcome: Adequate for Discharge   Problem: Clinical Measurements: Goal: Ability to maintain clinical measurements within normal limits will improve Outcome: Adequate for Discharge Goal: Will remain free from infection Outcome: Adequate for Discharge Goal: Diagnostic test results will improve Outcome: Adequate for Discharge Goal: Respiratory complications will improve Outcome: Adequate for Discharge Goal: Cardiovascular complication will be avoided Outcome: Adequate for Discharge   Problem: Activity: Goal: Risk for activity intolerance will decrease Outcome: Adequate for Discharge   Problem: Nutrition: Goal: Adequate nutrition will be maintained Outcome: Adequate for Discharge   Problem: Coping: Goal: Level of anxiety will decrease Outcome: Adequate for Discharge   Problem: Elimination: Goal: Will not experience complications related to bowel motility Outcome: Adequate for Discharge Goal: Will not experience complications related to urinary retention Outcome: Adequate for Discharge   Problem: Pain Management: Goal: General experience of comfort will  improve Outcome: Adequate for Discharge   Problem: Safety: Goal: Ability to remain free from injury will improve Outcome: Adequate for Discharge   Problem: Skin Integrity: Goal: Risk for impaired skin integrity will decrease Outcome: Adequate for Discharge

## 2023-07-21 NOTE — Care Management Important Message (Signed)
Important Message  Patient Details  Name: Kyle [name] MRN: [id number] Date of Birth: [birth year]   Important Message Given:  Yes - Medicare IM     Renie Ora 07/21/2023, 8:25 AM

## 2023-07-21 NOTE — Progress Notes (Addendum)
Progress Note    07/21/2023 7:55 AM 1 Day Post-Op  Subjective:  feels great, denies any pain. He has walked up and down the hallway without issue    Vitals:   07/21/23 0300 07/21/23 0733  BP: (!) 147/66 (!) 151/67  Pulse: 81 67  Resp: 20 (!) 22  Temp: 98 F (36.7 C) 97.8 F (36.6 C)  SpO2: 94% 96%    Physical Exam: General:  sitting up in the chair, alert and oriented x4 Cardiac:  regular Lungs:  nonlabored Incisions:  bilateral groin cath sites soft without hematoma or bruising Extremities:  bilateral DP pulses 2+  CBC    Component Value Date/Time   WBC 12.0 (H) 07/21/2023 0420   RBC 3.53 (L) 07/21/2023 0420   HGB 11.6 (L) 07/21/2023 0420   HGB 13.3 05/10/2023 0950   HGB 14.5 09/07/2022 0826   HCT 33.7 (L) 07/21/2023 0420   HCT 41.9 09/07/2022 0826   PLT 112 (L) 07/21/2023 0420   PLT 167 05/10/2023 0950   PLT 154 09/07/2022 0826   MCV 95.5 07/21/2023 0420   MCV 98 (H) 09/07/2022 0826   MCH 32.9 07/21/2023 0420   MCHC 34.4 07/21/2023 0420   RDW 12.4 07/21/2023 0420   RDW 12.3 09/07/2022 0826   LYMPHSABS 1.2 05/10/2023 0950   LYMPHSABS 1.3 09/07/2022 0826   MONOABS 0.6 05/10/2023 0950   EOSABS 0.2 05/10/2023 0950   EOSABS 0.3 09/07/2022 0826   BASOSABS 0.1 05/10/2023 0950   BASOSABS 0.1 09/07/2022 0826    BMET    Component Value Date/Time   NA 137 07/21/2023 0420   NA 141 09/07/2022 0826   K 3.9 07/21/2023 0420   CL 105 07/21/2023 0420   CO2 22 07/21/2023 0420   GLUCOSE 146 (H) 07/21/2023 0420   BUN 14 07/21/2023 0420   BUN 16 09/07/2022 0826   CREATININE 0.90 07/21/2023 0420   CREATININE 0.89 05/10/2023 0950   CREATININE 1.00 04/27/2016 1020   CALCIUM 9.0 07/21/2023 0420   GFRNONAA >60 07/21/2023 0420   GFRNONAA >60 05/10/2023 0950   GFRAA  01/16/2010 0420    >60        The eGFR has been calculated using the MDRD equation. This calculation has not been validated in all clinical situations. eGFR's persistently <60 mL/min  signify possible Chronic Kidney Disease.    INR    Component Value Date/Time   INR 1.4 (H) 07/20/2023 1053     Intake/Output Summary (Last 24 hours) at 07/21/2023 0755 Last data filed at 07/21/2023 7062 Gross per 24 hour  Intake 2255.57 ml  Output 2500 ml  Net -244.43 ml      Assessment/Plan:  77 y.o. male is 1 day post op, s/p: fenestrated EVAR with bilateral renal artery stenting    -Feels great this morning. Denies any pain. He has walked up and down the hall without issue -Hemodynamically stable. Hemoglobin stable at 11.6 -Scr at baseline at 0.9. Good UO > 2250cc in 24H -Bilateral groin cath sites are soft and intact without hematoma or bruising -BLE warm and well perfused with palpable DP pulses  -Tolerating a normal diet without swallowing difficulty -Continue aspirin and statin. Will restart Eliquis tomorrow. Will d/c today with 4 week follow up with Dr.Alaisha [name]   Kyle Dubonnet, PA-C [facility] [phone] 07/21/2023 7:55 AM   I have interviewed and examined patient with PA and agree with assessment and plan above.  Plan to restart Eliquis tomorrow and will need total of 6  months from September 1.  Will also continue aspirin indefinitely given placement of renal artery stents.  Okay for walking as tolerated I have asked him for no lifting greater than 20 pounds for the next 2 weeks and he demonstrates good understanding.  Kyle Sinor C. Randie Heinz, MD [facility] [place] Office: [phone] Pager: [phone]

## 2023-07-21 NOTE — Progress Notes (Signed)
Discharge instructions (including medications) discussed with and copy provided to patient/caregiver 

## 2023-07-22 ENCOUNTER — Telehealth: Payer: Self-pay

## 2023-07-22 ENCOUNTER — Other Ambulatory Visit: Payer: Self-pay

## 2023-07-22 DIAGNOSIS — I7143 Infrarenal abdominal aortic aneurysm, without rupture: Secondary | ICD-10-CM

## 2023-07-22 NOTE — Transitions of Care (Post Inpatient/ED Visit) (Signed)
07/22/2023  Name: Kyle [name] MRN: [id number] DOB: 1945-10-11  Today's TOC FU Call Status: Today's TOC FU Call Status:: Successful TOC FU Call Completed TOC FU Call Complete Date: 07/22/23 Patient's Name and Date of Birth confirmed.  Transition Care Management Follow-up Telephone Call Date of Discharge: 07/21/23 Discharge Facility: [facility]) Type of Discharge: Inpatient Admission Primary Inpatient Discharge Diagnosis:: "AAA" How have you been since you were released from the hospital?: Better (Wife voices pt doing "remarkably well-surgery was an answered prayer"-no pain,slept well last night,appetite good, no issues with elimination,surg site looks good-no s/s of infection) Any questions or concerns?: No  Items Reviewed: Did you receive and understand the discharge instructions provided?: Yes Medications obtained,verified, and reconciled?: Yes (Medications Reviewed) Any new allergies since your discharge?: No Dietary orders reviewed?: Yes Type of Diet Ordered:: low salt/heart healthy Do you have support at home?: Yes People in Home: spouse Name of Support/Comfort Primary Source: [name]  Medications Reviewed Today: Medications Reviewed Today     Reviewed by Charlyn Minerva, RN (Registered Nurse) on 07/22/23 at 1019  Med List Status: <None>   Medication Order Taking? Sig Documenting Provider Last Dose Status Informant  albuterol (VENTOLIN HFA) 108 (90 Base) MCG/ACT inhaler [id number] Yes Inhale 2 puffs into the lungs every 6 (six) hours as needed for wheezing or shortness of breath. Waymon Budge, MD Taking Active Self  apixaban (ELIQUIS) 5 MG TABS tablet [id number] Yes Take 1 tablet (5 mg total) by mouth 2 (two) times daily. Jake Bathe, MD Taking Active Self  aspirin EC 81 MG tablet [id number] Yes Take 1 tablet (81 mg total) by mouth daily at 6 (six) AM. Swallow whole. Schuh, McKenzi P, PA-C Taking Active   atorvastatin (LIPITOR) 40 MG tablet [id number]  Yes Take 1 tablet (40 mg total) by mouth every evening at 6 PM. Jake Bathe, MD Taking Active Self  benazepril-hydrochlorthiazide (LOTENSIN HCT) 20-25 MG tablet [id number] Yes TAKE ONE-HALF TABLET BY MOUTH ONE TIME DAILY Jake Bathe, MD Taking Active Self  cetirizine (ZYRTEC) 10 MG tablet [id number] Yes Take 10 mg by mouth daily. [provider] Taking Active Self  Cyanocobalamin (B-12 PO) [id number] Yes Take 1 tablet by mouth every other day. [provider] Taking Active Self  ferrous sulfate (FEROSUL) 325 (65 FE) MG tablet [id number] Yes TAKE ONE TABLET BY MOUTH ONE TIME DAILY WITH BREAKFAST Jenel Lucks, MD Taking Active Self  Fluticasone-Umeclidin-Vilant (TRELEGY ELLIPTA) 100-62.5-25 MCG/ACT AEPB [id number] Yes Inhale 1 puff into the lungs daily. Waymon Budge, MD Taking Active Self  Multiple Vitamin (MULTIVITAMIN) tablet [id number] Yes Take 1 tablet by mouth daily. [provider] Taking Active Self  NON FORMULARY [id number] Yes Pt uses bi-pap nightly [provider] Taking Active Self  omeprazole (PRILOSEC) 40 MG capsule [id number] Yes Take 1 capsule (40 mg total) by mouth daily. Take by mouth. Jake Bathe, MD Taking Active Self  valACYclovir (VALTREX) 1000 MG tablet [id number] Yes Take 1 g by mouth daily. [provider] Taking Active Self            Home Care and Equipment/Supplies: Were Home Health Services Ordered?: Yes Name of Home Health Agency:: [facility] Has Agency set up a time to come to your home?: Yes First Home Health Visit Date:  (wife voices that agency called but they declined visit-they don't feel like services are needed) Any new equipment or medical supplies ordered?: NA  Functional Questionnaire: Do you need assistance with bathing/showering or  dressing?: No Do you need assistance with meal preparation?: No Do you need assistance with eating?: No Do you have difficulty maintaining continence: No Do you need  assistance with getting out of bed/getting out of a chair/moving?: No Do you have difficulty managing or taking your medications?: No  Follow up appointments reviewed: PCP Follow-up appointment confirmed?: NA Specialist Hospital Follow-up appointment confirmed?: Yes Date of Specialist follow-up appointment?: 08/25/23 Follow-Up Specialty Provider:: Dr. Randie Heinz Do you need transportation to your follow-up appointment?: No Do you understand care options if your condition(s) worsen?: Yes-patient verbalized understanding  SDOH Interventions Today    Flowsheet Row Most Recent Value  SDOH Interventions   Food Insecurity Interventions Intervention Not Indicated  Housing Interventions Intervention Not Indicated  Transportation Interventions Intervention Not Indicated  Utilities Interventions Intervention Not Indicated      Interventions Today    Flowsheet Row Most Recent Value  General Interventions   General Interventions Discussed/Reviewed General Interventions Discussed, Doctor Visits  Doctor Visits Discussed/Reviewed Doctor Visits Discussed, Specialist, PCP  PCP/Specialist Visits Compliance with follow-up visit  Education Interventions   Education Provided Provided Education  Provided Verbal Education On Nutrition, Medication, When to see the doctor, Other  Nutrition Interventions   Nutrition Discussed/Reviewed Adding fruits and vegetables, Nutrition Discussed, Decreasing salt  Pharmacy Interventions   Pharmacy Dicussed/Reviewed Pharmacy Topics Discussed, Medications and their functions  Safety Interventions   Safety Discussed/Reviewed Safety Discussed      TOC Interventions Today    Flowsheet Row Most Recent Value  TOC Interventions   TOC Interventions Discussed/Reviewed TOC Interventions Discussed, Post discharge activity limitations per provider, Post op wound/incision care, S/S of infection       Antionette Fairy, RN,BSN,CCM RN Care Manager Transitions of Care  [facility]  Direct Phone: [phone] Toll Free: [phone] Fax: [phone]

## 2023-07-22 NOTE — Discharge Summary (Signed)
EVAR Discharge Summary   Kyle [name] [birth year] 77 y.o. male  MRN: [id number]  Admission Date: 07/20/2023  Discharge Date: 07/21/2023  Physician: Lemar Livings, MD  Admission Diagnosis: AAA (abdominal aortic aneurysm) [facility]) [I71.40]  Discharge Day Diagnosis: AAA (abdominal aortic aneurysm) [facility]) [I71.40]  Hospital Course:  The patient was admitted to the hospital and taken to the operating room on 07/20/2023 and underwent: 1) Percutaneous ultrasound-guided access of bilateral common femoral arteries with Pro-glide closure, and 2) Zenith fenestrated endovascular aneurysm repair with proximal main body 24 x 124 with SMA scallop and small fenestrations right 9:00 and left 345 extended with distal 12 x 28 x 76 with right common iliac 24 x 56 and left common iliac 13 x 74.  Stenting of the bilateral renal arteries with 6 x 29 mm VBX flared with 9 x 20mm balloon.    The pt tolerated the procedure well and was transported to the PACU in excellent condition.   POD 1 the patient was mobilizing well and tolerating a normal diet without issue. His Hgb was stable at 11.6 with no further signs of bleeding. His groins were soft without hematoma.  His home Eliquis was held and he was instructed to restart this on 07/22/2023. He was started on ASA 81mg  indefinitely.  He was discharged home on POD1.  CBC    Component Value Date/Time   WBC 12.0 (H) 07/21/2023 0420   RBC 3.53 (L) 07/21/2023 0420   HGB 11.6 (L) 07/21/2023 0420   HGB 13.3 05/10/2023 0950   HGB 14.5 09/07/2022 0826   HCT 33.7 (L) 07/21/2023 0420   HCT 41.9 09/07/2022 0826   PLT 112 (L) 07/21/2023 0420   PLT 167 05/10/2023 0950   PLT 154 09/07/2022 0826   MCV 95.5 07/21/2023 0420   MCV 98 (H) 09/07/2022 0826   MCH 32.9 07/21/2023 0420   MCHC 34.4 07/21/2023 0420   RDW 12.4 07/21/2023 0420   RDW 12.3 09/07/2022 0826   LYMPHSABS 1.2 05/10/2023 0950   LYMPHSABS 1.3 09/07/2022 0826   MONOABS 0.6 05/10/2023 0950    EOSABS 0.2 05/10/2023 0950   EOSABS 0.3 09/07/2022 0826   BASOSABS 0.1 05/10/2023 0950   BASOSABS 0.1 09/07/2022 0826    BMET    Component Value Date/Time   NA 137 07/21/2023 0420   NA 141 09/07/2022 0826   K 3.9 07/21/2023 0420   CL 105 07/21/2023 0420   CO2 22 07/21/2023 0420   GLUCOSE 146 (H) 07/21/2023 0420   BUN 14 07/21/2023 0420   BUN 16 09/07/2022 0826   CREATININE 0.90 07/21/2023 0420   CREATININE 0.89 05/10/2023 0950   CREATININE 1.00 04/27/2016 1020   CALCIUM 9.0 07/21/2023 0420   GFRNONAA >60 07/21/2023 0420   GFRNONAA >60 05/10/2023 0950   GFRAA  01/16/2010 0420    >60        The eGFR has been calculated using the MDRD equation. This calculation has not been validated in all clinical situations. eGFR's persistently <60 mL/min signify possible Chronic Kidney Disease.       Discharge Instructions     Call MD for:  redness, tenderness, or signs of infection (pain, swelling, redness, odor or green/yellow discharge around incision site)   Complete by: As directed    Call MD for:  severe uncontrolled pain   Complete by: As directed    Call MD for:  temperature >100.4   Complete by: As directed    Diet - low sodium heart healthy  Complete by: As directed    Increase activity slowly   Complete by: As directed        Discharge Diagnosis:  AAA (abdominal aortic aneurysm) (HCC) [I71.40]  Secondary Diagnosis: Patient Active Problem List   Diagnosis Date Noted   AAA (abdominal aortic aneurysm) (HCC) 07/20/2023   Pulmonary embolus (HCC) 04/04/2023   Renal insufficiency 04/04/2023   Heart failure with mildly reduced ejection fraction (HFmrEF) (HCC) 04/04/2023   Abdominal aortic aneurysm (AAA) (HCC) 01/17/2021   COVID-19 12/13/2020   COPD mixed type (HCC) 09/15/2016   History of radial keratotomy 09/09/2016   Unstable angina (HCC)    Macular retinal cyst of left eye 04/02/2016   Bilateral ocular hypertension 02/26/2016   Central corneal opacity of  left eye 02/26/2016   Chorioretinal scar of left eye after surgery for detachment 02/26/2016   Herpes zoster keratitis of left eye 02/24/2016   Vitreous prolapse of left eye 02/24/2016   Keratoconjunctivitis sicca of left eye not specified as Sjogren's 02/24/2016   Hypothyroidism 06/25/2014   Normocytic anemia 12/14/2013   Obstructive sleep apnea 11/01/2013   Obesity (BMI 30-39.9) 06/16/2013   Prediabetes 06/16/2013   Hypertension 12/11/2011   Hyperlipidemia 12/11/2011   CAD (coronary artery disease) 12/11/2011   Past Medical History:  Diagnosis Date   Anemia    CAD (coronary artery disease) 08/03/2009   Cardiac arrhythmia    COPD (chronic obstructive pulmonary disease) (HCC)    GERD (gastroesophageal reflux disease)    Hyperlipidemia    Hypertension    Hypothyroidism    Myocardial infarction (HCC) 08/03/2009   Sleep apnea treated with nocturnal BiPAP      Allergies as of 07/21/2023   No Known Allergies      Medication List     TAKE these medications    albuterol 108 (90 Base) MCG/ACT inhaler Commonly known as: VENTOLIN HFA Inhale 2 puffs into the lungs every 6 (six) hours as needed for wheezing or shortness of breath.   apixaban 5 MG Tabs tablet Commonly known as: ELIQUIS Take 1 tablet (5 mg total) by mouth 2 (two) times daily.   aspirin EC 81 MG tablet Take 1 tablet (81 mg total) by mouth daily at 6 (six) AM. Swallow whole.   atorvastatin 40 MG tablet Commonly known as: LIPITOR Take 1 tablet (40 mg total) by mouth every evening at 6 PM.   B-12 PO Take 1 tablet by mouth every other day.   benazepril-hydrochlorthiazide 20-25 MG tablet Commonly known as: LOTENSIN HCT TAKE ONE-HALF TABLET BY MOUTH ONE TIME DAILY   cetirizine 10 MG tablet Commonly known as: ZYRTEC Take 10 mg by mouth daily.   FeroSul 325 (65 Fe) MG tablet Generic drug: ferrous sulfate TAKE ONE TABLET BY MOUTH ONE TIME DAILY WITH BREAKFAST   multivitamin tablet Take 1 tablet by mouth  daily.   NON FORMULARY Pt uses bi-pap nightly   omeprazole 40 MG capsule Commonly known as: PRILOSEC Take 1 capsule (40 mg total) by mouth daily. Take by mouth.   Trelegy Ellipta 100-62.5-25 MCG/ACT Aepb Generic drug: Fluticasone-Umeclidin-Vilant Inhale 1 puff into the lungs daily.   valACYclovir 1000 MG tablet Commonly known as: VALTREX Take 1 g by mouth daily.        Discharge Instructions:   [facility]  Discharge Instructions Endovascular Aortic Aneurysm Repair  Please refer to the following instructions for your post-procedure care. Your surgeon or Physician Assistant will discuss any changes with you.  Activity  You  are encouraged to walk as much as you can. You can slowly return to normal activities but must avoid strenuous activity and heavy lifting until your doctor tells you it's OK. Avoid activities such as vacuuming or swinging a gold club. It is normal to feel tired for several weeks after your surgery. Do not drive until your doctor gives the OK and you are no longer taking prescription pain medications. It is also normal to have difficulty with sleep habits, eating, and bowel movements after surgery. These will go away with time.  Bathing/Showering  You may shower after you go home. If you have an incision, do not soak in a bathtub, hot tub, or swim until the incision heals completely.  Incision Care  Shower every day. Clean your incision with mild soap and water. Pat the area dry with a clean towel. You do not need a bandage unless otherwise instructed. Do not apply any ointments or creams to your incision. If you clothing is irritating, you may cover your incision with a dry gauze pad.  Diet  Resume your normal diet. There are no special food restrictions following this procedure. A low fat/low cholesterol diet is recommended for all patients with vascular disease. In order to heal from your surgery, it is CRITICAL to get  adequate nutrition. Your body requires vitamins, minerals, and protein. Vegetables are the best source of vitamins and minerals. Vegetables also provide the perfect balance of protein. Processed food has little nutritional value, so try to avoid this.  Medications  Resume taking all of your medications unless your doctor or Physician Assistant tells you not to. If your incision is causing pain, you may take over-the-counter pain relievers such as acetaminophen (Tylenol). If you were prescribed a stronger pain medication, please be aware these medications can cause nausea and constipation. Prevent nausea by taking the medication with a snack or meal. Avoid constipation by drinking plenty of fluids and eating foods with a high amount of fiber, such as fruits, vegetables, and grains. Do not take Tylenol if you are taking prescription pain medications.   Follow up  Our office will schedule a follow-up appointment with a C.T. scan 3-4 weeks after your surgery.  Please call us immediately for any of the following conditions  Severe or worsening pain in your legs or feet or in your abdomen back or chest. Increased pain, redness, drainage (pus) from your incision sit. Increased abdominal pain, bloating, nausea, vomiting or persistent diarrhea. Fever of 101 degrees or higher. Swelling in your leg (s),  Reduce your risk of vascular disease  Stop smoking. If you would like help call [facility] at 1-800-QUIT-NOW (3672432892) or [place] at [phone]. Manage your cholesterol Maintain a desired weight Control your diabetes Keep your blood pressure down  If you have questions, please call the office at [phone].   Disposition: Home  Patient's condition: is Excellent  Follow up: 1. Dr. Randie Heinz in 4 weeks with CTA protocol   Loel Dubonnet, PA-C [facility] [phone] 07/22/2023  7:16 AM   - For VQI Registry use - Post-op:  Time to Extubation: [x]  In OR, [  ] < 12 hrs, [ ]  12-24 hrs, [ ]  >=24 hrs Vasopressors Req. Post-op: No MI: No., [ ]  Troponin only, [ ]  EKG or Clinical New Arrhythmia: No CHF: No ICU Stay: 0 days Transfusion: No     If yes,  units given  Complications: Resp failure: No., [ ]  Pneumonia, [ ]  Ventilator Chg in renal function:  No., [ ]  Inc. Cr > 0.5, [ ]  Temp. Dialysis,  [ ]  Permanent dialysis Leg ischemia: No., no Surgery needed, [ ]  Yes, Surgery needed,  [ ]  Amputation Bowel ischemia: No., [ ]  Medical Rx, [ ]  Surgical Rx Wound complication: No., [ ]  Superficial separation/infection, [ ]  Return to OR Return to OR: No  Return to OR for bleeding: No Stroke: No., [ ]  Minor, [ ]  Major  Discharge medications: Statin use:  Yes  ASA use:  Yes  Plavix use:  No  Beta blocker use:  No  ARB use:  No ACEI use:  Yes CCB use:  No

## 2023-07-29 ENCOUNTER — Encounter: Payer: Self-pay | Admitting: Family Medicine

## 2023-08-01 DIAGNOSIS — G4733 Obstructive sleep apnea (adult) (pediatric): Secondary | ICD-10-CM | POA: Diagnosis not present

## 2023-08-18 ENCOUNTER — Ambulatory Visit (HOSPITAL_COMMUNITY)
Admission: RE | Admit: 2023-08-18 | Discharge: 2023-08-18 | Disposition: A | Discharge status: Home / Self Care | Payer: Medicare HMO | Source: Ambulatory Visit | Attending: Vascular Surgery | Admitting: Vascular Surgery

## 2023-08-18 DIAGNOSIS — I700 Atherosclerosis of aorta: Secondary | ICD-10-CM | POA: Diagnosis not present

## 2023-08-18 DIAGNOSIS — I714 Abdominal aortic aneurysm, without rupture, unspecified: Secondary | ICD-10-CM | POA: Diagnosis not present

## 2023-08-18 DIAGNOSIS — I7143 Infrarenal abdominal aortic aneurysm, without rupture: Secondary | ICD-10-CM | POA: Diagnosis not present

## 2023-08-18 MED ORDER — IOHEXOL 350 MG/ML SOLN: 75.0000 mL | Freq: Once | INTRAVENOUS | Status: DC | PRN

## 2023-08-18 MED ORDER — IOHEXOL 350 MG/ML SOLN
75.0000 mL | Freq: Once | INTRAVENOUS | Status: AC | PRN
  Administered 2023-08-18: 75 mL via INTRAVENOUS

## 2023-08-19 ENCOUNTER — Ambulatory Visit: Payer: Medicare HMO | Admitting: Nurse Practitioner

## 2023-08-19 ENCOUNTER — Encounter: Payer: Self-pay | Admitting: Nurse Practitioner

## 2023-08-19 VITALS — BP 140/68 | HR 83 | Ht 71.0 in | Wt 205.4 lb

## 2023-08-19 DIAGNOSIS — J449 Chronic obstructive pulmonary disease, unspecified: Secondary | ICD-10-CM

## 2023-08-19 DIAGNOSIS — G4733 Obstructive sleep apnea (adult) (pediatric): Secondary | ICD-10-CM

## 2023-08-19 NOTE — Assessment & Plan Note (Signed)
OSA on BiPAP. Excellent compliance and control. He does have some moderate leaks but do not appear to be impacting him. Feels like ramp pressure could be increased - adjust from 4 cmH2O to 8 cmH2O. Advised to notify if this is uncomfortable. Continue set pressure 15/11 cmH2O. Aware of proper care/use. Understands risk of untreated OSA. Receives benefit from use. Safe driving practices reviewed.   Patient Instructions  Continue to use BiPAP every night, minimum of 4-6 hours a night.  Change equipment as directed. Wash your tubing with warm soap and water daily, hang to dry. Wash humidifier portion weekly. Use bottled, distilled water and change daily Be aware of reduced alertness and do not drive or operate heavy machinery if experiencing this or drowsiness.  Exercise encouraged, as tolerated. Healthy weight management discussed.  Avoid or decrease alcohol consumption and medications that make you more sleepy, if possible. Notify if persistent daytime sleepiness occurs even with consistent use of PAP therapy.  Continue Trelegy 1 puff daily. Brush tongue and rinse mouth afterwards Continue Albuterol inhaler 2 puffs every 6 hours as needed for shortness of breath or wheezing.   Follow up with Dr. Maple Hudson as scheduled. If symptoms worsen, please contact office for sooner follow up or seek emergency care.

## 2023-08-19 NOTE — Patient Instructions (Addendum)
Continue to use BiPAP every night, minimum of 4-6 hours a night.  Change equipment as directed. Wash your tubing with warm soap and water daily, hang to dry. Wash humidifier portion weekly. Use bottled, distilled water and change daily Be aware of reduced alertness and do not drive or operate heavy machinery if experiencing this or drowsiness.  Exercise encouraged, as tolerated. Healthy weight management discussed.  Avoid or decrease alcohol consumption and medications that make you more sleepy, if possible. Notify if persistent daytime sleepiness occurs even with consistent use of PAP therapy.  Continue Trelegy 1 puff daily. Brush tongue and rinse mouth afterwards Continue Albuterol inhaler 2 puffs every 6 hours as needed for shortness of breath or wheezing.   Follow up with Dr. Maple Hudson as scheduled. If symptoms worsen, please contact office for sooner follow up or seek emergency care.

## 2023-08-19 NOTE — Assessment & Plan Note (Signed)
Compensated on current regimen. No recent exacerbations. Continue triple therapy regimen. Action plan in place

## 2023-08-19 NOTE — Progress Notes (Signed)
@Patient  ID: Kyle [name], male    DOB: June 06, 1946, 78 y.o.   MRN: [id number]  Chief Complaint  Patient presents with   Follow-up    OSA F/U visit    Referring provider: Kristian Covey, MD  HPI: 78 year old male, former smoker followed for OSA on BiPAP and COPD mixed type.  He is a patient of Dr. Roxy Cedar and last seen in office 05/27/2023.  Past medical history significant for infrarenal AAA without rupture, chronic rhinitis, GERD, CAD, history of MI, hypertension, history of PE on chronic AC.  TEST/EVENTS:  01/05/2014 HST: moderate OSA with AHI 19/h; weight 233 lb  09/22/2016 PFT: severe COPD; FVC 75, FEV1 49, ratio 49, TLC 123, DLCO 56 03/18/2018 BiPAP titration >> optimal pressure 15/11 cmH2O; SpO2 low at optimal pressure 90%  05/27/2023: OV with Dr. Maple Hudson.  On Trelegy for COPD and well-controlled.  Download on BiPAP with 100% compliance and residual AHI 3.5/h.  AAA is being followed.  Preparing for repair.  Due for replacement machine.  Orders placed.  08/19/2023: Today - follow up Patient presents today for follow up after getting new BiPAP machine. He likes his new machine. They also switched his mask to a hybrid full face, which he feels like works much better for him especially when he sleeps on his side. Still has some leaks but they don't wake him up like they used to. Sleeping well with it. Feels rested. Does feel like his ramp up pressures could be higher but otherwise, pressures feel fine. No issues with excessive daytime sleepiness or drowsy driving. No morning headaches. Breathing is stable. No increased cough or chest congestion. Not noticing much wheezing. Using his Trelegy daily. Doesn't have to use albuterol very often. Had AAA repair, which went well. No complications.   07/19/2023-08/17/2023: BiPAP 15/11 30/30 days; 93% >4 hr; average use 7 hr 6 min Leaks 42 AHI 4  No Known Allergies  Immunization History  Administered Date(s) Administered   Fluad Quad(high  Dose 65+) 04/11/2019, 04/15/2020, 05/12/2021, 04/29/2022   Fluad Trivalent(High Dose 65+) 04/12/2023   Influenza Split 05/17/2012, 05/10/2014, 05/03/2016, 04/03/2017   Influenza, High Dose Seasonal PF 04/03/2016, 04/06/2017, 04/03/2018, 04/19/2018   Influenza,inj,Quad PF,6+ Mos 04/13/2013, 04/29/2015   Influenza-Unspecified 04/11/2019, 08/03/2022   PFIZER Comirnaty(Gray Top)Covid-19 Tri-Sucrose Vaccine 12/09/2020   PFIZER(Purple Top)SARS-COV-2 Vaccination 09/10/2019, 10/04/2019, 05/03/2020, 12/07/2020   Pfizer(Comirnaty)Fall Seasonal Vaccine 12 years and older 06/05/2022   Pneumococcal Conjugate-13 03/13/2016   Pneumococcal Polysaccharide-23 05/17/2012   Tdap 11/12/2011, 03/13/2016, 08/24/2018   Unspecified SARS-COV-2 Vaccination 04/02/2023   Zoster Recombinant(Shingrix) 08/23/2018, 02/20/2019   Zoster, Live 06/16/2013    Past Medical History:  Diagnosis Date   Anemia    CAD (coronary artery disease) 08/03/2009   Cardiac arrhythmia    COPD (chronic obstructive pulmonary disease) (HCC)    GERD (gastroesophageal reflux disease)    Hyperlipidemia    Hypertension    Hypothyroidism    Myocardial infarction (HCC) 08/03/2009   Sleep apnea treated with nocturnal BiPAP     Tobacco History: Social History   Tobacco Use  Smoking Status Former   Current packs/day: 0.00   Average packs/day: 4.0 packs/day for 28.0 years (112.0 ttl pk-yrs)   Types: Cigarettes   Start date: 12/10/1965   Quit date: 12/10/1993   Years since quitting: 29.7  Smokeless Tobacco Never   Counseling given: Not Answered   Outpatient Medications Prior to Visit  Medication Sig Dispense Refill   albuterol (VENTOLIN HFA) 108 (90 Base) MCG/ACT inhaler Inhale 2  puffs into the lungs every 6 (six) hours as needed for wheezing or shortness of breath. 6.7 g 11   apixaban (ELIQUIS) 5 MG TABS tablet Take 1 tablet (5 mg total) by mouth 2 (two) times daily. 60 tablet 5   aspirin EC 81 MG tablet Take 1 tablet (81 mg total)  by mouth daily at 6 (six) AM. Swallow whole. 30 tablet 12   atorvastatin (LIPITOR) 40 MG tablet Take 1 tablet (40 mg total) by mouth every evening at 6 PM. 90 tablet 3   benazepril-hydrochlorthiazide (LOTENSIN HCT) 20-25 MG tablet TAKE ONE-HALF TABLET BY MOUTH ONE TIME DAILY 45 tablet 3   cetirizine (ZYRTEC) 10 MG tablet Take 10 mg by mouth daily.     Cyanocobalamin (B-12 PO) Take 1 tablet by mouth every other day.     ferrous sulfate (FEROSUL) 325 (65 FE) MG tablet TAKE ONE TABLET BY MOUTH ONE TIME DAILY WITH BREAKFAST 90 tablet 1   Fluticasone-Umeclidin-Vilant (TRELEGY ELLIPTA) 100-62.5-25 MCG/ACT AEPB Inhale 1 puff into the lungs daily. 60 each 11   Multiple Vitamin (MULTIVITAMIN) tablet Take 1 tablet by mouth daily.     NON FORMULARY Pt uses bi-pap nightly     omeprazole (PRILOSEC) 40 MG capsule Take 1 capsule (40 mg total) by mouth daily. Take by mouth. 30 capsule 11   valACYclovir (VALTREX) 1000 MG tablet Take 1 g by mouth daily.     No facility-administered medications prior to visit.     Review of Systems:   Constitutional: No weight loss or gain, night sweats, fevers, chills, fatigue, or lassitude. HEENT: No headaches, difficulty swallowing, tooth/dental problems, or sore throat. No sneezing, itching, ear ache, nasal congestion, or post nasal drip CV:  No chest pain, orthopnea, PND, swelling in lower extremities, anasarca, dizziness, palpitations, syncope Resp: + Baseline shortness of breath with exertion; minimal chronic cough. No excess mucus or change in color of mucus. No hemoptysis. No wheezing.  No chest wall deformity GI:  No heartburn, indigestion GU: No nocturia Neuro: No dizziness or lightheadedness.  Psych: No depression or anxiety. Mood stable.     Physical Exam:  BP (!) 140/68 (BP Location: Right Arm, Patient Position: Sitting, Cuff Size: Large)   Pulse 83   Ht 5\' 11"  (1.803 m)   Wt 205 lb 6.4 oz (93.2 kg)   SpO2 96%   BMI 28.65 kg/m   GEN: Pleasant,  interactive, well-appearing; in no acute distress HEENT:  Normocephalic and atraumatic. PERRLA. Sclera white. Nasal turbinates pink, moist and patent bilaterally. No rhinorrhea present. Oropharynx pink and moist, without exudate or edema. No lesions, ulcerations, or postnasal drip. Mallampati III NECK:  Supple w/ fair ROM. No JVD present. Thyroid symmetrical with no goiter or nodules palpated. No lymphadenopathy.   CV: RRR, no m/r/g, no peripheral edema. Pulses intact, +2 bilaterally. No cyanosis, pallor or clubbing. PULMONARY:  Unlabored, regular breathing. Clear bilaterally A&P w/o wheezes/rales/rhonchi. No accessory muscle use.  GI: BS present and normoactive. Soft, non-tender to palpation. No organomegaly or masses detected.  MSK: No erythema, warmth or tenderness. Cap refil <2 sec all extrem.   Neuro: A/Ox3. No focal deficits noted.   Skin: Warm, no lesions or rashe Psych: Normal affect and behavior. Judgement and thought content appropriate.     Lab Results:  CBC    Component Value Date/Time   WBC 12.0 (H) 07/21/2023 0420   RBC 3.53 (L) 07/21/2023 0420   HGB 11.6 (L) 07/21/2023 0420   HGB 13.3 05/10/2023 0950  HGB 14.5 09/07/2022 0826   HCT 33.7 (L) 07/21/2023 0420   HCT 41.9 09/07/2022 0826   PLT 112 (L) 07/21/2023 0420   PLT 167 05/10/2023 0950   PLT 154 09/07/2022 0826   MCV 95.5 07/21/2023 0420   MCV 98 (H) 09/07/2022 0826   MCH 32.9 07/21/2023 0420   MCHC 34.4 07/21/2023 0420   RDW 12.4 07/21/2023 0420   RDW 12.3 09/07/2022 0826   LYMPHSABS 1.2 05/10/2023 0950   LYMPHSABS 1.3 09/07/2022 0826   MONOABS 0.6 05/10/2023 0950   EOSABS 0.2 05/10/2023 0950   EOSABS 0.3 09/07/2022 0826   BASOSABS 0.1 05/10/2023 0950   BASOSABS 0.1 09/07/2022 0826    BMET    Component Value Date/Time   NA 137 07/21/2023 0420   NA 141 09/07/2022 0826   K 3.9 07/21/2023 0420   CL 105 07/21/2023 0420   CO2 22 07/21/2023 0420   GLUCOSE 146 (H) 07/21/2023 0420   BUN 14 07/21/2023  0420   BUN 16 09/07/2022 0826   CREATININE 0.90 07/21/2023 0420   CREATININE 0.89 05/10/2023 0950   CREATININE 1.00 04/27/2016 1020   CALCIUM 9.0 07/21/2023 0420   GFRNONAA >60 07/21/2023 0420   GFRNONAA >60 05/10/2023 0950   GFRAA  01/16/2010 0420    >60        The eGFR has been calculated using the MDRD equation. This calculation has not been validated in all clinical situations. eGFR's persistently <60 mL/min signify possible Chronic Kidney Disease.    BNP    Component Value Date/Time   BNP 153.2 (H) 04/04/2023 0736     Imaging:  No results found.  Administration History     None          Latest Ref Rng & Units 09/22/2016    8:56 AM  PFT Results  FVC-Pre L 3.59   FVC-Predicted Pre % 76   FVC-Post L 3.54   FVC-Predicted Post % 75   Pre FEV1/FVC % % 47   Post FEV1/FCV % % 49   FEV1-Pre L 1.68   FEV1-Predicted Pre % 48   FEV1-Post L 1.72   DLCO uncorrected ml/min/mmHg 19.55   DLCO UNC% % 56   DLCO corrected ml/min/mmHg 21.21   DLCO COR %Predicted % 61   DLVA Predicted % 68   TLC L 9.09   TLC % Predicted % 123   RV % Predicted % 180     No results found for: "NITRICOXIDE"      Assessment & Plan:   Obstructive sleep apnea OSA on BiPAP. Excellent compliance and control. He does have some moderate leaks but do not appear to be impacting him. Feels like ramp pressure could be increased - adjust from 4 cmH2O to 8 cmH2O. Advised to notify if this is uncomfortable. Continue set pressure 15/11 cmH2O. Aware of proper care/use. Understands risk of untreated OSA. Receives benefit from use. Safe driving practices reviewed.   Patient Instructions  Continue to use BiPAP every night, minimum of 4-6 hours a night.  Change equipment as directed. Wash your tubing with warm soap and water daily, hang to dry. Wash humidifier portion weekly. Use bottled, distilled water and change daily Be aware of reduced alertness and do not drive or operate heavy machinery if  experiencing this or drowsiness.  Exercise encouraged, as tolerated. Healthy weight management discussed.  Avoid or decrease alcohol consumption and medications that make you more sleepy, if possible. Notify if persistent daytime sleepiness occurs even with consistent use of  PAP therapy.  Continue Trelegy 1 puff daily. Brush tongue and rinse mouth afterwards Continue Albuterol inhaler 2 puffs every 6 hours as needed for shortness of breath or wheezing.   Follow up with Dr. Maple Hudson as scheduled. If symptoms worsen, please contact office for sooner follow up or seek emergency care.    COPD mixed type (HCC) Compensated on current regimen. No recent exacerbations. Continue triple therapy regimen. Action plan in place   Advised if symptoms do not improve or worsen, to please contact office for sooner follow up or seek emergency care.   I spent 31 minutes of dedicated to the care of this patient on the date of this encounter to include pre-visit review of records, face-to-face time with the patient discussing conditions above, post visit ordering of testing, clinical documentation with the electronic health record, making appropriate referrals as documented, and communicating necessary findings to members of the patients care team.  Noemi Chapel, NP 08/19/2023  Pt aware and understands NP's role.

## 2023-08-20 ENCOUNTER — Encounter: Payer: Self-pay | Admitting: Nurse Practitioner

## 2023-08-23 ENCOUNTER — Other Ambulatory Visit: Payer: Self-pay | Admitting: Family Medicine

## 2023-08-25 ENCOUNTER — Ambulatory Visit (INDEPENDENT_AMBULATORY_CARE_PROVIDER_SITE_OTHER): Payer: Medicare HMO | Admitting: Vascular Surgery

## 2023-08-25 ENCOUNTER — Encounter: Payer: Self-pay | Admitting: Vascular Surgery

## 2023-08-25 VITALS — BP 131/80 | HR 77 | Temp 98.2°F | Resp 20 | Ht 71.0 in | Wt 203.0 lb

## 2023-08-25 DIAGNOSIS — I7143 Infrarenal abdominal aortic aneurysm, without rupture: Secondary | ICD-10-CM

## 2023-08-25 NOTE — Progress Notes (Signed)
Patient ID: Kyle [name], male   DOB: 11-Mar-1946, 78 y.o.   MRN: [id number]  Reason for Consult: No chief complaint on file.   Referred by Kristian Covey, MD  Subjective:     HPI:  Kyle [name] is a 78 y.o. male follows up after fEVAR.  He does not have any new complaints using walker without limitations and is hopeful to get back into the gym.  Past Medical History:  Diagnosis Date   Anemia    CAD (coronary artery disease) 08/03/2009   Cardiac arrhythmia    COPD (chronic obstructive pulmonary disease) (HCC)    GERD (gastroesophageal reflux disease)    Hyperlipidemia    Hypertension    Hypothyroidism    Myocardial infarction (HCC) 08/03/2009   Sleep apnea treated with nocturnal BiPAP    Family History  Problem Relation Age of Onset   Stroke Mother    Hypertension Mother    Stroke Father    Hypotension Father    Multiple sclerosis Sister    Thyroid disease Brother    Past Surgical History:  Procedure Laterality Date   ABDOMINAL AORTIC ENDOVASCULAR FENESTRATED STENT GRAFT N/A 07/20/2023   Procedure: ABDOMINAL AORTIC ENDOVASCULAR FENESTRATED STENT GRAFT;  Surgeon: Maeola Harman, MD;  Location: [facility] OR;  Service: Vascular;  Laterality: N/A;   APPENDECTOMY     CARDIAC CATHETERIZATION N/A 05/05/2016   Procedure: Left Heart Cath and Coronary Angiography;  Surgeon: Jake Bathe, MD;  Location: [facility] INVAS[facility];  Service: Cardiovascular;  Laterality: N/A;   CORONARY ANGIOPLASTY WITH STENT PLACEMENT     EYE SURGERY     lasix /BIL   TONSILLECTOMY     ULTRASOUND GUIDANCE FOR VASCULAR ACCESS Bilateral 07/20/2023   Procedure: ULTRASOUND GUIDANCE FOR VASCULAR ACCESS, BILATERAL FEMORAL ARTERIES;  Surgeon: Maeola Harman, MD;  Location: [facility] OR;  Service: Vascular;  Laterality: Bilateral;    Short Social History:  Social History   Tobacco Use   Smoking status: Former    Current packs/day: 0.00    Average packs/day: 4.0 packs/day for 28.0 years  (112.0 ttl pk-yrs)    Types: Cigarettes    Start date: 12/10/1965    Quit date: 12/10/1993    Years since quitting: 29.7   Smokeless tobacco: Never  Substance Use Topics   Alcohol use: Yes    Alcohol/week: 5.0 standard drinks of alcohol    Types: 5 Standard drinks or equivalent per week    Comment: once every 2 months    No Known Allergies  Current Outpatient Medications  Medication Sig Dispense Refill   albuterol (VENTOLIN HFA) 108 (90 Base) MCG/ACT inhaler Inhale 2 puffs into the lungs every 6 (six) hours as needed for wheezing or shortness of breath. 6.7 g 11   apixaban (ELIQUIS) 5 MG TABS tablet Take 1 tablet (5 mg total) by mouth 2 (two) times daily. 60 tablet 5   aspirin EC 81 MG tablet Take 1 tablet (81 mg total) by mouth daily at 6 (six) AM. Swallow whole. 30 tablet 12   atorvastatin (LIPITOR) 40 MG tablet Take 1 tablet (40 mg total) by mouth every evening at 6 PM. 90 tablet 3   benazepril-hydrochlorthiazide (LOTENSIN HCT) 20-25 MG tablet TAKE ONE-HALF TABLET BY MOUTH ONE TIME DAILY 45 tablet 3   cetirizine (ZYRTEC) 10 MG tablet Take 10 mg by mouth daily.     Cyanocobalamin (B-12 PO) Take 1 tablet by mouth every other day.     ferrous sulfate (FEROSUL)  325 (65 FE) MG tablet TAKE ONE TABLET BY MOUTH ONE TIME DAILY WITH BREAKFAST 90 tablet 1   Fluticasone-Umeclidin-Vilant (TRELEGY ELLIPTA) 100-62.5-25 MCG/ACT AEPB Inhale 1 puff into the lungs daily. 60 each 11   Multiple Vitamin (MULTIVITAMIN) tablet Take 1 tablet by mouth daily.     NON FORMULARY Pt uses bi-pap nightly     omeprazole (PRILOSEC) 40 MG capsule Take 1 capsule (40 mg total) by mouth daily. Take by mouth. 30 capsule 11   valACYclovir (VALTREX) 1000 MG tablet Take 1 g by mouth daily.     No current facility-administered medications for this visit.    REVIEW OF SYSTEMS  No complaints today    Objective:  Objective   Vitals:   08/25/23 1422  BP: 131/80  Pulse: 77  Resp: 20  Temp: 98.2 F (36.8 C)  SpO2:  95%     Physical Exam Awake alert and oriented Only respirations Abdomen is soft and nontender Bilateral common femoral pulses are palpable and 2+ palpable popliteal pulses  Data: CT IMPRESSION: VASCULAR   1. Successful complex endovascular repair of infrarenal abdominal aortic aneurysm using a fenestrated bifurcated endoprosthesis. There is a small type 2 endoleak in the posteroinferior aspect of the aneurysm sac due to the L4 lumbar arteries. However, the excluded aneurysm sac size remains unchanged at 5.8 x 5.6 cm. Widely patent celiac axis and SMA at the fenestration sites. Both renal artery snorkel stents are widely patent as well. 2. No evidence of access vessel complication.       Assessment/Plan:     78 year old male status post fenestrated endograft and for large juxtarenal abdominal aortic aneurysm now recovering well.  There is evidence of type II endoleak with stable sac size.  Plan to follow-up in 9 months with EVAR duplex and we will plan for duplex surveillance unless there is enlargement of the aneurysm sac moving forward.     Maeola Harman MD [facility]

## 2023-08-26 ENCOUNTER — Encounter: Payer: Self-pay | Admitting: Vascular Surgery

## 2023-08-26 ENCOUNTER — Telehealth: Payer: Self-pay | Admitting: Hematology and Oncology

## 2023-08-26 NOTE — Telephone Encounter (Signed)
Canceled appointments per 1/22 voicemail. Called the patient to confirm only wanting to cancel. Patients wife stated he was feeling better and no longer needed the appointments.

## 2023-09-01 DIAGNOSIS — G4733 Obstructive sleep apnea (adult) (pediatric): Secondary | ICD-10-CM | POA: Diagnosis not present

## 2023-09-09 DIAGNOSIS — G4733 Obstructive sleep apnea (adult) (pediatric): Secondary | ICD-10-CM | POA: Diagnosis not present

## 2023-09-14 ENCOUNTER — Other Ambulatory Visit: Payer: Self-pay

## 2023-09-14 DIAGNOSIS — I7143 Infrarenal abdominal aortic aneurysm, without rupture: Secondary | ICD-10-CM

## 2023-10-01 DIAGNOSIS — G4733 Obstructive sleep apnea (adult) (pediatric): Secondary | ICD-10-CM | POA: Diagnosis not present

## 2023-10-04 DIAGNOSIS — H4911 Fourth [trochlear] nerve palsy, right eye: Secondary | ICD-10-CM | POA: Diagnosis not present

## 2023-10-04 DIAGNOSIS — H532 Diplopia: Secondary | ICD-10-CM | POA: Diagnosis not present

## 2023-10-04 DIAGNOSIS — H18792 Other corneal deformities, left eye: Secondary | ICD-10-CM | POA: Diagnosis not present

## 2023-10-04 DIAGNOSIS — H5021 Vertical strabismus, right eye: Secondary | ICD-10-CM | POA: Diagnosis not present

## 2023-10-06 ENCOUNTER — Other Ambulatory Visit: Payer: Medicare HMO

## 2023-10-06 ENCOUNTER — Ambulatory Visit: Payer: Medicare HMO | Admitting: Hematology and Oncology

## 2023-10-11 ENCOUNTER — Ambulatory Visit (INDEPENDENT_AMBULATORY_CARE_PROVIDER_SITE_OTHER): Admitting: Family Medicine

## 2023-10-11 ENCOUNTER — Encounter: Payer: Self-pay | Admitting: Family Medicine

## 2023-10-11 VITALS — BP 130/66 | HR 87 | Temp 97.6°F | Wt 202.0 lb

## 2023-10-11 DIAGNOSIS — K219 Gastro-esophageal reflux disease without esophagitis: Secondary | ICD-10-CM | POA: Diagnosis not present

## 2023-10-11 DIAGNOSIS — R490 Dysphonia: Secondary | ICD-10-CM

## 2023-10-11 DIAGNOSIS — R1314 Dysphagia, pharyngoesophageal phase: Secondary | ICD-10-CM

## 2023-10-11 NOTE — Patient Instructions (Signed)
 I will be ordering modified barium swallow study to evaluate your swallowing.Marland Kitchen

## 2023-10-11 NOTE — Progress Notes (Signed)
 Established Patient Office Visit  Subjective   Patient ID: Kyle [name], male    DOB: 1946-06-14  Age: 78 y.o. MRN: [id number]  Chief Complaint  Patient presents with   Hoarse   Gastroesophageal Reflux    HPI   Kyle [name] is seen with persistent hoarseness and trouble swallowing certain foods for quite some time.  He has had some hoarseness for well over a year.  He does have past history of smoking and quit almost 30 years ago but smoked about 3 to 4 packs/day for about 28 years.  He saw ENT physician with [facility] back in October and had nasolaryngoscopy with no lesions noted.  Diagnosis of pharyngoesophageal dysphagia.  No concerning lesions noted.  Patient placed on omeprazole 40 mg daily but has not seen any improvement in symptoms.  He has good appetite.  He notices when he eats foods especially such as rice or corn he feels like they are not going down well.  He frequently has to clear his throat sometimes hours later and is regurgitating up food particles.  In looking back over ENT notes there have been discussion of modified barium swallow but apparently this was never ordered.  Patient did have a EGD 9/22 and evaluating iron deficiency.  Those results were reviewed and at that time his oropharynx, larynx, esophagus were reviewed is normal.  A few small sessile polyps in the gastric body with no evidence for recent bleeding.  There is no endoscopic abnormality to explain iron deficiency anemia.  Generally has no problems swallowing liquids.  He is not aware of any active reflux symptoms.  Tries to avoid eating late at night.  He has been taken omeprazole 40 mg daily consistently.  He has not had any significant or consistent cough with eating.  Past Medical History:  Diagnosis Date   Anemia    CAD (coronary artery disease) 08/03/2009   Cardiac arrhythmia    COPD (chronic obstructive pulmonary disease) (HCC)    GERD (gastroesophageal reflux disease)    Hyperlipidemia     Hypertension    Hypothyroidism    Myocardial infarction (HCC) 08/03/2009   Sleep apnea treated with nocturnal BiPAP    Past Surgical History:  Procedure Laterality Date   ABDOMINAL AORTIC ENDOVASCULAR FENESTRATED STENT GRAFT N/A 07/20/2023   Procedure: ABDOMINAL AORTIC ENDOVASCULAR FENESTRATED STENT GRAFT;  Surgeon: Maeola Harman, MD;  Location: [facility] OR;  Service: Vascular;  Laterality: N/A;   APPENDECTOMY     CARDIAC CATHETERIZATION N/A 05/05/2016   Procedure: Left Heart Cath and Coronary Angiography;  Surgeon: Jake Bathe, MD;  Location: [facility] INVASIVE CV LAB;  Service: Cardiovascular;  Laterality: N/A;   CORONARY ANGIOPLASTY WITH STENT PLACEMENT     EYE SURGERY     lasix /BIL   TONSILLECTOMY     ULTRASOUND GUIDANCE FOR VASCULAR ACCESS Bilateral 07/20/2023   Procedure: ULTRASOUND GUIDANCE FOR VASCULAR ACCESS, BILATERAL FEMORAL ARTERIES;  Surgeon: Maeola Harman, MD;  Location: [facility] OR;  Service: Vascular;  Laterality: Bilateral;    reports that he quit smoking about 29 years ago. His smoking use included cigarettes. He started smoking about 57 years ago. He has a 112 pack-year smoking history. He has never used smokeless tobacco. He reports current alcohol use of about 5.0 standard drinks of alcohol per week. He reports that he does not use drugs. family history includes Hypertension in his mother; Hypotension in his father; Multiple sclerosis in his sister; Stroke in his father and mother; Thyroid disease  in his brother. No Known Allergies  Review of Systems  Constitutional:  Negative for weight loss.  Respiratory:  Negative for cough and shortness of breath.   Cardiovascular:  Negative for chest pain.  Gastrointestinal:  Negative for abdominal pain.      Objective:     BP 130/66 (BP Location: Left Arm, Patient Position: Sitting, Cuff Size: Normal)   Pulse 87   Temp 97.6 F (36.4 C) (Oral)   Wt 202 lb (91.6 kg)   SpO2 95%   BMI 28.17 kg/m  BP Readings  from Last 3 Encounters:  10/11/23 130/66  08/25/23 131/80  08/19/23 (!) 140/68   Wt Readings from Last 3 Encounters:  10/11/23 202 lb (91.6 kg)  08/25/23 203 lb (92.1 kg)  08/19/23 205 lb 6.4 oz (93.2 kg)      Physical Exam Vitals reviewed.  Constitutional:      General: He is not in acute distress.    Appearance: He is not ill-appearing.  HENT:     Mouth/Throat:     Mouth: Mucous membranes are moist.     Pharynx: Oropharynx is clear.  Cardiovascular:     Rate and Rhythm: Normal rate and regular rhythm.  Pulmonary:     Effort: Pulmonary effort is normal.     Breath sounds: Normal breath sounds.  Musculoskeletal:     Cervical back: Neck supple.  Lymphadenopathy:     Cervical: No cervical adenopathy.  Neurological:     Mental Status: He is alert.      No results found for any visits on 10/11/23.  Last CBC Lab Results  Component Value Date   WBC 12.0 (H) 07/21/2023   HGB 11.6 (L) 07/21/2023   HCT 33.7 (L) 07/21/2023   MCV 95.5 07/21/2023   MCH 32.9 07/21/2023   RDW 12.4 07/21/2023   PLT 112 (L) 07/21/2023   Last metabolic panel Lab Results  Component Value Date   GLUCOSE 146 (H) 07/21/2023   NA 137 07/21/2023   K 3.9 07/21/2023   CL 105 07/21/2023   CO2 22 07/21/2023   BUN 14 07/21/2023   CREATININE 0.90 07/21/2023   GFRNONAA >60 07/21/2023   CALCIUM 9.0 07/21/2023   PROT 6.6 07/15/2023   ALBUMIN 3.6 07/15/2023   LABGLOB 2.5 09/07/2022   AGRATIO 1.8 09/07/2022   BILITOT 0.9 07/15/2023   ALKPHOS 84 07/15/2023   AST 35 07/15/2023   ALT 38 07/15/2023   ANIONGAP 10 07/21/2023   Last lipids Lab Results  Component Value Date   CHOL 94 04/12/2023   HDL 23.90 (L) 04/12/2023   LDLCALC 55 04/12/2023   TRIG 78.0 04/12/2023   CHOLHDL 4 04/12/2023      The ASCVD Risk score (Arnett DK, et al., 2019) failed to calculate for the following reasons:   The valid total cholesterol range is 130 to 320 mg/dL    Assessment & Plan:   78 year old male with  history of persistent hoarseness and some  pharyngoesophageal dysphagia to certain foods such as corn and rice. He is maintained currently on omeprazole 40 mg daily but has not seen any improvement in his hoarseness or swallowing difficulties.  Recent nasolaryngoscopy as above.  -Recommend continued diet modification with avoidance of late night eating -Continue omeprazole 40 mg daily for now -Set up modified barium swallow to further evaluate  Evelena Peat, MD

## 2023-10-12 ENCOUNTER — Other Ambulatory Visit (HOSPITAL_COMMUNITY): Payer: Self-pay | Admitting: Family Medicine

## 2023-10-12 DIAGNOSIS — R059 Cough, unspecified: Secondary | ICD-10-CM

## 2023-10-12 DIAGNOSIS — R1310 Dysphagia, unspecified: Secondary | ICD-10-CM

## 2023-10-20 ENCOUNTER — Telehealth: Payer: Self-pay

## 2023-10-20 NOTE — Telephone Encounter (Signed)
 Copied from CRM [id number]. Topic: General - Other >> Oct 20, 2023  4:28 PM Truddie Crumble wrote: Reason for CRM: patient wife called wanting to make sure that the approval with the insurance company went through for the patient swallowing test  CB [id number]

## 2023-10-22 ENCOUNTER — Ambulatory Visit (HOSPITAL_COMMUNITY)
Admission: RE | Admit: 2023-10-22 | Discharge: 2023-10-22 | Disposition: A | Discharge status: Home / Self Care | Source: Ambulatory Visit | Attending: Family Medicine | Admitting: Family Medicine

## 2023-10-22 DIAGNOSIS — R1310 Dysphagia, unspecified: Secondary | ICD-10-CM | POA: Insufficient documentation

## 2023-10-22 DIAGNOSIS — R059 Cough, unspecified: Secondary | ICD-10-CM

## 2023-10-22 DIAGNOSIS — R1314 Dysphagia, pharyngoesophageal phase: Secondary | ICD-10-CM

## 2023-10-30 DIAGNOSIS — G4733 Obstructive sleep apnea (adult) (pediatric): Secondary | ICD-10-CM | POA: Diagnosis not present

## 2023-11-30 ENCOUNTER — Ambulatory Visit: Payer: Self-pay

## 2023-11-30 DIAGNOSIS — G4733 Obstructive sleep apnea (adult) (pediatric): Secondary | ICD-10-CM | POA: Diagnosis not present

## 2023-11-30 NOTE — Telephone Encounter (Signed)
 Patient has appt on 4/30 1p

## 2023-11-30 NOTE — Telephone Encounter (Signed)
  Chief Complaint: Hypertension Symptoms: 167/74, 154/73-asymptomatic Frequency: last couple of days with higher readings Pertinent Negatives: Patient denies CP, SOB, headache, weakness Disposition: [] ED /[] Urgent Care (no appt availability in office) / [x] Appointment(In office/virtual)/ []  Day Virtual Care/ [] Home Care/ [] Refused Recommended Disposition /[] [place] [facility]/ []  Follow-up with PCP Additional Notes: patient with hx of high blood pressure calling this morning with concerns of high BP readings while on BP medication. Patient states the last couple of days that he has been having higher BP readings with systolic in the 160s. Patient reports taking BP medication without any issues. Denies CP, SOB, headache or weakness. Per protocol, patient is recommended to be seen within three days. Patient's wife is requesting for patient to be seen by tomorrow. Appointment scheduled for 12/01/2023 at 1:00 PM with PCP. Patient verbalized understanding of plan and all questions answered.    Copied from CRM [id number]. Topic: Clinical - Red Word Triage >> Nov 30, 2023  8:59 AM Deaijah H wrote: Red Word that prompted transfer to Nurse Triage: Issues with BP regulation. Running high. Currently high 167/74 Reason for Disposition  Systolic BP  >= 160 OR Diastolic >= 100  Answer Assessment - Initial Assessment Questions 1. BLOOD PRESSURE: "What is the blood pressure?" "Did you take at least two measurements 5 minutes apart?"     167/74   154/73 2. ONSET: "When did you take your blood pressure?"     This morning 3. HOW: "How did you take your blood pressure?" (e.g., automatic home BP monitor, visiting nurse)     Automatic home BP monitor 4. HISTORY: "Do you have a history of high blood pressure?"     Yes 5. MEDICINES: "Are you taking any medicines for blood pressure?" "Have you missed any doses recently?"    benazepril -hydrochlorthiazide (LOTENSIN  HCT) 20-25 6. OTHER SYMPTOMS: "Do you have  any symptoms?" (e.g., blurred vision, chest pain, difficulty breathing, headache, weakness)     No  Protocols used: Blood Pressure - High-A-AH

## 2023-12-01 ENCOUNTER — Ambulatory Visit (INDEPENDENT_AMBULATORY_CARE_PROVIDER_SITE_OTHER): Admitting: Family Medicine

## 2023-12-01 ENCOUNTER — Encounter: Payer: Self-pay | Admitting: Family Medicine

## 2023-12-01 VITALS — BP 150/80 | HR 72 | Temp 97.3°F | Wt 201.9 lb

## 2023-12-01 DIAGNOSIS — L720 Epidermal cyst: Secondary | ICD-10-CM

## 2023-12-01 DIAGNOSIS — I10 Essential (primary) hypertension: Secondary | ICD-10-CM | POA: Diagnosis not present

## 2023-12-01 MED ORDER — AMLODIPINE BESYLATE 5 MG PO TABS: 5.0000 mg | ORAL_TABLET | Freq: Every day | ORAL | 5 refills | Status: DC

## 2023-12-01 NOTE — Progress Notes (Signed)
 Established Patient Office Visit  Subjective   Patient ID: Kyle [name], male    DOB: 1946/01/01  Age: 78 y.o. MRN: [id number]  Chief Complaint  Patient presents with   Hypertension    HPI   Patient seen today with concerns for some recent elevated blood pressures.  He has upcoming visit to [place] and usually starts checking blood pressures couple weeks in preparation for that visit.  He has gotten several home readings 159-167 range systolic with diastolics usually 60s and 70s.  No headaches.  Generally feels well.  He is exercising several days per week with combination of walking about 2-1/2 miles followed by some resistance training.  Only drinks about 1-2 alcoholic drinks per week.  No recent dietary change.  Has been snacking some on high salt snacks.  He does have history of abdominal aortic aneurysm, hypertension, history of pulmonary embolus, COPD, struct of sleep apnea, hypothyroidism, hyperlipidemia.  Currently takes benazepril  HCTZ 1/2 tablet daily.  Compliant with therapy.  Also relates cystic lesion right side of neck.  His wife tried to puncture this with a needle couple times and expressed some whitish substance.  No fluctuance.  No associated pain.  Past Medical History:  Diagnosis Date   Anemia    CAD (coronary artery disease) 08/03/2009   Cardiac arrhythmia    COPD (chronic obstructive pulmonary disease) (HCC)    GERD (gastroesophageal reflux disease)    Hyperlipidemia    Hypertension    Hypothyroidism    Myocardial infarction (HCC) 08/03/2009   Sleep apnea treated with nocturnal BiPAP    Past Surgical History:  Procedure Laterality Date   ABDOMINAL AORTIC ENDOVASCULAR FENESTRATED STENT GRAFT N/A 07/20/2023   Procedure: ABDOMINAL AORTIC ENDOVASCULAR FENESTRATED STENT GRAFT;  Surgeon: Adine Hoof, MD;  Location: [facility] OR;  Service: Vascular;  Laterality: N/A;   APPENDECTOMY     CARDIAC CATHETERIZATION N/A 05/05/2016   Procedure: Left Heart Cath and  Coronary Angiography;  Surgeon: Hugh Madura, MD;  Location: [facility] LAB;  Service: Cardiovascular;  Laterality: N/A;   CORONARY ANGIOPLASTY WITH STENT PLACEMENT     EYE SURGERY     lasix /BIL   TONSILLECTOMY     ULTRASOUND GUIDANCE FOR VASCULAR ACCESS Bilateral 07/20/2023   Procedure: ULTRASOUND GUIDANCE FOR VASCULAR ACCESS, BILATERAL FEMORAL ARTERIES;  Surgeon: Adine Hoof, MD;  Location: [facility] OR;  Service: Vascular;  Laterality: Bilateral;    reports that he quit smoking about 29 years ago. His smoking use included cigarettes. He started smoking about 58 years ago. He has a 112 pack-year smoking history. He has never used smokeless tobacco. He reports current alcohol use of about 5.0 standard drinks of alcohol per week. He reports that he does not use drugs. family history includes Hypertension in his mother; Hypotension in his father; Multiple sclerosis in his sister; Stroke in his father and mother; Thyroid  disease in his brother. No Known Allergies  Review of Systems  Constitutional:  Negative for malaise/fatigue.  Eyes:  Negative for blurred vision.  Respiratory:  Negative for shortness of breath.   Cardiovascular:  Negative for chest pain.  Neurological:  Negative for dizziness, weakness and headaches.      Objective:     BP (!) 150/80 (BP Location: Left Arm, Patient Position: Sitting, Cuff Size: Normal)   Pulse 72   Temp (!) 97.3 F (36.3 C) (Oral)   Wt 201 lb 14.4 oz (91.6 kg)   SpO2 95%   BMI 28.16 kg/m  BP Readings from Last 3 Encounters:  12/01/23 (!) 150/80  10/11/23 130/66  08/25/23 131/80   Wt Readings from Last 3 Encounters:  12/01/23 201 lb 14.4 oz (91.6 kg)  10/11/23 202 lb (91.6 kg)  08/25/23 203 lb (92.1 kg)      Physical Exam Vitals reviewed.  Constitutional:      General: He is not in acute distress.    Appearance: He is well-developed. He is not ill-appearing.  HENT:     Right Ear: External ear normal.     Left Ear:  External ear normal.  Eyes:     Pupils: Pupils are equal, round, and reactive to light.  Neck:     Thyroid : No thyromegaly.  Cardiovascular:     Rate and Rhythm: Normal rate and regular rhythm.  Pulmonary:     Effort: Pulmonary effort is normal. No respiratory distress.     Breath sounds: Normal breath sounds. No wheezing or rales.  Musculoskeletal:     Cervical back: Neck supple.  Skin:    Comments: Right lower neck region reveals approximately one half by 1/2 cm minimally mobile nonfluctuant cystic lesion.  No overlying erythema.  Neurological:     Mental Status: He is alert and oriented to person, place, and time.      No results found for any visits on 12/01/23.    The ASCVD Risk score (Arnett DK, et al., 2019) failed to calculate for the following reasons:   The valid total cholesterol range is 130 to 320 mg/dL    Assessment & Plan:   #1 hypertension.  Poorly controlled currently on benazepril  HCTZ.  Discussed nonpharmacologic management with weight control, continued regular exercise, goal sodium intake 2000 to 2500 mg daily.  -Add amlodipine 5 mg once daily and set up 1 month follow-up to reassess - Continue close home monitoring  #2 epidermal cyst right lower neck region.  Reassurance.  Follow-up for any signs of secondary infection.  Glean Lamy, MD

## 2023-12-08 DIAGNOSIS — G4733 Obstructive sleep apnea (adult) (pediatric): Secondary | ICD-10-CM | POA: Diagnosis not present

## 2023-12-22 ENCOUNTER — Other Ambulatory Visit: Payer: Self-pay | Admitting: Gastroenterology

## 2023-12-29 ENCOUNTER — Ambulatory Visit: Admitting: Family Medicine

## 2023-12-30 DIAGNOSIS — G4733 Obstructive sleep apnea (adult) (pediatric): Secondary | ICD-10-CM | POA: Diagnosis not present

## 2023-12-31 ENCOUNTER — Ambulatory Visit: Admitting: Family Medicine

## 2024-01-03 ENCOUNTER — Ambulatory Visit (INDEPENDENT_AMBULATORY_CARE_PROVIDER_SITE_OTHER): Admitting: Family Medicine

## 2024-01-03 ENCOUNTER — Encounter: Payer: Self-pay | Admitting: Family Medicine

## 2024-01-03 VITALS — BP 138/62 | HR 76 | Temp 97.7°F | Wt 200.2 lb

## 2024-01-03 DIAGNOSIS — R739 Hyperglycemia, unspecified: Secondary | ICD-10-CM | POA: Insufficient documentation

## 2024-01-03 DIAGNOSIS — I10 Essential (primary) hypertension: Secondary | ICD-10-CM | POA: Diagnosis not present

## 2024-01-03 DIAGNOSIS — R7989 Other specified abnormal findings of blood chemistry: Secondary | ICD-10-CM

## 2024-01-03 LAB — T4, FREE: Free T4: 0.98 ng/dL (ref 0.60–1.60)

## 2024-01-03 NOTE — Progress Notes (Signed)
 Established Patient Office Visit  Subjective   Patient ID: Kyle [name], male    DOB: 1945-09-08  Age: 78 y.o. MRN: [id number]  Chief Complaint  Patient presents with   Medical Management of Chronic Issues    HPI   Patient is seen for follow-up regarding hypertension.  We recently added amlodipine  to his benazepril  HCTZ.  Tolerating well with no side effects.  He is made some very positive lifestyle changes this past year and is exercising 3 days/week regularly at the gym.  He did recently go to the [place] and had several labs done.  He brings in copy of those today.  He had cholesterol total of 96 with LDL of 60.  A1c 6.0%.  Liver panel was normal.  Did have low TSH of 0.008 with normal free T3 and normal free T4.  CBC unremarkable.  He previously took thyroid  replacement and was taken off this months ago.  Denies any overt symptoms of hyperthyroidism such as diarrhea, palpitations, tachycardia, weight loss, tremor.  No history of A-fib.  He has lost some weight this year intentionally due to dietary changes and exercise.  Past Medical History:  Diagnosis Date   Anemia    CAD (coronary artery disease) 08/03/2009   Cardiac arrhythmia    COPD (chronic obstructive pulmonary disease) (HCC)    GERD (gastroesophageal reflux disease)    Hyperlipidemia    Hypertension    Hypothyroidism    Myocardial infarction (HCC) 08/03/2009   Sleep apnea treated with nocturnal BiPAP    Past Surgical History:  Procedure Laterality Date   ABDOMINAL AORTIC ENDOVASCULAR FENESTRATED STENT GRAFT N/A 07/20/2023   Procedure: ABDOMINAL AORTIC ENDOVASCULAR FENESTRATED STENT GRAFT;  Surgeon: Kyle Hoof, MD;  Location: [facility] OR;  Service: Vascular;  Laterality: N/A;   APPENDECTOMY     CARDIAC CATHETERIZATION N/A 05/05/2016   Procedure: Left Heart Cath and Coronary Angiography;  Surgeon: Kyle Madura, MD;  Location: [facility] INVASIVE CV LAB;  Service: Cardiovascular;  Laterality: N/A;   CORONARY  ANGIOPLASTY WITH STENT PLACEMENT     EYE SURGERY     lasix /BIL   TONSILLECTOMY     ULTRASOUND GUIDANCE FOR VASCULAR ACCESS Bilateral 07/20/2023   Procedure: ULTRASOUND GUIDANCE FOR VASCULAR ACCESS, BILATERAL FEMORAL ARTERIES;  Surgeon: Kyle Hoof, MD;  Location: [facility] OR;  Service: Vascular;  Laterality: Bilateral;    reports that he quit smoking about 30 years ago. His smoking use included cigarettes. He started smoking about 58 years ago. He has a 112 pack-year smoking history. He has never used smokeless tobacco. He reports current alcohol use of about 5.0 standard drinks of alcohol per week. He reports that he does not use drugs. family history includes Hypertension in his mother; Hypotension in his father; Multiple sclerosis in his sister; Stroke in his father and mother; Thyroid  disease in his brother. No Known Allergies  Review of Systems  Constitutional:  Negative for chills, fever, malaise/fatigue and weight loss.  Eyes:  Negative for blurred vision.  Respiratory:  Negative for shortness of breath.   Cardiovascular:  Negative for chest pain.  Gastrointestinal:  Negative for abdominal pain.  Neurological:  Negative for dizziness, weakness and headaches.      Objective:     BP 138/62 (BP Location: Left Arm, Cuff Size: Normal)   Pulse 76   Temp 97.7 F (36.5 C) (Oral)   Wt 200 lb 3.2 oz (90.8 kg)   SpO2 95%   BMI 27.92 kg/m  BP Readings  from Last 3 Encounters:  01/03/24 138/62  12/01/23 (!) 150/80  10/11/23 130/66   Wt Readings from Last 3 Encounters:  01/03/24 200 lb 3.2 oz (90.8 kg)  12/01/23 201 lb 14.4 oz (91.6 kg)  10/11/23 202 lb (91.6 kg)      Physical Exam Vitals reviewed.  Constitutional:      Appearance: He is well-developed.  Eyes:     Pupils: Pupils are equal, round, and reactive to light.  Neck:     Thyroid : No thyromegaly.  Cardiovascular:     Rate and Rhythm: Normal rate and regular rhythm.  Pulmonary:     Effort: Pulmonary  effort is normal. No respiratory distress.     Breath sounds: Normal breath sounds. No wheezing or rales.  Musculoskeletal:     Cervical back: Neck supple.     Right lower leg: No edema.     Left lower leg: No edema.  Neurological:     Mental Status: He is alert and oriented to person, place, and time.      No results found for any visits on 01/03/24.  Last CBC Lab Results  Component Value Date   WBC 12.0 (H) 07/21/2023   HGB 11.6 (L) 07/21/2023   HCT 33.7 (L) 07/21/2023   MCV 95.5 07/21/2023   MCH 32.9 07/21/2023   RDW 12.4 07/21/2023   PLT 112 (L) 07/21/2023   Last metabolic panel Lab Results  Component Value Date   GLUCOSE 146 (H) 07/21/2023   NA 137 07/21/2023   K 3.9 07/21/2023   CL 105 07/21/2023   CO2 22 07/21/2023   BUN 14 07/21/2023   CREATININE 0.90 07/21/2023   GFRNONAA >60 07/21/2023   CALCIUM  9.0 07/21/2023   PROT 6.6 07/15/2023   ALBUMIN 3.6 07/15/2023   LABGLOB 2.5 09/07/2022   AGRATIO 1.8 09/07/2022   BILITOT 0.9 07/15/2023   ALKPHOS 84 07/15/2023   AST 35 07/15/2023   ALT 38 07/15/2023   ANIONGAP 10 07/21/2023   Last lipids Lab Results  Component Value Date   CHOL 94 04/12/2023   HDL 23.90 (L) 04/12/2023   LDLCALC 55 04/12/2023   TRIG 78.0 04/12/2023   CHOLHDL 4 04/12/2023   Last hemoglobin A1c Lab Results  Component Value Date   HGBA1C 7.1 (H) 11/04/2020   Last thyroid  functions Lab Results  Component Value Date   TSH 0.00 (L) 06/14/2023      The ASCVD Risk score (Arnett DK, et al., 2019) failed to calculate for the following reasons:   The valid total cholesterol range is 130 to 320 mg/dL    Assessment & Plan:   #1 hypertension improved following recent addition of amlodipine  to benazepril  HCTZ.  Continue current regimen.  Continue monitoring at home.  Continue low-sodium diet and regular exercise habits  #2 history of recent low TSH at [facility] clinic.  Normal free T4 and T3.  Recheck labs today with repeat TSH along with  thyroid  antibody studies.  Reviewed signs and symptoms to watch out for for hyperthyroidism.  Consider possible endocrinology referral based on results.  #3 history of type 2 diabetes.  A1[facility] greatly improved with [facility] positive recent [facility] changes  Return in about 3 months (around 04/04/2024).    Glean Lamy, MD

## 2024-01-14 ENCOUNTER — Ambulatory Visit: Payer: Self-pay

## 2024-01-14 DIAGNOSIS — E039 Hypothyroidism, unspecified: Secondary | ICD-10-CM

## 2024-01-16 LAB — TSH+T3+THYABS+TPO AB+TRAB+T...
Anti-Thyroglobulin Antibodies: 8.4 [IU]/mL — ABNORMAL HIGH
Anti-Thyroid Peroxidase Ab: 22 [IU]/mL — ABNORMAL HIGH
Free T-3: 4.4 pg/mL
Free T4 by Dialysis: 1.3 ng/dL
Reverse T3,  LCMS Endo Sci: 18 ng/dL
TSH Receptor Antibody (TBII): 9.2 U/L
TSH: <0.01 uU/mL — ABNORMAL LOW
Triiodothyronine (T-3), Serum: 140 ng/dL

## 2024-01-17 NOTE — Addendum Note (Signed)
 Addended by: Aurelio Leer on: 01/17/2024 08:47 AM   Modules accepted: Orders

## 2024-01-19 ENCOUNTER — Ambulatory Visit: Admitting: Cardiology

## 2024-01-28 ENCOUNTER — Ambulatory Visit (INDEPENDENT_AMBULATORY_CARE_PROVIDER_SITE_OTHER): Admitting: Family Medicine

## 2024-01-28 ENCOUNTER — Encounter: Payer: Self-pay | Admitting: Family Medicine

## 2024-01-28 VITALS — BP 150/72 | HR 81 | Temp 97.9°F | Wt 203.8 lb

## 2024-01-28 DIAGNOSIS — R7989 Other specified abnormal findings of blood chemistry: Secondary | ICD-10-CM

## 2024-01-28 DIAGNOSIS — R600 Localized edema: Secondary | ICD-10-CM | POA: Diagnosis not present

## 2024-01-28 NOTE — Progress Notes (Unsigned)
 Established Patient Office Visit  Subjective   Patient ID: Kyle [name], male    DOB: [birth year]  Age: 78 y.o. MRN: [id number]  Chief Complaint  Patient presents with   Edema    HPI  {History (Optional):23778} Kyle [name] is seen today with some asymmetric edema left lower extremity greater than right.  He states he has noticed some edema around the ankle and foot and lower leg for about 2 weeks.  No calf pain.  No thigh pain.  He has chronic dyspnea related to COPD which is unchanged.  No pleuritic pain or chest pain.  Does take low-dose amlodipine .  He does have prior history of PE noted on imaging 9/24.  Doppler ultrasound at that time showed no evidence for DVT.  His edema does go down some at night.  Does not go down completely though.  In addition to amlodipine  he takes benazepril  HCTZ for blood pressure.  Denies any increased dyspnea above baseline.  No chest pain.  Recent abnormal labs.  He had TSH less than 0.01 with elevated antithyroid peroxidase antibody and antithyroglobulin antibodies We have placed endocrinology referral and he has not heard back yet.  He denies any tachypalpitations.  He has had some intentional weight loss over recent months but weight stable since last visit.  Past Medical History:  Diagnosis Date   Anemia    CAD (coronary artery disease) 08/03/2009   Cardiac arrhythmia    COPD (chronic obstructive pulmonary disease) (HCC)    GERD (gastroesophageal reflux disease)    Hyperlipidemia    Hypertension    Hypothyroidism    Myocardial infarction (HCC) 08/03/2009   Sleep apnea treated with nocturnal BiPAP    Past Surgical History:  Procedure Laterality Date   ABDOMINAL AORTIC ENDOVASCULAR FENESTRATED STENT GRAFT N/A 07/20/2023   Procedure: ABDOMINAL AORTIC ENDOVASCULAR FENESTRATED STENT GRAFT;  Surgeon: [name], MD;  Location: [facility] OR;  Service: Vascular;  Laterality: N/A;   APPENDECTOMY     CARDIAC CATHETERIZATION N/A 05/05/2016    Procedure: Left Heart Cath and Coronary Angiography;  Surgeon: Oneil JAYSON Parchment, MD;  Location: [facility];  Service: Cardiovascular;  Laterality: N/A;   CORONARY ANGIOPLASTY WITH STENT PLACEMENT     EYE SURGERY     lasix /BIL   TONSILLECTOMY     ULTRASOUND GUIDANCE FOR VASCULAR ACCESS Bilateral 07/20/2023   Procedure: ULTRASOUND GUIDANCE FOR VASCULAR ACCESS, BILATERAL FEMORAL ARTERIES;  Surgeon: [name], MD;  Location: [facility] OR;  Service: Vascular;  Laterality: Bilateral;    reports that he quit smoking about 30 years ago. His smoking use included cigarettes. He started smoking about 58 years ago. He has a 112 pack-year smoking history. He has never used smokeless tobacco. He reports current alcohol use of about 5.0 standard drinks of alcohol per week. He reports that he does not use drugs. family history includes Hypertension in his mother; Hypotension in his father; Multiple sclerosis in his sister; Stroke in his father and mother; Thyroid  disease in his brother. No Known Allergies    Review of Systems  Constitutional:  Negative for fever and malaise/fatigue.  Eyes:  Negative for blurred vision.  Respiratory:  Negative for shortness of breath.   Cardiovascular:  Positive for leg swelling. Negative for chest pain, palpitations, orthopnea and claudication.  Neurological:  Negative for dizziness, weakness and headaches.      Objective:     BP (!) 150/72 (BP Location: Left Arm, Patient Position: Sitting, Cuff Size: Normal)  Pulse 81   Temp 97.9 F (36.6 C) (Oral)   Wt 203 lb 12.8 oz (92.4 kg)   SpO2 95%   BMI 28.42 kg/m  BP Readings from Last 3 Encounters:  01/28/24 (!) 150/72  01/03/24 138/62  12/01/23 (!) 150/80   Wt Readings from Last 3 Encounters:  01/28/24 203 lb 12.8 oz (92.4 kg)  01/03/24 200 lb 3.2 oz (90.8 kg)  12/01/23 201 lb 14.4 oz (91.6 kg)      Physical Exam Vitals reviewed.  Constitutional:      Appearance: He is well-developed.   HENT:     Right Ear: External ear normal.     Left Ear: External ear normal.   Eyes:     Pupils: Pupils are equal, round, and reactive to light.   Neck:     Thyroid : No thyromegaly.   Cardiovascular:     Rate and Rhythm: Normal rate and regular rhythm.  Pulmonary:     Effort: Pulmonary effort is normal. No respiratory distress.     Breath sounds: Normal breath sounds. No wheezing or rales.   Musculoskeletal:     Cervical back: Neck supple.     Comments: He has some very mild pitting edema left foot ankle and lower leg and essentially not on the right.  No calf tenderness on the left.  No visible color changes.  Left foot is warm to touch with excellent capillary refill good distal pulses.   Neurological:     Mental Status: He is alert and oriented to person, place, and time.      No results found for any visits on 01/28/24.  {Labs (Optional):23779}  The ASCVD Risk score (Arnett DK, et al., 2019) failed to calculate for the following reasons:   Risk score cannot be calculated because patient has a medical history suggesting prior/existing ASCVD    Assessment & Plan:   #1 asymmetric edema left lower extremity greater than right.  Fairly low clinical suspicion for DVT though he has had prior history of PE.  Suspect probably more likely venous stasis related.  Will check venous Dopplers to be safe.  If negative, focus on elevation and compression.  #2 hyperthyroidism by recent labs.  Patient is stable at this time.  No tachyarrhythmia or other worrisome findings.  Endocrinology consult pending   No follow-ups on file.    Wolm Scarlet, MD

## 2024-01-30 DIAGNOSIS — G4733 Obstructive sleep apnea (adult) (pediatric): Secondary | ICD-10-CM | POA: Diagnosis not present

## 2024-02-01 ENCOUNTER — Ambulatory Visit (HOSPITAL_COMMUNITY)
Admission: RE | Admit: 2024-02-01 | Discharge: 2024-02-01 | Disposition: A | Discharge status: Home / Self Care | Source: Ambulatory Visit | Attending: Family Medicine | Admitting: Family Medicine

## 2024-02-01 ENCOUNTER — Ambulatory Visit: Payer: Self-pay | Admitting: Family Medicine

## 2024-02-01 DIAGNOSIS — R600 Localized edema: Secondary | ICD-10-CM

## 2024-02-17 ENCOUNTER — Ambulatory Visit: Admitting: Family Medicine

## 2024-02-29 DIAGNOSIS — G4733 Obstructive sleep apnea (adult) (pediatric): Secondary | ICD-10-CM | POA: Diagnosis not present

## 2024-03-01 ENCOUNTER — Ambulatory Visit: Attending: Cardiology | Admitting: Cardiology

## 2024-03-01 ENCOUNTER — Encounter: Payer: Self-pay | Admitting: Cardiology

## 2024-03-01 VITALS — BP 130/64 | HR 78 | Ht 71.0 in | Wt 199.6 lb

## 2024-03-01 DIAGNOSIS — I10 Essential (primary) hypertension: Secondary | ICD-10-CM | POA: Diagnosis not present

## 2024-03-01 DIAGNOSIS — I7143 Infrarenal abdominal aortic aneurysm, without rupture: Secondary | ICD-10-CM | POA: Diagnosis not present

## 2024-03-01 DIAGNOSIS — E7800 Pure hypercholesterolemia, unspecified: Secondary | ICD-10-CM | POA: Diagnosis not present

## 2024-03-01 DIAGNOSIS — E785 Hyperlipidemia, unspecified: Secondary | ICD-10-CM

## 2024-03-01 DIAGNOSIS — I2510 Atherosclerotic heart disease of native coronary artery without angina pectoris: Secondary | ICD-10-CM | POA: Diagnosis not present

## 2024-03-01 DIAGNOSIS — R0609 Other forms of dyspnea: Secondary | ICD-10-CM | POA: Diagnosis not present

## 2024-03-01 NOTE — Patient Instructions (Signed)
  Lab Work IN 1 MONTH: Fasting lipid panel  LFT  If you have labs (blood work) drawn today and your tests are completely normal, you will receive your results only by: MyChart Message (if you have MyChart) OR A paper copy in the mail If you have any lab test that is abnormal or we need to change your treatment, we will call you to review the results.   Follow-Up: At [facility], you and your health needs are our priority.  As part of our continuing mission to provide you with exceptional heart care, our providers are all part of one team.  This team includes your primary Cardiologist (physician) and Advanced Practice Providers or APPs (Physician Assistants and Nurse Practitioners) who all work together to provide you with the care you need, when you need it.  Your next appointment:   6 month(s)  Provider:   Oneil Parchment, MD

## 2024-03-01 NOTE — Progress Notes (Signed)
 Cardiology Office Note:   Date:  03/01/2024  ID:  Kinte, Trim [birth year], MRN [id number] PCP: Micheal Wolm ORN, MD  [facility] Providers Cardiologist:  Oneil Parchment, MD    History of Present Illness:   Discussed the use of AI scribe software for clinical note transcription with the patient, who gave verbal consent to proceed.  History of Present Illness Kyle [name] is a 78 year old male with coronary artery disease, COPD, and peripheral artery disease who presents for a six-month follow-up.  He has a history of coronary artery disease with a myocardial infarction and ST elevation in 2011, treated with stenting of the right coronary artery. A repeat heart catheterization in 2017 showed a stable and patent stent. He has no current chest pain. His last cholesterol check in September 2024 showed an LDL of 55 and a total cholesterol of 94.  He has COPD and avoids walking outside but regularly attends the gym. He does not use the treadmill but works out with machines, focusing on repetitions rather than heavy weights. He feels great at the gym and can walk up to two and a half miles on a good day. He uses Trelegy for COPD management and occasionally uses albuterol , though there are weeks he does not use it at all. He experiences shortness of breath with exertion but manages by resting and resuming activities.  He was admitted in September 2024 for a pulmonary embolism in the segmental branch of the middle right lobe artery, with no DVT found. He was on Eliquis  but is now off it. He reports swelling in his feet, especially when sitting for long periods, but a recent DVT study showed no obstruction.  He underwent a fenestrated endograft for a large abdominal aortic aneurysm and reports no significant pain post-surgery, except for minor discomfort at the incision sites. He has resumed moderate gym activities and has lost weight, now weighing below 200 pounds from a previous  weight of 230 pounds.  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 This does not bother him but is embarrassing in social settings.  He takes amlodipine  5 mg and a combination of benazepril  and hydrochlorothiazide  for hypertension.   Today patient denies chest pain, shortness of breath, lower extremity edema, fatigue, palpitations, melena, hematuria, hemoptysis, diaphoresis, weakness, presyncope, syncope, orthopnea, and PND.   Studies Reviewed:    EKG:          Risk Assessment/Calculations:              Physical Exam:   VS:  BP 130/64   Pulse 78   Ht 5' 11 (1.803 m)   Wt 199 lb 9.6 oz (90.5 kg)   SpO2 91%   BMI 27.84 kg/m    Wt Readings from Last 3 Encounters:  03/01/24 199 lb 9.6 oz (90.5 kg)  01/28/24 203 lb 12.8 oz (92.4 kg)  01/03/24 200 lb 3.2 oz (90.8 kg)     Physical Exam Vitals reviewed.  Constitutional:      Appearance: Normal appearance.  HENT:     Head: Normocephalic.  Eyes:     Pupils: Pupils are equal, round, and reactive to light.  Neck:     Vascular: No carotid bruit.  Cardiovascular:     Rate and Rhythm: Normal rate and regular rhythm.     Pulses: Normal pulses.     Heart sounds: Normal  heart sounds.  Pulmonary:     Effort: Pulmonary effort is normal.     Breath sounds: Normal breath sounds.     Comments: Slightly distant w/ COPD Abdominal:     General: Abdomen is flat.     Palpations: Abdomen is soft.  Musculoskeletal:     Right lower leg: No edema.     Left lower leg: No edema.  Skin:    General: Skin is warm and dry.     Capillary Refill: Capillary refill takes less than 2 seconds.  Neurological:     General: No focal deficit present.     Mental Status: He is alert and oriented to person, place, and time.  Psychiatric:        Mood and Affect: Mood normal.        Behavior: Behavior normal.         Thought Content: Thought content normal.        Judgment: Judgment normal.      ASSESSMENT AND PLAN:    Assessment & Plan Coronary artery disease, status post stent placement Coronary artery disease with history of ST elevation myocardial infarction in 2011 and prior stenting to the right coronary artery. Repeat heart catheterization in 2017 showed patent stent. No current chest pain reported. - Continue aspirin  therapy - Continue atorvastatin  as cholesterol levels are at goal - Recheck cholesterol levels in September or October  Chronic obstructive pulmonary disease (COPD) COPD with no recent changes in inhaler regimen. Continues to use Trelegy and albuterol  as needed. Reports exertional dyspnea but manages symptoms by pacing activities. Discussed the high cost of Trelegy and the lack of generic alternatives. - Continue Trelegy per pulmonolgy - Use albuterol  as needed - Encourage pacing of activities to manage exertional dyspnea  Status post fenestrated endograft for abdominal aortic aneurysm Status post fenestrated endograft for large abdominal aortic aneurysm. No complications reported post-surgery. Resumed moderate physical activity without issues. No weight restrictions from vascular surgery team.  History of pulmonary embolism (segmental right middle lobe branch) Isolated occlusive pulmonary embolism in September 2024 in the segmental branch of the right middle lobe artery. No DVT found in workup. Currently off anticoagulation therapy (Eliquis ). Recent DVT study showed no obstruction despite swelling in legs. - Monitor for signs of DVT, especially with leg swelling  Hypertension Hypertension well-controlled with current medication regimen. Blood pressure today was 130/64, consistent with historical readings. - Continue amlodipine  5 mg daily - Continue benazepril /hydrochlorothiazide  combination therapy  Hyperlipidemia Hyperlipidemia well-managed with atorvastatin . Last  cholesterol check in September showed LDL at 30, which is at goal for patients with coronary artery disease. - Continue atorvastatin  40mg  - Recheck cholesterol levels in September or October  6 month follow up with Dr. Jeffrie.           Signed, Artist Pouch, PA-C

## 2024-03-09 DIAGNOSIS — G4733 Obstructive sleep apnea (adult) (pediatric): Secondary | ICD-10-CM | POA: Diagnosis not present

## 2024-03-10 ENCOUNTER — Encounter: Payer: Self-pay | Admitting: Family Medicine

## 2024-03-13 ENCOUNTER — Encounter: Payer: Self-pay | Admitting: Cardiology

## 2024-03-18 ENCOUNTER — Other Ambulatory Visit: Payer: Self-pay | Admitting: Cardiology

## 2024-03-29 ENCOUNTER — Encounter: Payer: Self-pay | Admitting: Internal Medicine

## 2024-03-29 ENCOUNTER — Ambulatory Visit (INDEPENDENT_AMBULATORY_CARE_PROVIDER_SITE_OTHER): Admitting: Internal Medicine

## 2024-03-29 VITALS — BP 148/62 | HR 100 | Temp 97.6°F | Wt 197.3 lb

## 2024-03-29 DIAGNOSIS — R1031 Right lower quadrant pain: Secondary | ICD-10-CM | POA: Diagnosis not present

## 2024-03-29 NOTE — Progress Notes (Signed)
 Established Patient Office Visit     CC/Reason for Visit: Right groin pain  HPI: Kyle [name] is a 78 y.o. male who is coming in today for the above mentioned reasons.  Last week he had an episode of right groin pain that lasted less than 12 hours.  It is no longer bothering him.  No urinary discomfort, no flank pain, no recent heavy lifting.   Past Medical/Surgical History: Past Medical History:  Diagnosis Date   Anemia    CAD (coronary artery disease) 08/03/2009   Cardiac arrhythmia    COPD (chronic obstructive pulmonary disease) (HCC)    GERD (gastroesophageal reflux disease)    Hyperlipidemia    Hypertension    Hypothyroidism    Myocardial infarction (HCC) 08/03/2009   Sleep apnea treated with nocturnal BiPAP     Past Surgical History:  Procedure Laterality Date   ABDOMINAL AORTIC ENDOVASCULAR FENESTRATED STENT GRAFT N/A 07/20/2023   Procedure: ABDOMINAL AORTIC ENDOVASCULAR FENESTRATED STENT GRAFT;  Surgeon: [name], MD;  Location: [facility] OR;  Service: Vascular;  Laterality: N/A;   APPENDECTOMY     CARDIAC CATHETERIZATION N/A 05/05/2016   Procedure: Left Heart Cath and Coronary Angiography;  Surgeon: Oneil JAYSON Parchment, MD;  Location: [facility] INV[facility] LAB;  Service: Cardiovascular;  Laterality: N/A;   CORONARY ANGIOPLASTY WITH STENT PLACEMENT     EYE SURGERY     lasix /BIL   TONSILLECTOMY     ULTRASOUND GUIDANCE FOR VASCULAR ACCESS Bilateral 07/20/2023   Procedure: ULTRASOUND GUIDANCE FOR VASCULAR ACCESS, BILATERAL FEMORAL ARTERIES;  Surgeon: [name], MD;  Location: [facility] OR;  Service: Vascular;  Laterality: Bilateral;    Social History:  reports that he quit smoking about 30 years ago. His smoking use included cigarettes. He started smoking about 58 years ago. He has a 112 pack-year smoking history. He has never used smokeless tobacco. He reports current alcohol use of about 5.0 standard drinks of alcohol per week. He reports that he does  not use drugs.  Allergies: No Known Allergies  Family History:  Family History  Problem Relation Age of Onset   Stroke Mother    Hypertension Mother    Stroke Father    Hypotension Father    Multiple sclerosis Sister    Thyroid  disease Brother      Current Outpatient Medications:    albuterol  (VENTOLIN  HFA) 108 (90 Base) MCG/ACT inhaler, Inhale 2 puffs into the lungs every 6 (six) hours as needed for wheezing or shortness of breath., Disp: 6.7 g, Rfl: 11   amLODipine  (NORVASC ) 5 MG tablet, Take 1 tablet (5 mg total) by mouth daily., Disp: 30 tablet, Rfl: 5   aspirin  EC 81 MG tablet, Take 1 tablet (81 mg total) by mouth daily at 6 (six) AM. Swallow whole., Disp: 30 tablet, Rfl: 12   atorvastatin  (LIPITOR) 40 MG tablet, TAKE ONE TABLET BY MOUTH EVERY EVENING AT 06:00PM, Disp: 90 tablet, Rfl: 3   benazepril -hydrochlorthiazide (LOTENSIN  HCT) 20-25 MG tablet, TAKE ONE-HALF TABLET BY MOUTH ONE TIME DAILY, Disp: 45 tablet, Rfl: 3   cetirizine (ZYRTEC) 10 MG tablet, Take 10 mg by mouth daily., Disp: , Rfl:    Cyanocobalamin  (B-12 PO), Take 1 tablet by mouth every other day., Disp: , Rfl:    Fluticasone -Umeclidin-Vilant (TRELEGY ELLIPTA ) 100-62.5-25 MCG/ACT AEPB, Inhale 1 puff into the lungs daily., Disp: 60 each, Rfl: 11   Multiple Vitamin (MULTIVITAMIN) tablet, Take 1 tablet by mouth daily., Disp: , Rfl:  NON FORMULARY, Pt uses bi-pap nightly, Disp: , Rfl:    omeprazole  (PRILOSEC) 40 MG capsule, Take 1 capsule (40 mg total) by mouth daily. Take by mouth., Disp: 30 capsule, Rfl: 11   valACYclovir  (VALTREX ) 1000 MG tablet, Take 1 g by mouth daily., Disp: , Rfl:    ferrous sulfate  (FEROSUL) 325 (65 FE) MG tablet, TAKE ONE TABLET BY MOUTH ONE TIME DAILY WITH BREAKFAST, Disp: 90 tablet, Rfl: 1  Review of Systems:  Negative unless indicated in HPI.   Physical Exam: Vitals:   03/29/24 1348 03/29/24 1351  BP: (!) 160/80 (!) 148/62  Pulse: 100   Temp: 97.6 F (36.4 C)   TempSrc: Oral    SpO2: 96%   Weight: 197 lb 4.8 oz (89.5 kg)     Body mass index is 27.52 kg/m.    Impression and Plan:  Right groin pain  -Unclear etiology, unlikely an inguinal hernia as none palpated on exam today.  Observation for now.   Time spent:20 minutes reviewing chart, interviewing and examining patient and formulating plan of care.     Tully Theophilus Andrews, MD [facility]

## 2024-04-04 DIAGNOSIS — E7800 Pure hypercholesterolemia, unspecified: Secondary | ICD-10-CM | POA: Diagnosis not present

## 2024-04-05 ENCOUNTER — Ambulatory Visit: Payer: Self-pay | Admitting: Cardiology

## 2024-04-05 LAB — HEPATIC FUNCTION PANEL
ALT: 39 IU/L (ref 0–44)
AST: 31 IU/L (ref 0–40)
Albumin: 4.3 g/dL (ref 3.8–4.8)
Alkaline Phosphatase: 108 IU/L (ref 44–121)
Bilirubin Total: 0.6 mg/dL (ref 0.0–1.2)
Bilirubin, Direct: 0.25 mg/dL (ref 0.00–0.40)
Total Protein: 6.8 g/dL (ref 6.0–8.5)

## 2024-04-05 LAB — LIPID PANEL
Chol/HDL Ratio: 3.9 ratio (ref 0.0–5.0)
Cholesterol, Total: 97 mg/dL — ABNORMAL LOW (ref 100–199)
HDL: 25 mg/dL — ABNORMAL LOW (ref >39)
LDL Chol Calc (NIH): 53 mg/dL (ref 0–99)
Triglycerides: 97 mg/dL (ref 0–149)
VLDL Cholesterol Cal: 19 mg/dL (ref 5–40)

## 2024-04-13 ENCOUNTER — Ambulatory Visit: Payer: Self-pay | Admitting: Endocrinology

## 2024-04-13 ENCOUNTER — Encounter: Payer: Self-pay | Admitting: Endocrinology

## 2024-04-13 VITALS — BP 146/60 | HR 74 | Resp 20 | Ht 71.0 in | Wt 210.4 lb

## 2024-04-13 DIAGNOSIS — E0590 Thyrotoxicosis, unspecified without thyrotoxic crisis or storm: Secondary | ICD-10-CM

## 2024-04-13 NOTE — Progress Notes (Signed)
 Outpatient Endocrinology Note Kyle Hetal Proano, MD   Patient's Name: Kyle [name]    DOB: 08/19/1945    MRN: [id number]  REASON OF VISIT: New consult for hyperthyroidism  REFERRING PROVIDER:   PCP: Micheal Wolm ORN, MD  HISTORY OF PRESENT ILLNESS:   Kyle [name] is a 78 y.o. old male with past medical history as listed below is presented for new consult for hyperthyroidism.   Pertinent Thyroid  History: Patient is referred to endocrinology for further evaluation and management of thyroid  disorder/hypothyroidism.  Initial consult on April 13, 2024.  Patient has history of hypothyroidism was on thyroid  hormone replacement levothyroxine  25 to 75 mcg dose in the past.  Levothyroxine  was gradually decreased over time, with the lab results indicating hypothyroidism, and was completely stopped in November 2024.  He had TSH as high as 8.39.  With off of levothyroxine  he continued to have low TSH, lab in January 13, 2024 suppressed TSH with normal free T3, normal free T4, total T3 normal.  Antibody test at that time with mildly elevated antithyroglobulin and antithyroid peroxidase antibody and also elevated TSH receptor antibody TBII.  Patient has not been on thyroid  medication.  He denies any illness around the time of laboratory test.  No recent IV iodine  contrast use.  No amiodarone use.  Family history of thyroid  disorder in brother.  He sometimes feels something stuck in the throat otherwise no neck related symptoms.  Denies palpitation or heat intolerance.  Denies extra sweating.  He lost about 40 pounds of weight in last 1 year he reports intentional with improvement on diet and regular exercise.  Denies change in bowel habit.  Labs:   Latest Reference Range & Units 04/12/23 11:26 06/14/23 07:51 01/03/24 12:07  TSH 0.35 - 5.50 uIU/mL 0.00 Repeated and verified X2. (L) 0.00 (L)   TSH uU/mL   <0.01 (L)  Triiodothyronine (T-3), Serum ng/dL   859  Free T-3 pg/mL   4.4   T4,Free(Direct) 0.60 - 1.60 ng/dL   9.01  Anti-Thyroglobulin Antibodies IU/mL   8.4 (H)  Free T4 by Dialysis ng/dL   1.3  Anti-Thyroid  Peroxidase Ab IU/mL   22 (H)  Reverse T3,  LCMS Endo Sci ng/dL   81.9  TSH Receptor Antibody (TBII) U/L   9.2  (L): Data is abnormally low (H): Data is abnormally high  REVIEW OF SYSTEMS:  As per history of present illness.   PAST MEDICAL HISTORY: Past Medical History:  Diagnosis Date   Anemia    CAD (coronary artery disease) 08/03/2009   Cardiac arrhythmia    COPD (chronic obstructive pulmonary disease) (HCC)    GERD (gastroesophageal reflux disease)    Hyperlipidemia    Hypertension    Hypothyroidism    Myocardial infarction (HCC) 08/03/2009   Sleep apnea treated with nocturnal BiPAP     PAST SURGICAL HISTORY: Past Surgical History:  Procedure Laterality Date   ABDOMINAL AORTIC ENDOVASCULAR FENESTRATED STENT GRAFT N/A 07/20/2023   Procedure: ABDOMINAL AORTIC ENDOVASCULAR FENESTRATED STENT GRAFT;  Surgeon: [name], MD;  Location: [facility] OR;  Service: Vascular;  Laterality: N/A;   APPENDECTOMY     CARDIAC CATHETERIZATION N/A 05/05/2016   Procedure: Left Heart Cath and Coronary Angiography;  Surgeon: Oneil JAYSON Parchment, MD;  Location: [facility] INVASIVE CV LAB;  Service: Cardiovascular;  Laterality: N/A;   CORONARY ANGIOPLASTY WITH STENT PLACEMENT     EYE SURGERY     lasix /BIL   TONSILLECTOMY     ULTRASOUND GUIDANCE FOR VASCULAR  ACCESS Bilateral 07/20/2023   Procedure: ULTRASOUND GUIDANCE FOR VASCULAR ACCESS, BILATERAL FEMORAL ARTERIES;  Surgeon: [name], MD;  Location: [facility] OR;  Service: Vascular;  Laterality: Bilateral;    ALLERGIES: No Known Allergies  FAMILY HISTORY:  Family History  Problem Relation Age of Onset   Stroke Mother    Hypertension Mother    Stroke Father    Hypotension Father    Multiple sclerosis Sister    Thyroid  disease Brother     SOCIAL HISTORY: Social History   Socioeconomic  History   Marital status: Married    Spouse name: Not on file   Number of children: 0   Years of education: Not on file   Highest education level: 12th grade  Occupational History   Occupation: retired  Tobacco Use   Smoking status: Former    Current packs/day: 0.00    Average packs/day: 4.0 packs/day for 28.0 years (112.0 ttl pk-yrs)    Types: Cigarettes    Start date: 12/10/1965    Quit date: 12/10/1993    Years since quitting: 30.3   Smokeless tobacco: Never  Vaping Use   Vaping status: Never Used  Substance and Sexual Activity   Alcohol use: Yes    Alcohol/week: 5.0 standard drinks of alcohol    Types: 5 Standard drinks or equivalent per week    Comment: once every 2 months   Drug use: No   Sexual activity: Not on file  Other Topics Concern   Not on file  Social History Narrative   Not on file   Social Drivers of Health   Financial Resource Strain: Low Risk  (01/27/2024)   Overall Financial Resource Strain (CARDIA)    Difficulty of Paying Living Expenses: Not hard at all  Food Insecurity: No Food Insecurity (01/27/2024)   Hunger Vital Sign    Worried About Running Out of Food in the Last Year: Never true    Ran Out of Food in the Last Year: Never true  Transportation Needs: No Transportation Needs (01/27/2024)   PRAPARE - Administrator, Civil Service (Medical): No    Lack of Transportation (Non-Medical): No  Physical Activity: Unknown (01/27/2024)   Exercise Vital Sign    Days of Exercise per Week: 3 days    Minutes of Exercise per Session: Not on file  Stress: No Stress Concern Present (01/27/2024)   [employer] of Occupational Health - Occupational Stress Questionnaire    Feeling of Stress: Not at all  Social Connections: Socially Integrated (01/27/2024)   Social Connection and Isolation Panel    Frequency of Communication with Friends and Family: Three times a week    Frequency of Social Gatherings with Friends and Family: Once a week    Attends  Religious Services: More than 4 times per year    Active Member of [employer] or Organizations: Yes    Attends Engineer, structural: More than 4 times per year    Marital Status: Married    MEDICATIONS:  Current Outpatient Medications  Medication Sig Dispense Refill   albuterol  (VENTOLIN  HFA) 108 (90 Base) MCG/ACT inhaler Inhale 2 puffs into the lungs every 6 (six) hours as needed for wheezing or shortness of breath. 6.7 g 11   amLODipine  (NORVASC ) 5 MG tablet Take 1 tablet (5 mg total) by mouth daily. 30 tablet 5   aspirin  EC 81 MG tablet Take 1 tablet (81 mg total) by mouth daily at 6 (six) AM. Swallow whole. 30 tablet 12  atorvastatin  (LIPITOR) 40 MG tablet TAKE ONE TABLET BY MOUTH EVERY EVENING AT 06:00PM 90 tablet 3   benazepril -hydrochlorthiazide (LOTENSIN  HCT) 20-25 MG tablet TAKE ONE-HALF TABLET BY MOUTH ONE TIME DAILY 45 tablet 3   cetirizine (ZYRTEC) 10 MG tablet Take 10 mg by mouth daily.     Cyanocobalamin  (B-12 PO) Take 1 tablet by mouth every other day.     Fluticasone -Umeclidin-Vilant (TRELEGY ELLIPTA ) 100-62.5-25 MCG/ACT AEPB Inhale 1 puff into the lungs daily. 60 each 11   Multiple Vitamin (MULTIVITAMIN) tablet Take 1 tablet by mouth daily.     NON FORMULARY Pt uses bi-pap nightly     omeprazole  (PRILOSEC) 40 MG capsule Take 1 capsule (40 mg total) by mouth daily. Take by mouth. 30 capsule 11   valACYclovir  (VALTREX ) 1000 MG tablet Take 1 g by mouth daily.     No current facility-administered medications for this visit.    PHYSICAL EXAM: Vitals:   04/13/24 1000 04/13/24 1001  BP: (!) 144/60 (!) 146/60  Pulse: 74   Resp: 20   SpO2: 96%   Weight: 210 lb 6.4 oz (95.4 kg)   Height: 5' 11 (1.803 m)    Body mass index is 29.34 kg/m.  Wt Readings from Last 3 Encounters:  04/13/24 210 lb 6.4 oz (95.4 kg)  03/29/24 197 lb 4.8 oz (89.5 kg)  03/01/24 199 lb 9.6 oz (90.5 kg)    General: Well developed, well nourished male in no apparent distress.  HEENT: AT/[place],  no external lesions. Hearing intact to the spoken word Eyes: EOMI. No stare, proptosis or lid lag. Conjunctiva clear and no icterus. No erythema or watering Neck: Trachea midline, neck supple without appreciable thyromegaly or lymphadenopathy and no palpable thyroid  nodules Lungs: Clear to auscultation, no wheeze. Respirations not labored Heart: S1S2, Regular in rate and rhythm.  Abdomen: Soft, non tender, non distended Neurologic: Alert, oriented, normal speech, deep tendon biceps reflexes normal,  no gross focal neurological deficit Extremities: No pedal pitting edema, no tremors of outstretched hands Skin: Warm, color good.  Psychiatric: Does not appear depressed or anxious  PERTINENT HISTORIC LABORATORY AND IMAGING STUDIES:  All pertinent laboratory results were reviewed. Please see HPI also for further details.   TSH  Date Value Ref Range Status  06/14/2023 0.00 (L) 0.35 - 5.50 uIU/mL Final  04/12/2023 0.00 Repeated and verified X2. (L) 0.35 - 5.50 uIU/mL Final  04/04/2023 <0.010 (L) 0.350 - 4.500 uIU/mL Final    Comment:    Performed by a 3rd Generation assay with a functional sensitivity of <=0.01 uIU/mL. Performed at [facility], [address] [place], [place] [address]     Lab Results  Component Value Date   FREET4 0.98 01/03/2024   FREET4 0.77 09/22/2021   FREET4 0.69 11/04/2020   T3FREE 3.8 09/22/2021   TSH 0.00 (L) 06/14/2023   TSH 0.00 Repeated and verified X2. (L) 04/12/2023   TSH <0.010 (L) 04/04/2023    No results found for: THYROTRECAB  Lab Results  Component Value Date   TSH 0.00 (L) 06/14/2023   TSH 0.00 Repeated and verified X2. (L) 04/12/2023   TSH <0.010 (L) 04/04/2023   FREET4 0.98 01/03/2024   FREET4 0.77 09/22/2021   FREET4 0.69 11/04/2020     No results found for: TSI   No components found for: TRAB    ASSESSMENT / PLAN  1. Hyperthyroidism    Patient has persistent low TSH with normal free T4 and free T3 consistent with  subclinical  hyperthyroidism.  Patient had history of hypothyroidism and used to be on thyroid  hormone replacement in the past, was stopped in November 2024 with persistent low TSH.  He remained low TSH consistent with hypothyroidism off of levothyroxine  for several months.  Patient has been losing weight however he reports he is intentional with diet improvement and exercise.  No obvious other hyperthyroid symptoms.  He had mildly elevated thyroglobulin and anti-TPO antibody with elevated TBII in June.  Etiology of hyperthyroidism is unclear at this time.  Discussed that it can be Graves' disease/autoimmune hypothyroidism.  Less likely to be thyroiditis.  Can also be related to hyperfunctioning thyroid  nodule.  Discussed that if it is Graves' disease will treat with antithyroid medication called methimazole, discussed potential side effects.   Plan: - Will check thyroid  function test including thyroid  and antibodies. -Rest of the plan based on test results. - Consider imaging studies including ultrasound neck/thyroid  and radioactive iodine  uptake and a scan if needed.  Diagnoses and all orders for this visit:  Hyperthyroidism -     T4, free -     T3, free -     Thyroglobulin antibody -     Thyroid  peroxidase antibody -     Thyroid  stimulating immunoglobulin -     TRAb (TSH Receptor Binding Antibody) -     TSH   DISPOSITION Follow up in clinic in 2 months suggested.  All questions answered and patient verbalized understanding of the plan.  Kyle Cyril Woodmansee, MD [facility] Endocrinology [facility] [address] [place], [address] [place], [place] [address] Phone # [phone]   At least part of this note was generated using voice recognition software. Inadvertent word errors may have occurred, which were not recognized during the proofreading process.

## 2024-04-17 ENCOUNTER — Telehealth: Payer: Self-pay

## 2024-04-17 ENCOUNTER — Ambulatory Visit: Payer: Self-pay | Admitting: Endocrinology

## 2024-04-17 DIAGNOSIS — E0590 Thyrotoxicosis, unspecified without thyrotoxic crisis or storm: Secondary | ICD-10-CM

## 2024-04-17 DIAGNOSIS — E0500 Thyrotoxicosis with diffuse goiter without thyrotoxic crisis or storm: Secondary | ICD-10-CM

## 2024-04-17 NOTE — Telephone Encounter (Signed)
 I am waiting on the results of other tests completed on that day for thyroid  autoantibodies.  Will make a plan after all the results are available.

## 2024-04-17 NOTE — Telephone Encounter (Signed)
 Patient's spouse called to discuss labs and next steps of treatment.

## 2024-04-18 LAB — THYROID STIMULATING IMMUNOGLOBULIN: TSI: 244 %{baseline} — ABNORMAL HIGH (ref <140)

## 2024-04-18 LAB — T4, FREE: Free T4: 2 ng/dL — ABNORMAL HIGH (ref 0.8–1.8)

## 2024-04-18 LAB — THYROID PEROXIDASE ANTIBODY: Thyroperoxidase Ab SerPl-aCnc: 29 [IU]/mL — ABNORMAL HIGH (ref <9)

## 2024-04-18 LAB — THYROGLOBULIN ANTIBODY: Thyroglobulin Ab: 7 [IU]/mL — ABNORMAL HIGH (ref <=1)

## 2024-04-18 LAB — TRAB (TSH RECEPTOR BINDING ANTIBODY): TRAB: 13.95 IU/L — ABNORMAL HIGH (ref <=2.00)

## 2024-04-18 LAB — T3, FREE: T3, Free: 7.2 pg/mL — ABNORMAL HIGH (ref 2.3–4.2)

## 2024-04-18 LAB — TSH: TSH: <0.01 m[IU]/L — ABNORMAL LOW (ref 0.40–4.50)

## 2024-04-18 MED ORDER — METHIMAZOLE 5 MG PO TABS: 10.0000 mg | ORAL_TABLET | Freq: Every day | ORAL | 3 refills | Status: AC

## 2024-04-26 ENCOUNTER — Ambulatory Visit (INDEPENDENT_AMBULATORY_CARE_PROVIDER_SITE_OTHER)

## 2024-04-26 DIAGNOSIS — Z23 Encounter for immunization: Secondary | ICD-10-CM

## 2024-05-17 ENCOUNTER — Other Ambulatory Visit: Payer: Self-pay | Admitting: Family Medicine

## 2024-06-06 ENCOUNTER — Encounter: Payer: Self-pay | Admitting: Cardiology

## 2024-06-06 ENCOUNTER — Ambulatory Visit: Attending: Cardiology | Admitting: Cardiology

## 2024-06-06 VITALS — BP 126/70 | HR 78 | Ht 71.0 in | Wt 200.6 lb

## 2024-06-06 DIAGNOSIS — I10 Essential (primary) hypertension: Secondary | ICD-10-CM

## 2024-06-06 MED ORDER — ATORVASTATIN CALCIUM 40 MG PO TABS
40.0000 mg | ORAL_TABLET | Freq: Every day | ORAL | 3 refills | Status: AC
Start: 2024-06-06 — End: ?

## 2024-06-06 MED ORDER — BENAZEPRIL-HYDROCHLOROTHIAZIDE 20-25 MG PO TABS: 0.5000 | ORAL_TABLET | Freq: Every day | ORAL | 3 refills | Status: DC

## 2024-06-06 MED ORDER — AMLODIPINE BESYLATE 5 MG PO TABS: 5.0000 mg | ORAL_TABLET | Freq: Every day | ORAL | 3 refills | Status: AC

## 2024-06-06 NOTE — Patient Instructions (Signed)

## 2024-06-06 NOTE — Progress Notes (Signed)
 Cardiology Office Note:  .   Date:  06/06/2024  ID:  Kyle [name], DOB 08/03/1946, MRN [id number] PCP: Micheal Wolm ORN, MD  [facility] Providers Cardiologist:  Oneil Parchment, MD     History of Present Illness: .   Kyle [name] is a 78 y.o. male Discussed the use of AI scribe  History of Present Illness Kyle [name] is a 78 year old male with coronary artery disease and COPD who presents for a cardiovascular follow-up.  He has a history of ST elevation myocardial infarction in 2011, treated with right coronary artery catheterization and stenting. A follow-up heart catheterization in 2017 showed a patent stent. He is currently on aspirin  81 mg and atorvastatin  40 mg, with an LDL of 53 as of September 7974.  He has COPD managed with Trelegy. He experiences exertional shortness of breath but is not affected by weather changes. He acknowledges not walking as much as he should but tries to stay active. He is followed by a pulmonologist.  He has a history of abdominal aortic aneurysm repair with an endograft and is followed by a vascular team.  He experienced a subsegmental pulmonary embolism in the right middle lobe branch in September 2024, with no history of deep vein thrombosis.  He has hypertension managed with amlodipine  5 mg, benazepril , and hydrochlorothiazide .  He has hyperlipidemia managed with atorvastatin  40 mg, with an LDL of 55.  He has a history of hyperthyroidism managed with methimazole , and his last TSH was 0.01.  He tries to stay active and avoid greasy and fried foods. He no longer plays golf but remains involved in community activities, such as helping his church.      Studies Reviewed: SABRA   EKG Interpretation Date/Time:  Tuesday June 06 2024 08:56:03 EST Ventricular Rate:  77 PR Interval:  154 QRS Duration:  102 QT Interval:  392 QTC Calculation: 443 R Axis:   66  Text Interpretation: Normal sinus rhythm Nonspecific ST  abnormality When compared with ECG of 15-Jul-2023 09:53, No significant change was found Confirmed by Parchment Oneil ([id number]) on 06/06/2024 9:05:13 AM    Results LABS LDL: 53 TSH: 0.01  DIAGNOSTIC EKG: Normal Echocardiogram: Normal systolic function Risk Assessment/Calculations:            Physical Exam:   VS:  BP 126/70   Pulse 78   Ht 5' 11 (1.803 m)   Wt 200 lb 9.6 oz (91 kg)   SpO2 94%   BMI 27.98 kg/m    Wt Readings from Last 3 Encounters:  06/06/24 200 lb 9.6 oz (91 kg)  04/13/24 210 lb 6.4 oz (95.4 kg)  03/29/24 197 lb 4.8 oz (89.5 kg)    GEN: Well nourished, well developed in no acute distress NECK: No JVD; No carotid bruits CARDIAC: RRR, no murmurs, no rubs, no gallops RESPIRATORY:  Clear to auscultation without rales, wheezing or rhonchi  ABDOMEN: Soft, non-tender, non-distended EXTREMITIES:  No edema; No deformity   ASSESSMENT AND PLAN: .    Assessment and Plan Assessment & Plan Coronary artery disease, post-stent placement Coronary artery disease with right coronary artery stent placement in 2011. Recent heart catheterization in 2017 showed patent stent. Current EKG is normal, and echocardiogram shows normal pump function. LDL is well-controlled at 53 mg/dL. - Continue aspirin  81 mg daily - Continue atorvastatin  40 mg daily  Chronic obstructive pulmonary disease (COPD) COPD managed with Trelegy. No significant impact from weather changes. Followed by pulmonologist. - Continue  Trelegy as prescribed  Abdominal aortic aneurysm, post-endograft repair Abdominal aortic aneurysm repair with endograft. Followed by vascular team.  History of pulmonary embolism, right middle lobe branch Subsegmental pulmonary embolism in the right middle lobe branch in September 2024. No DVT reported.  Essential hypertension Hypertension managed with amlodipine , benazepril , and hydrochlorothiazide  combination therapy. - Continue current antihypertensive  regimen  Hyperlipidemia Managed with atorvastatin . LDL is well-controlled at 53 mg/dL. - Continue atorvastatin  40 mg daily  Thyrotoxicosis, on methimazole  Thyrotoxicosis managed with methimazole . Last TSH was 0.01, indicating overactive thyroid . - Continue methimazole  as prescribed - Follow up with endocrinologist  DM type2  Previous hemoglobin A1c of 7.1%. No current medication for diabetes. Advised to monitor diet, particularly sweets intake. - Continue dietary monitoring and lifestyle modifications         Dispo: 1 yr  Signed, Oneil Parchment, MD

## 2024-06-07 DIAGNOSIS — G4733 Obstructive sleep apnea (adult) (pediatric): Secondary | ICD-10-CM | POA: Diagnosis not present

## 2024-06-14 ENCOUNTER — Ambulatory Visit: Admitting: Endocrinology

## 2024-06-19 ENCOUNTER — Ambulatory Visit (INDEPENDENT_AMBULATORY_CARE_PROVIDER_SITE_OTHER)

## 2024-06-19 VITALS — Ht 71.0 in | Wt 200.0 lb

## 2024-06-19 DIAGNOSIS — Z0000 Encounter for general adult medical examination without abnormal findings: Secondary | ICD-10-CM | POA: Diagnosis not present

## 2024-06-19 NOTE — Progress Notes (Signed)
 No chief complaint on file.    Subjective:   Kyle [name] is a 78 y.o. male who presents for a Medicare Annual Wellness Visit.  Allergies (verified) Patient has no known allergies.   History: Past Medical History:  Diagnosis Date   Anemia    CAD (coronary artery disease) 08/03/2009   Cardiac arrhythmia    COPD (chronic obstructive pulmonary disease) (HCC)    GERD (gastroesophageal reflux disease)    Hyperlipidemia    Hypertension    Hypothyroidism    Myocardial infarction (HCC) 08/03/2009   Sleep apnea treated with nocturnal BiPAP    Past Surgical History:  Procedure Laterality Date   ABDOMINAL AORTIC ENDOVASCULAR FENESTRATED STENT GRAFT N/A 07/20/2023   Procedure: ABDOMINAL AORTIC ENDOVASCULAR FENESTRATED STENT GRAFT;  Surgeon: [name], MD;  Location: [facility] OR;  Service: Vascular;  Laterality: N/A;   APPENDECTOMY     CARDIAC CATHETERIZATION N/A 05/05/2016   Procedure: Left Heart Cath and Coronary Angiography;  Surgeon: Oneil JAYSON Parchment, MD;  Location: [facility] INVASIVE CV LAB;  Service: Cardiovascular;  Laterality: N/A;   CORONARY ANGIOPLASTY WITH STENT PLACEMENT     EYE SURGERY     lasix /BIL   TONSILLECTOMY     ULTRASOUND GUIDANCE FOR VASCULAR ACCESS Bilateral 07/20/2023   Procedure: ULTRASOUND GUIDANCE FOR VASCULAR ACCESS, BILATERAL FEMORAL ARTERIES;  Surgeon: [name], MD;  Location: [facility] OR;  Service: Vascular;  Laterality: Bilateral;   Family History  Problem Relation Age of Onset   Stroke Mother    Hypertension Mother    Stroke Father    Hypotension Father    Multiple sclerosis Sister    Thyroid  disease Brother    Social History   Occupational History   Occupation: retired  Tobacco Use   Smoking status: Former    Current packs/day: 0.00    Average packs/day: 4.0 packs/day for 28.0 years (112.0 ttl pk-yrs)    Types: Cigarettes    Start date: 12/10/1965    Quit date: 12/10/1993    Years since quitting: 30.5   Smokeless tobacco:  Never  Vaping Use   Vaping status: Never Used  Substance and Sexual Activity   Alcohol use: Yes    Alcohol/week: 5.0 standard drinks of alcohol    Types: 5 Standard drinks or equivalent per week    Comment: once every 2 months   Drug use: No   Sexual activity: Not on file   Tobacco Counseling Counseling given: Not Answered  SDOH Screenings   Food Insecurity: No Food Insecurity (06/14/2024)  Housing: Low Risk  (06/14/2024)  Transportation Needs: No Transportation Needs (06/14/2024)  Utilities: Not At Risk (06/19/2024)  Alcohol Screen: Low Risk  (06/14/2024)  Depression (PHQ2-9): Low Risk  (06/19/2024)  Financial Resource Strain: Low Risk  (06/14/2024)  Physical Activity: Inactive (06/14/2024)  Social Connections: Socially Integrated (06/14/2024)  Stress: No Stress Concern Present (06/14/2024)  Tobacco Use: Medium Risk (06/19/2024)  Health Literacy: Adequate Health Literacy (06/19/2024)   See flowsheets for full screening details  Depression Screen PHQ 2 & 9 Depression Scale- Over the past 2 weeks, how often have you been bothered by any of the following problems? Little interest or pleasure in doing things: 0 Feeling down, depressed, or hopeless (PHQ Adolescent also includes...irritable): 0 PHQ-2 Total Score: 0 Trouble falling or staying asleep, or sleeping too much: 0 Feeling tired or having little energy: 0 Poor appetite or overeating (PHQ Adolescent also includes...weight loss): 0 Feeling bad about yourself - or that you are a  failure or have let yourself or your family down: 0 Trouble concentrating on things, such as reading the newspaper or watching television (PHQ Adolescent also includes...like school work): 0 Moving or speaking so slowly that other people could have noticed. Or the opposite - being so fidgety or restless that you have been moving around a lot more than usual: 0 Thoughts that you would be better off dead, or of hurting yourself in some way: 0 PHQ-9  Total Score: 0 If you checked off any problems, how difficult have these problems made it for you to do your work, take care of things at home, or get along with other people?: Not difficult at all  Depression Treatment Depression Interventions/Treatment : EYV7-0 Score <4 Follow-up Not Indicated     Goals Addressed               This Visit's Progress     Patient Stated (pt-stated)   On track     Would like to get back into the gym.       Visit info / Clinical Intake: Medicare Wellness Visit Type:: Subsequent Annual Wellness Visit Persons participating in visit:: patient Medicare Wellness Visit Mode:: Video Because this visit was a virtual/telehealth visit:: vitals recorded from last visit If Telephone or Video please confirm:: I connected with the patient using audio enabled telemedicine application and verified that I am speaking with the correct person using two identifiers; I discussed the limitations of evaluation and management by telemedicine; The patient expressed understanding and agreed to proceed Patient Location:: Home Provider Location:: Home Information given by:: patient Interpreter Needed?: No Pre-visit prep was completed: yes AWV questionnaire completed by patient prior to visit?: yes Date:: 06/14/24 Living arrangements:: lives with spouse/significant other Patient's Overall Health Status Rating: good Typical amount of pain: none Does pain affect daily life?: no Are you currently prescribed opioids?: no  Dietary Habits and Nutritional Risks How many meals a day?: 2 Eats fruit and vegetables daily?: (!) no Most meals are obtained by: preparing own meals In the last 2 weeks, have you had any of the following?: none Diabetic:: no  Functional Status Activities of Daily Living (to include ambulation/medication): (Proxy-Rptd) Independent Ambulation: Independent with device- listed below Home Assistive Devices/Equipment: Eyeglasses; CPAP Medication  Administration: Independent Home Management: (Proxy-Rptd) Independent Manage your own finances?: yes Primary transportation is: driving Concerns about vision?: no *vision screening is required for WTM* Concerns about hearing?: no  Fall Screening Falls in the past year?: (Proxy-Rptd) 0 Number of falls in past year: 0 Was there an injury with Fall?: 0 Fall Risk Category Calculator: 0 Patient Fall Risk Level: Low Fall Risk  Fall Risk Patient at Risk for Falls Due to: No Fall Risks Fall risk Follow up: Falls evaluation completed; Falls prevention discussed  Home and Transportation Safety: All rugs have non-skid backing?: yes All stairs or steps have railings?: (!) no Grab bars in the bathtub or shower?: yes Have non-skid surface in bathtub or shower?: yes Good home lighting?: yes Regular seat belt use?: yes Hospital stays in the last year:: (!) yes (In December) How many hospital stays:: 1 Reason: surgery  Cognitive Assessment Difficulty concentrating, remembering, or making decisions? : no Will 6CIT or Mini Cog be Completed: no 6CIT or Mini Cog Declined: patient alert, oriented, able to answer questions appropriately and recall recent events  Advance Directives (For Healthcare) Does Patient Have a Medical Advance Directive?: Yes Does patient want to make changes to medical advance directive?: No - Patient declined  Type of Advance Directive: Healthcare Power of [place]; Living will Copy of Healthcare Power of Attorney in Chart?: Yes - validated most recent copy scanned in chart (See row information)  Reviewed/Updated  Reviewed/Updated: Reviewed All (Medical, Surgical, Family, Medications, Allergies, Care Teams, Patient Goals)        Objective:    Today's Vitals   06/19/24 0916  Weight: 200 lb (90.7 kg)  Height: 5' 11 (1.803 m)   Body mass index is 27.89 kg/m.  Current Medications (verified) Outpatient Encounter Medications as of 06/19/2024  Medication Sig    albuterol  (VENTOLIN  HFA) 108 (90 Base) MCG/ACT inhaler Inhale 2 puffs into the lungs every 6 (six) hours as needed for wheezing or shortness of breath.   amLODipine  (NORVASC ) 5 MG tablet Take 1 tablet (5 mg total) by mouth daily.   aspirin  EC 81 MG tablet Take 1 tablet (81 mg total) by mouth daily at 6 (six) AM. Swallow whole.   atorvastatin  (LIPITOR) 40 MG tablet Take 1 tablet (40 mg total) by mouth daily.   benazepril -hydrochlorthiazide (LOTENSIN  HCT) 20-25 MG tablet Take 0.5 tablets by mouth daily.   cetirizine (ZYRTEC) 10 MG tablet Take 10 mg by mouth daily.   Cyanocobalamin  (B-12 PO) Take 1 tablet by mouth every other day.   Fluticasone -Umeclidin-Vilant (TRELEGY ELLIPTA ) 100-62.5-25 MCG/ACT AEPB Inhale 1 puff into the lungs daily.   methimazole  (TAPAZOLE ) 5 MG tablet Take 2 tablets (10 mg total) by mouth daily.   Multiple Vitamin (MULTIVITAMIN) tablet Take 1 tablet by mouth daily.   NON FORMULARY Pt uses bi-pap nightly   omeprazole  (PRILOSEC) 40 MG capsule Take 1 capsule (40 mg total) by mouth daily. Take by mouth.   valACYclovir  (VALTREX ) 1000 MG tablet Take 1 g by mouth daily.   No facility-administered encounter medications on file as of 06/19/2024.   Hearing/Vision screen Hearing Screening - Comments:: Denies hearing difficulties   Vision Screening - Comments:: Wears eyeglasses/Dr. Parker/UTD Immunizations and Health Maintenance Health Maintenance  Topic Date Due   Medicare Annual Wellness (AWV)  10/15/2023   COVID-19 Vaccine (8 - 2025-26 season) 04/03/2024   DTaP/Tdap/Td (4 - Td or Tdap) 08/24/2028   Pneumococcal Vaccine: 50+ Years  Completed   Influenza Vaccine  Completed   Hepatitis C Screening  Completed   Zoster Vaccines- Shingrix  Completed   Meningococcal B Vaccine  Aged Out   Colonoscopy  Discontinued        Assessment/Plan:  This is a routine wellness examination for Brinson.  Patient Care Team: Micheal Wolm ORN, MD as PCP - General (Family Medicine) Jeffrie Oneil BROCKS, MD as PCP - Cardiology (Cardiology)  I have personally reviewed and noted the following in the patient's chart:   Medical and social history Use of alcohol, tobacco or illicit drugs  Current medications and supplements including opioid prescriptions. Functional ability and status Nutritional status Physical activity Advanced directives List of other physicians Hospitalizations, surgeries, and ER visits in previous 12 months Vitals Screenings to include cognitive, depression, and falls Referrals and appointments  No orders of the defined types were placed in this encounter.  In addition, I have reviewed and discussed with patient certain preventive protocols, quality metrics, and best practice recommendations. A written personalized care plan for preventive services as well as general preventive health recommendations were provided to patient.   Renarda Mullinix L Rendell Thivierge, CMA   06/19/2024   No follow-ups on file.  After Visit Summary: (MyChart) Due to this being a telephonic visit, the after visit summary with  patients personalized plan was offered to patient via MyChart   Nurse Notes: Patient is up to date on all health maintenance with no concerns to address today.

## 2024-06-19 NOTE — Patient Instructions (Addendum)
 Mr. Kyle [name],  Thank you for taking the time for your Medicare Wellness Visit. I appreciate your continued commitment to your health goals. Please review the care plan we discussed, and feel free to reach out if I can assist you further.  Please note that Annual Wellness Visits do not include a physical exam. Some assessments may be limited, especially if the visit was conducted virtually. If needed, we may recommend an in-person follow-up with your provider.  Ongoing Care Seeing your primary care provider every 3 to 6 months helps us  monitor your health and provide consistent, personalized care. Last office visit on 03/29/2024.  Keep up the good work.  Referrals If a referral was made during today's visit and you haven't received any updates within two weeks, please contact the referred provider directly to check on the status.  Recommended Screenings:  Health Maintenance  Topic Date Due   Medicare Annual Wellness Visit  10/15/2023   COVID-19 Vaccine (8 - 2025-26 season) 04/03/2024   DTaP/Tdap/Td vaccine (4 - Td or Tdap) 08/24/2028   Pneumococcal Vaccine for age over 57  Completed   Flu Shot  Completed   Hepatitis C Screening  Completed   Zoster (Shingles) Vaccine  Completed   Meningitis B Vaccine  Aged Out   Colon Cancer Screening  Discontinued       06/14/2024    9:48 AM  Advanced Directives  Does Patient Have a Medical Advance Directive? Yes  Type of Estate Agent of [place];Living will    Vision: Annual vision screenings are recommended for early detection of glaucoma, cataracts, and diabetic retinopathy. These exams can also reveal signs of chronic conditions such as diabetes and high blood pressure.  Dental: Annual dental screenings help detect early signs of oral cancer, gum disease, and other conditions linked to overall health, including heart disease and diabetes.  Please see the attached documents for additional preventive care recommendations.

## 2024-06-20 ENCOUNTER — Ambulatory Visit: Admitting: Endocrinology

## 2024-06-28 ENCOUNTER — Ambulatory Visit: Admitting: Endocrinology

## 2024-06-28 ENCOUNTER — Other Ambulatory Visit

## 2024-06-28 ENCOUNTER — Other Ambulatory Visit: Payer: Self-pay | Admitting: Internal Medicine

## 2024-06-28 ENCOUNTER — Encounter: Payer: Self-pay | Admitting: Endocrinology

## 2024-06-28 VITALS — BP 132/70 | HR 67 | Resp 16 | Ht 71.0 in | Wt 202.4 lb

## 2024-06-28 DIAGNOSIS — E0500 Thyrotoxicosis with diffuse goiter without thyrotoxic crisis or storm: Secondary | ICD-10-CM | POA: Diagnosis not present

## 2024-06-28 DIAGNOSIS — E0590 Thyrotoxicosis, unspecified without thyrotoxic crisis or storm: Secondary | ICD-10-CM | POA: Diagnosis not present

## 2024-06-28 LAB — T4, FREE: Free T4: 1.2 ng/dL (ref 0.8–1.8)

## 2024-06-28 LAB — T3, FREE: T3, Free: 4 pg/mL (ref 2.3–4.2)

## 2024-06-28 LAB — TSH: TSH: 0.01 m[IU]/L — ABNORMAL LOW (ref 0.40–4.50)

## 2024-06-28 NOTE — Progress Notes (Signed)
 Outpatient Endocrinology Note Kyle Brothers, MD   Patient's Name: Kyle [name]    DOB: Nov 03, 1945    MRN: [id number]  REASON OF VISIT: Follow-up for hyperthyroidism  REFERRING PROVIDER: Micheal [name] ORN, MD  PCP: Kyle [name] ORN, MD  HISTORY OF PRESENT ILLNESS:   Kyle [name] is a 78 y.o. old male with past medical history as listed below is presented for follow-up for hyperthyroidism / Graves' disease.  Pertinent Thyroid  History: Patient was referred to endocrinology for further evaluation and management of thyroid  disorder / hyperthyroidism.  Initial consult on April 13, 2024.  Background: Patient has history of hypothyroidism was on thyroid  hormone replacement levothyroxine  25 to 75 mcg dose in the past.  Levothyroxine  was gradually decreased over time, with the lab results indicating hypothyroidism, and was completely stopped in November 2024.  He had TSH as high as 8.39. With off of levothyroxine  he continued to have low TSH, lab in January 13, 2024 suppressed TSH with normal free T3, normal free T4, total T3 normal.  Antibody tests at that time with mildly elevated antithyroglobulin and antithyroid peroxidase antibody and also elevated TSH receptor antibody TBII. Patient has not been on thyroid  medication.  He denies any illness around the time of laboratory test.  No recent IV iodine  contrast use.  No amiodarone use.  Family history of thyroid  disorder in brother.  He sometimes feels something stuck in the throat otherwise no neck related symptoms.  Denies palpitation or heat intolerance.  Denies extra sweating.  He lost about 40 pounds of weight in  year, he reports intentional with improvement on diet and regular exercise.  Denies change in bowel habit.  Initial visit in September 2025 showed elevated TRAb and TSI with elevated thyroid  hormone levels consistent with hyperthyroidism caused by Graves' disease.  Patient was started on methimazole  10 mg daily.  Interval  history: Patient has been taking methimazole  10 mg daily.  He has not felt any new symptoms.  Overall feeling in usual state of health.  Denies palpitation and heat intolerance.  No change in bowel habit.  He has gained about 2 pounds of weight in last 2 months.  No recent illness or fever or any GI issues.  No other complaints today.  REVIEW OF SYSTEMS:  As per history of present illness.   PAST MEDICAL HISTORY: Past Medical History:  Diagnosis Date   Anemia    CAD (coronary artery disease) 08/03/2009   Cardiac arrhythmia    COPD (chronic obstructive pulmonary disease) (HCC)    GERD (gastroesophageal reflux disease)    Hyperlipidemia    Hypertension    Hypothyroidism    Myocardial infarction (HCC) 08/03/2009   Sleep apnea treated with nocturnal BiPAP     PAST SURGICAL HISTORY: Past Surgical History:  Procedure Laterality Date   ABDOMINAL AORTIC ENDOVASCULAR FENESTRATED STENT GRAFT N/A 07/20/2023   Procedure: ABDOMINAL AORTIC ENDOVASCULAR FENESTRATED STENT GRAFT;  Surgeon: Kyle Penne Bruckner, MD;  Location: [facility];  Service: Vascular;  Laterality: N/A;   APPENDECTOMY     CARDIAC CATHETERIZATION N/A 05/05/2016   Procedure: Left Heart Cath and Coronary Angiography;  Surgeon: Kyle JAYSON Parchment, MD;  Location: [facility] INVASIVE CV LAB;  Service: Cardiovascular;  Laterality: N/A;   CORONARY ANGIOPLASTY WITH STENT PLACEMENT     EYE SURGERY     lasix /BIL   TONSILLECTOMY     ULTRASOUND GUIDANCE FOR VASCULAR ACCESS Bilateral 07/20/2023   Procedure: ULTRASOUND GUIDANCE FOR VASCULAR ACCESS, BILATERAL FEMORAL ARTERIES;  Surgeon:  Kyle Penne Bruckner, MD;  Location: [facility] OR;  Service: Vascular;  Laterality: Bilateral;    ALLERGIES: No Known Allergies  FAMILY HISTORY:  Family History  Problem Relation Age of Onset   Stroke Mother    Hypertension Mother    Stroke Father    Hypotension Father    Multiple sclerosis Sister    Thyroid  disease Brother     SOCIAL HISTORY: Social  History   Socioeconomic History   Marital status: Married    Spouse name: Kyle [name]   Number of children: 0   Years of education: Not on file   Highest education level: 12th grade  Occupational History   Occupation: retired  Tobacco Use   Smoking status: Former    Current packs/day: 0.00    Average packs/day: 4.0 packs/day for 28.0 years (112.0 ttl pk-yrs)    Types: Cigarettes    Start date: 12/10/1965    Quit date: 12/10/1993    Years since quitting: 30.5   Smokeless tobacco: Never  Vaping Use   Vaping status: Never Used  Substance and Sexual Activity   Alcohol use: Yes    Alcohol/week: 5.0 standard drinks of alcohol    Types: 5 Standard drinks or equivalent per week    Comment: once every 2 months   Drug use: No   Sexual activity: Not on file  Other Topics Concern   Not on file  Social History Narrative   Lives with wife/2025   Social Drivers of Health   Financial Resource Strain: Low Risk  (06/14/2024)   Overall Financial Resource Strain (CARDIA)    Difficulty of Paying Living Expenses: Not hard at all  Food Insecurity: No Food Insecurity (06/14/2024)   Hunger Vital Sign    Worried About Running Out of Food in the Last Year: Never true    Ran Out of Food in the Last Year: Never true  Transportation Needs: No Transportation Needs (06/14/2024)   PRAPARE - Administrator, Civil Service (Medical): No    Lack of Transportation (Non-Medical): No  Physical Activity: Inactive (06/14/2024)   Exercise Vital Sign    Days of Exercise per Week: 0 days    Minutes of Exercise per Session: Not on file  Stress: No Stress Concern Present (06/14/2024)   [employer] of Occupational Health - Occupational Stress Questionnaire    Feeling of Stress: Not at all  Social Connections: Socially Integrated (06/14/2024)   Social Connection and Isolation Panel    Frequency of Communication with Friends and Family: Twice a week    Frequency of Social Gatherings with Friends and  Family: Once a week    Attends Religious Services: More than 4 times per year    Active Member of [employer] or Organizations: Yes    Attends Engineer, Structural: More than 4 times per year    Marital Status: Married    MEDICATIONS:  Current Outpatient Medications  Medication Sig Dispense Refill   albuterol  (VENTOLIN  HFA) 108 (90 Base) MCG/ACT inhaler Inhale 2 puffs into the lungs every 6 (six) hours as needed for wheezing or shortness of breath. 6.7 g 11   amLODipine  (NORVASC ) 5 MG tablet Take 1 tablet (5 mg total) by mouth daily. 90 tablet 3   aspirin  EC 81 MG tablet Take 1 tablet (81 mg total) by mouth daily at 6 (six) AM. Swallow whole. 30 tablet 12   atorvastatin  (LIPITOR) 40 MG tablet Take 1 tablet (40 mg total) by mouth daily. 90 tablet 3  benazepril -hydrochlorthiazide (LOTENSIN  HCT) 20-25 MG tablet Take 0.5 tablets by mouth daily. 45 tablet 3   cetirizine (ZYRTEC) 10 MG tablet Take 10 mg by mouth daily.     Cyanocobalamin  (B-12 PO) Take 1 tablet by mouth every other day.     Fluticasone -Umeclidin-Vilant (TRELEGY ELLIPTA ) 100-62.5-25 MCG/ACT AEPB Inhale 1 puff into the lungs daily. 60 each 11   methimazole  (TAPAZOLE ) 5 MG tablet Take 2 tablets (10 mg total) by mouth daily. 180 tablet 3   Multiple Vitamin (MULTIVITAMIN) tablet Take 1 tablet by mouth daily.     NON FORMULARY Pt uses bi-pap nightly     omeprazole  (PRILOSEC) 40 MG capsule Take 1 capsule (40 mg total) by mouth daily. Take by mouth. 30 capsule 11   valACYclovir  (VALTREX ) 1000 MG tablet Take 1 g by mouth daily.     No current facility-administered medications for this visit.    PHYSICAL EXAM: Vitals:   06/28/24 0859 06/28/24 0900  BP: (!) 142/70 132/70  Pulse: 67   Resp: 16   SpO2: 95%   Weight: 202 lb 6.4 oz (91.8 kg)   Height: 5' 11 (1.803 m)    Body mass index is 28.23 kg/m.  Wt Readings from Last 3 Encounters:  06/28/24 202 lb 6.4 oz (91.8 kg)  06/19/24 200 lb (90.7 kg)  06/06/24 200 lb 9.6 oz (91  kg)    General: Well developed, well nourished male in no apparent distress.  HEENT: AT/[place], no external lesions. Hearing intact to the spoken word Eyes: EOMI. No stare, proptosis or lid lag. Conjunctiva clear and no icterus. No erythema or watering Neck: Trachea midline, neck supple without appreciable thyromegaly or lymphadenopathy and no palpable thyroid  nodules Lungs: Clear to auscultation, no wheeze. Respirations not labored Heart: S1S2, Regular in rate and rhythm.  Abdomen: Soft, non tender, non distended Neurologic: Alert, oriented, normal speech, deep tendon biceps reflexes normal,  no gross focal neurological deficit Extremities: No pedal pitting edema, no tremors of outstretched hands Skin: Warm, color good.  Psychiatric: Does not appear depressed or anxious  PERTINENT HISTORIC LABORATORY AND IMAGING STUDIES:  All pertinent laboratory results were reviewed. Please see HPI also for further details.   TSH  Date Value Ref Range Status  04/13/2024 <0.01 (L) 0.40 - 4.50 mIU/L Final  06/14/2023 0.00 (L) 0.35 - 5.50 uIU/mL Final  04/12/2023 0.00 Repeated and verified X2. (L) 0.35 - 5.50 uIU/mL Final    Lab Results  Component Value Date   FREET4 2.0 (H) 04/13/2024   FREET4 0.98 01/03/2024   FREET4 0.77 09/22/2021   T3FREE 7.2 (H) 04/13/2024   T3FREE 3.8 09/22/2021   TSH <0.01 (L) 04/13/2024   TSH 0.00 (L) 06/14/2023   TSH 0.00 Repeated and verified X2. (L) 04/12/2023    No results found for: [facility]  Lab Results  Component Value Date   TSH <0.01 (L) 04/13/2024   TSH 0.00 (L) 06/14/2023   TSH 0.00 Repeated and verified X2. (L) 04/12/2023   FREET4 2.0 (H) 04/13/2024   FREET4 0.98 01/03/2024   FREET4 0.77 09/22/2021     Lab Results  Component Value Date   TSI 244 (H) 04/13/2024     No components found for: TRAB     Latest Reference Range & Units 04/13/24 10:28  TSH 0.40 - 4.50 mIU/L <0.01 (L)  Triiodothyronine,Free,Serum 2.3 - 4.2 pg/mL 7.2 (H)   T4,Free(Direct) 0.8 - 1.8 ng/dL 2.0 (H)  Thyroglobulin Ab < or = 1 IU/mL 7 (H)  Thyroperoxidase Ab SerPl-aCnc <  9 IU/mL 29 (H)  Thyroid  stimulating immunoglobulin  Rpt !  TRAB <=2.00 IU/L 13.95 (H)  (L): Data is abnormally low (H): Data is abnormally high !: Data is abnormal Rpt: View report in Results Review for more information   Latest Reference Range & Units 04/12/23 11:26 06/14/23 07:51 01/03/24 12:07  TSH 0.35 - 5.50 uIU/mL 0.00 Repeated and verified X2. (L) 0.00 (L)   TSH uU/mL   <0.01 (L)  Triiodothyronine (T-3), Serum ng/dL   859  Free T-3 pg/mL   4.4  T4,Free(Direct) 0.60 - 1.60 ng/dL   9.01  Anti-Thyroglobulin Antibodies IU/mL   8.4 (H)  Free T4 by Dialysis ng/dL   1.3  Anti-Thyroid  Peroxidase Ab IU/mL   22 (H)  Reverse T3,  LCMS Endo Sci ng/dL   81.9  TSH Receptor Antibody (TBII) U/L   9.2  (L): Data is abnormally low (H): Data is abnormally high  ASSESSMENT / PLAN  1. Graves disease   2. Hyperthyroidism    Patient has hyperthyroidism secondary to Graves' disease, diagnosed in September 2025.  He has elevated TRAb and TSI.  He has a history of hypothyroidism treated with levothyroxine  in the past, was stopped in November 2024.  He also has elevated thyroid  peroxidase and thyroglobulin antibodies.  Methimazole  10 mg daily was started in September 2025.   Plan: -Will check thyroid  function test today and adjust the dose of methimazole  as needed.   Diagnoses and all orders for this visit:  Graves disease -     T4, free -     T3, free -     TSH  Hyperthyroidism    DISPOSITION Follow up in clinic in 3 months suggested.  Labs today and prior to follow-up visit.  All questions answered and patient verbalized understanding of the plan.  Anzel Kearse, MD [facility] Endocrinology [facility] [address] [place], [address] [place], [place] [address] Phone # [phone]   At least part of this note was generated using voice recognition software.  Inadvertent word errors may have occurred, which were not recognized during the proofreading process.

## 2024-06-29 ENCOUNTER — Other Ambulatory Visit: Payer: Self-pay | Admitting: Cardiology

## 2024-07-03 ENCOUNTER — Ambulatory Visit: Payer: Self-pay | Admitting: Endocrinology

## 2024-07-03 ENCOUNTER — Other Ambulatory Visit: Payer: Self-pay | Admitting: Endocrinology

## 2024-07-03 DIAGNOSIS — E0500 Thyrotoxicosis with diffuse goiter without thyrotoxic crisis or storm: Secondary | ICD-10-CM

## 2024-08-14 ENCOUNTER — Encounter: Payer: Self-pay | Admitting: Pulmonary Disease

## 2024-08-14 ENCOUNTER — Ambulatory Visit: Admitting: Pulmonary Disease

## 2024-08-14 VITALS — BP 134/68 | HR 77 | Temp 97.9°F | Ht 71.0 in | Wt 208.6 lb

## 2024-08-14 DIAGNOSIS — Z87891 Personal history of nicotine dependence: Secondary | ICD-10-CM

## 2024-08-14 DIAGNOSIS — Z86711 Personal history of pulmonary embolism: Secondary | ICD-10-CM

## 2024-08-14 DIAGNOSIS — I110 Hypertensive heart disease with heart failure: Secondary | ICD-10-CM

## 2024-08-14 DIAGNOSIS — G4733 Obstructive sleep apnea (adult) (pediatric): Secondary | ICD-10-CM

## 2024-08-14 DIAGNOSIS — J449 Chronic obstructive pulmonary disease, unspecified: Secondary | ICD-10-CM | POA: Diagnosis not present

## 2024-08-14 MED ORDER — TRELEGY ELLIPTA 100-62.5-25 MCG/ACT IN AEPB: 1.0000 | INHALATION_SPRAY | Freq: Every day | RESPIRATORY_TRACT | 11 refills | Status: AC

## 2024-08-14 NOTE — Progress Notes (Signed)
 "              Kyle [name]    [id number]    Dec 03, 1945  Primary Care Physician:Burchette, Wolm ORN, MD  Referring Physician: Micheal Wolm ORN, MD [address] [place],  [place] [address]  Chief complaint:   Patient with obstructive sleep apnea, obstructive lung disease  HPI:  Was last seen a year ago by Dr. Neysa In for follow-up today Tolerating BiPAP well Feels he may want to try a lower pressure Sleeps well, wakes up feeling rested No significant concerns with the BiPAP  Regarding obstructive lung disease has been on Trelegy Last time he tried to refill his Trelegy was about $500  Unsure whether it is really helping him but he continues to take it on a regular basis  He tries to stay active, does not keep a specific routine but does stay active Stop playing golf  Has lost some weight recently actively trying  Denies any pain or discomfort  Other medical history coronary artery disease, history of MI, hypertension history of PE on anticoagulation   Outpatient Encounter Medications as of 08/14/2024  Medication Sig   albuterol  (VENTOLIN  HFA) 108 (90 Base) MCG/ACT inhaler Inhale 2 puffs into the lungs every 6 (six) hours as needed for wheezing or shortness of breath.   amLODipine  (NORVASC ) 5 MG tablet Take 1 tablet (5 mg total) by mouth daily.   aspirin  EC 81 MG tablet Take 1 tablet (81 mg total) by mouth daily at 6 (six) AM. Swallow whole.   atorvastatin  (LIPITOR) 40 MG tablet Take 1 tablet (40 mg total) by mouth daily.   benazepril -hydrochlorthiazide (LOTENSIN  HCT) 20-25 MG tablet TAKE ONE-HALF TABLET BY MOUTH ONE TIME DAILY   cetirizine (ZYRTEC) 10 MG tablet Take 10 mg by mouth daily.   Cyanocobalamin  (B-12 PO) Take 1 tablet by mouth every other day.   methimazole  (TAPAZOLE ) 5 MG tablet Take 2 tablets (10 mg total) by mouth daily.   Multiple Vitamin (MULTIVITAMIN) tablet Take 1 tablet by mouth daily.   NON FORMULARY Pt uses bi-pap nightly   omeprazole   (PRILOSEC) 40 MG capsule Take 1 capsule (40 mg total) by mouth daily. Take by mouth.   TRELEGY ELLIPTA  100-62.5-25 MCG/ACT AEPB INHALE ONE PUFF BY MOUTH ONE TIME DAILY   valACYclovir  (VALTREX ) 1000 MG tablet Take 1 g by mouth daily.   No facility-administered encounter medications on file as of 08/14/2024.    Allergies as of 08/14/2024   (No Known Allergies)    Past Medical History:  Diagnosis Date   Anemia    CAD (coronary artery disease) 08/03/2009   Cardiac arrhythmia    COPD (chronic obstructive pulmonary disease) (HCC)    GERD (gastroesophageal reflux disease)    Hyperlipidemia    Hypertension    Hypothyroidism    Myocardial infarction (HCC) 08/03/2009   Sleep apnea treated with nocturnal BiPAP     Past Surgical History:  Procedure Laterality Date   ABDOMINAL AORTIC ENDOVASCULAR FENESTRATED STENT GRAFT N/A 07/20/2023   Procedure: ABDOMINAL AORTIC ENDOVASCULAR FENESTRATED STENT GRAFT;  Surgeon: [name], MD;  Location: [facility] OR;  Service: Vascular;  Laterality: N/A;   APPENDECTOMY     CARDIAC CATHETERIZATION N/A 05/05/2016   Procedure: Left Heart Cath and Coronary Angiography;  Surgeon: Oneil JAYSON Parchment, MD;  Location: [facility] INVASIVE CV LAB;  Service: Cardiovascular;  Laterality: N/A;   CORONARY ANGIOPLASTY WITH STENT PLACEMENT     EYE SURGERY     lasix ERBY  TONSILLECTOMY     ULTRASOUND GUIDANCE FOR VASCULAR ACCESS Bilateral 07/20/2023   Procedure: ULTRASOUND GUIDANCE FOR VASCULAR ACCESS, BILATERAL FEMORAL ARTERIES;  Surgeon: [name], MD;  Location: [facility] OR;  Service: Vascular;  Laterality: Bilateral;    Family History  Problem Relation Age of Onset   Stroke Mother    Hypertension Mother    Stroke Father    Hypotension Father    Multiple sclerosis Sister    Thyroid  disease Brother     Social History   Socioeconomic History   Marital status: Married    Spouse name: Arland   Number of children: 0   Years of education: Not on file    Highest education level: 12th grade  Occupational History   Occupation: retired  Tobacco Use   Smoking status: Former    Current packs/day: 0.00    Average packs/day: 4.0 packs/day for 28.0 years (112.0 ttl pk-yrs)    Types: Cigarettes    Start date: 12/10/1965    Quit date: 12/10/1993    Years since quitting: 30.6   Smokeless tobacco: Never  Vaping Use   Vaping status: Never Used  Substance and Sexual Activity   Alcohol use: Yes    Alcohol/week: 5.0 standard drinks of alcohol    Types: 5 Standard drinks or equivalent per week    Comment: once every 2 months   Drug use: No   Sexual activity: Not on file  Other Topics Concern   Not on file  Social History Narrative   Lives with wife/2025   Social Drivers of Health   Tobacco Use: Medium Risk (08/14/2024)   Patient History    Smoking Tobacco Use: Former    Smokeless Tobacco Use: Never    Passive Exposure: Not on file  Financial Resource Strain: Low Risk (06/14/2024)   Overall Financial Resource Strain (CARDIA)    Difficulty of Paying Living Expenses: Not hard at all  Food Insecurity: No Food Insecurity (06/14/2024)   Epic    Worried About Programme Researcher, Broadcasting/film/video in the Last Year: Never true    Ran Out of Food in the Last Year: Never true  Transportation Needs: No Transportation Needs (06/14/2024)   Epic    Lack of Transportation (Medical): No    Lack of Transportation (Non-Medical): No  Physical Activity: Inactive (06/14/2024)   Exercise Vital Sign    Days of Exercise per Week: 0 days    Minutes of Exercise per Session: Not on file  Stress: No Stress Concern Present (06/14/2024)   [employer] of Occupational Health - Occupational Stress Questionnaire    Feeling of Stress: Not at all  Social Connections: Socially Integrated (06/14/2024)   Social Connection and Isolation Panel    Frequency of Communication with Friends and Family: Twice a week    Frequency of Social Gatherings with Friends and Family: Once a week     Attends Religious Services: More than 4 times per year    Active Member of Clubs or Organizations: Yes    Attends Banker Meetings: More than 4 times per year    Marital Status: Married  Catering Manager Violence: Not At Risk (06/19/2024)   Epic    Fear of Current or Ex-Partner: No    Emotionally Abused: No    Physically Abused: No    Sexually Abused: No  Depression (PHQ2-9): Low Risk (06/19/2024)   Depression (PHQ2-9)    PHQ-2 Score: 0  Alcohol Screen: Low Risk (06/14/2024)   Alcohol Screen  Last Alcohol Screening Score (AUDIT): 1  Housing: Low Risk (06/14/2024)   Epic    Unable to Pay for Housing in the Last Year: No    Number of Times Moved in the Last Year: 0    Homeless in the Last Year: No  Utilities: Not At Risk (06/19/2024)   Epic    Threatened with loss of utilities: No  Health Literacy: Adequate Health Literacy (06/19/2024)   B1300 Health Literacy    Frequency of need for help with medical instructions: Never    Review of Systems  Respiratory:  Positive for apnea. Negative for shortness of breath.   Psychiatric/Behavioral:  Positive for sleep disturbance.     Vitals:   08/14/24 0834  BP: 134/68  Pulse: 77  Temp: 97.9 F (36.6 C)  SpO2: 95%     Physical Exam Constitutional:      Appearance: Normal appearance.  HENT:     Head: Normocephalic.     Nose: Nose normal.     Mouth/Throat:     Mouth: Mucous membranes are moist.  Eyes:     General: No scleral icterus.    Pupils: Pupils are equal, round, and reactive to light.  Cardiovascular:     Rate and Rhythm: Normal rate and regular rhythm.     Heart sounds: No murmur heard.    No friction rub.  Pulmonary:     Effort: No respiratory distress.     Breath sounds: No stridor. No wheezing or rhonchi.  Musculoskeletal:     Cervical back: No rigidity or tenderness.  Neurological:     Mental Status: He is alert.  Psychiatric:        Mood and Affect: Mood normal.    Data Reviewed: Last  sleep study was a home sleep study with an AHI of 19 an hour Last PFT was in 2018 with FEV1 of 49, TLC of 123, DLCO 56 Titrated to BiPAP in 2019 15/11  Current machine is about 78 years old, download from machine shows excellent compliance at 100% Average use of 7 hours 27 minutes On 15/11 Residual AHI of 5.4  Assessment/Plan: Gold 3 COPD based on PFT from February 2018 - On Trelegy - Albuterol  use as needed, rarely uses it  Obstructive sleep apnea on BiPAP from 2019 Current machine is about 78 years old - Tolerates it well - Benefits from its use - Wakes up rested, occasionally does take naps - Requesting pressures to be adjusted down since his lost about 30 pounds - Will trial pressure of 14/10, DME referral to be made for pressure changes - DME referral for BiPAP supplies  Encourage graded activities as tolerated  Other underlying medical problems include hypertension, past history of pulmonary embolism  Graded activities  Continue weight management efforts  Tentative follow-up a year from now  I personally spent a total of 32 minutes in the care of the patient today including preparing to see the patient, getting/reviewing separately obtained history, performing a medically appropriate exam/evaluation, counseling and educating, documenting clinical information in the EHR, and independently interpreting results.   Jennet Epley MD [facility] 08/14/2024, 8:50 AM  CC: Micheal Wolm ORN, MD   "

## 2024-08-14 NOTE — Patient Instructions (Signed)
 DME referral to change pressure from 15/11-14/10 Order for BiPAP supplies  Graded activities as tolerated  Continue using your BiPAP on a regular basis, download from the machine shows it is working well  Continue Trelegy  Important to keep weight off and exercise regularly  Follow-up a year from now  Call us  with significant concerns

## 2024-08-16 ENCOUNTER — Encounter: Payer: Self-pay | Admitting: Pulmonary Disease

## 2024-08-17 ENCOUNTER — Other Ambulatory Visit: Payer: Self-pay | Admitting: *Deleted

## 2024-08-17 DIAGNOSIS — Z9889 Other specified postprocedural states: Secondary | ICD-10-CM

## 2024-08-27 ENCOUNTER — Other Ambulatory Visit: Payer: Self-pay | Admitting: Cardiology

## 2024-09-06 ENCOUNTER — Ambulatory Visit (HOSPITAL_COMMUNITY)
Admission: RE | Admit: 2024-09-06 | Discharge: 2024-09-06 | Disposition: A | Source: Ambulatory Visit | Attending: Surgery | Admitting: Surgery

## 2024-09-06 ENCOUNTER — Encounter: Payer: Self-pay | Admitting: Vascular Surgery

## 2024-09-06 ENCOUNTER — Ambulatory Visit: Admitting: Vascular Surgery

## 2024-09-06 VITALS — BP 147/77 | HR 68 | Temp 98.0°F | Ht 71.0 in | Wt 206.0 lb

## 2024-09-06 DIAGNOSIS — Z9889 Other specified postprocedural states: Secondary | ICD-10-CM

## 2024-09-06 NOTE — Progress Notes (Signed)
 "  Patient ID: Kyle [name], male   DOB: [birth year], 79 y.o.   MRN: [id number]  Reason for Consult: Follow-up   Referred by Micheal Wolm ORN, MD  Subjective:     HPI:  Kyle [name] is a 79 y.o. male with history of fenestrated endovascular aneurysm repair.  Patient remains quite active although has not been active with the recent snowstorm.  At last visit he was noted to have stable sac size aneurysm with possible type II endoleak evaluated on CT.  He denies any new back or abdominal pain.  He is here for 1 year follow-up after aneurysm repair.  Past Medical History:  Diagnosis Date   Anemia    CAD (coronary artery disease) 08/03/2009   Cardiac arrhythmia    COPD (chronic obstructive pulmonary disease) (HCC)    GERD (gastroesophageal reflux disease)    Hyperlipidemia    Hypertension    Hypothyroidism    Myocardial infarction (HCC) 08/03/2009   Sleep apnea treated with nocturnal BiPAP    Family History  Problem Relation Age of Onset   Stroke Mother    Hypertension Mother    Stroke Father    Hypotension Father    Multiple sclerosis Sister    Thyroid  disease Brother    Past Surgical History:  Procedure Laterality Date   ABDOMINAL AORTIC ENDOVASCULAR FENESTRATED STENT GRAFT N/A 07/20/2023   Procedure: ABDOMINAL AORTIC ENDOVASCULAR FENESTRATED STENT GRAFT;  Surgeon: [name], MD;  Location: [facility] OR;  Service: Vascular;  Laterality: N/A;   APPENDECTOMY     CARDIAC CATHETERIZATION N/A 05/05/2016   Procedure: Left Heart Cath and Coronary Angiography;  Surgeon: Oneil JAYSON Parchment, MD;  Location: [facility] INVASIVE CV LAB;  Service: Cardiovascular;  Laterality: N/A;   CORONARY ANGIOPLASTY WITH STENT PLACEMENT     EYE SURGERY     lasix /BIL   TONSILLECTOMY     ULTRASOUND GUIDANCE FOR VASCULAR ACCESS Bilateral 07/20/2023   Procedure: ULTRASOUND GUIDANCE FOR VASCULAR ACCESS, BILATERAL FEMORAL ARTERIES;  Surgeon: [name], MD;  Location: [facility] OR;  Service:  Vascular;  Laterality: Bilateral;    Short Social History:  Social History   Tobacco Use   Smoking status: Former    Current packs/day: 0.00    Average packs/day: 4.0 packs/day for 28.0 years (112.0 ttl pk-yrs)    Types: Cigarettes    Start date: 12/10/1965    Quit date: 12/10/1993    Years since quitting: 30.7   Smokeless tobacco: Never  Substance Use Topics   Alcohol use: Not Currently    Alcohol/week: 5.0 standard drinks of alcohol    Types: 5 Standard drinks or equivalent per week    Allergies[1]  Current Outpatient Medications  Medication Sig Dispense Refill   albuterol  (VENTOLIN  HFA) 108 (90 Base) MCG/ACT inhaler Inhale 2 puffs into the lungs every 6 (six) hours as needed for wheezing or shortness of breath. 6.7 g 11   amLODipine  (NORVASC ) 5 MG tablet Take 1 tablet (5 mg total) by mouth daily. 90 tablet 3   aspirin  EC 81 MG tablet Take 1 tablet (81 mg total) by mouth daily at 6 (six) AM. Swallow whole. 30 tablet 12   atorvastatin  (LIPITOR) 40 MG tablet Take 1 tablet (40 mg total) by mouth daily. 90 tablet 3   benazepril -hydrochlorthiazide (LOTENSIN  HCT) 20-25 MG tablet TAKE ONE-HALF TABLET BY MOUTH ONE TIME DAILY 45 tablet 3   cetirizine (ZYRTEC) 10 MG tablet Take 10 mg by mouth daily.     Cyanocobalamin  (  B-12 PO) Take 1 tablet by mouth every other day.     Fluticasone -Umeclidin-Vilant (TRELEGY ELLIPTA ) 100-62.5-25 MCG/ACT AEPB Inhale 1 puff into the lungs daily. 60 each 11   methimazole  (TAPAZOLE ) 5 MG tablet Take 2 tablets (10 mg total) by mouth daily. 180 tablet 3   Multiple Vitamin (MULTIVITAMIN) tablet Take 1 tablet by mouth daily.     NON FORMULARY Pt uses bi-pap nightly     omeprazole  (PRILOSEC) 40 MG capsule TAKE ONE CAPSULE BY MOUTH ONE TIME DAILY 90 capsule 3   valACYclovir  (VALTREX ) 1000 MG tablet Take 1 g by mouth daily.     No current facility-administered medications for this visit.    Review of Systems  Constitutional:  Constitutional negative. HENT:  HENT negative.  Eyes: Eyes negative.  Respiratory: Respiratory negative.  Cardiovascular: Cardiovascular negative.  GI: Gastrointestinal negative.  Musculoskeletal: Musculoskeletal negative.  Skin: Skin negative.  Neurological: Neurological negative. Hematologic: Hematologic/lymphatic negative.  Psychiatric: Psychiatric negative.        Objective:  Objective   Vitals:   09/06/24 0901  BP: (!) 147/77  Pulse: 68  Temp: 98 F (36.7 C)  SpO2: 95%  Weight: 206 lb (93.4 kg)  Height: 5' 11 (1.803 m)   Body mass index is 28.73 kg/m.  Physical Exam HENT:     Head: Normocephalic.     Nose: Nose normal.  Eyes:     Pupils: Pupils are equal, round, and reactive to light.  Cardiovascular:     Pulses:          Popliteal pulses are 3+ on the right side and 3+ on the left side.  Pulmonary:     Effort: Pulmonary effort is normal.  Abdominal:     General: Abdomen is flat.     Palpations: Abdomen is soft. There is no mass.  Musculoskeletal:        General: Normal range of motion.     Right lower leg: No edema.     Left lower leg: No edema.  Skin:    General: Skin is warm.     Capillary Refill: Capillary refill takes less than 2 seconds.  Neurological:     General: No focal deficit present.     Mental Status: He is alert.  Psychiatric:        Mood and Affect: Mood normal.     Data: Endovascular Aortic Repair (EVAR):  +----------+----------------+-------------------+-------------------+           Diameter AP (cm)Diameter Trans (cm)Velocities (cm/sec)  +----------+----------------+-------------------+-------------------+  Aorta    4.39            4.77               119                  +----------+----------------+-------------------+-------------------+  Right Limb2.41            2.30               119                  +----------+----------------+-------------------+-------------------+  Left Limb 1.17            1.13               121                   +----------+----------------+-------------------+-------------------+   +-------------+---------------+  Endoleak TypeCannot exclude.  +-------------+---------------+       Summary:  Abdominal Aorta: The largest  aortic diameter has decreased compared to  prior exam. Previous diameter measurement was 5.8 x 5.6 cm. per CT angio  with type 2 endoleak. Dilated right common iliac artery.      Assessment/Plan:     79 year old male over 1 year out from fenestrated aneurysm repair.  Appears that his aortic sac diameter is decreasing with no evidence of endoleak today on duplex.  He continues to have prominent popliteal pulses although with previously normal duplex does not need further evaluation.  Will follow-up in 1 year with repeat EVAR duplex.     Penne Lonni Colorado MD [facility]      [1] No Known Allergies  "

## 2024-09-25 ENCOUNTER — Other Ambulatory Visit

## 2024-09-28 ENCOUNTER — Ambulatory Visit: Admitting: Endocrinology
